# Patient Record
Sex: Female | Born: 1980 | Hispanic: Yes | Marital: Single | Smoking: Never smoker
Health system: Southern US, Community
[De-identification: ages and names within clinical notes are randomized; demographics above are authoritative.]

## PROBLEM LIST (undated history)

## (undated) DIAGNOSIS — Z803 Family history of malignant neoplasm of breast: Secondary | ICD-10-CM

## (undated) DIAGNOSIS — M179 Osteoarthritis of knee, unspecified: Secondary | ICD-10-CM

## (undated) DIAGNOSIS — E669 Obesity, unspecified: Secondary | ICD-10-CM

## (undated) DIAGNOSIS — R87629 Unspecified abnormal cytological findings in specimens from vagina: Secondary | ICD-10-CM

## (undated) DIAGNOSIS — Z8049 Family history of malignant neoplasm of other genital organs: Secondary | ICD-10-CM

## (undated) DIAGNOSIS — S83512A Sprain of anterior cruciate ligament of left knee, initial encounter: Secondary | ICD-10-CM

## (undated) DIAGNOSIS — D219 Benign neoplasm of connective and other soft tissue, unspecified: Secondary | ICD-10-CM

## (undated) DIAGNOSIS — Z973 Presence of spectacles and contact lenses: Secondary | ICD-10-CM

## (undated) DIAGNOSIS — I10 Essential (primary) hypertension: Secondary | ICD-10-CM

## (undated) DIAGNOSIS — G43909 Migraine, unspecified, not intractable, without status migrainosus: Secondary | ICD-10-CM

## (undated) DIAGNOSIS — M1710 Unilateral primary osteoarthritis, unspecified knee: Secondary | ICD-10-CM

## (undated) DIAGNOSIS — T7840XA Allergy, unspecified, initial encounter: Secondary | ICD-10-CM

## (undated) DIAGNOSIS — K589 Irritable bowel syndrome without diarrhea: Secondary | ICD-10-CM

## (undated) DIAGNOSIS — E282 Polycystic ovarian syndrome: Secondary | ICD-10-CM

## (undated) DIAGNOSIS — Z8481 Family history of carrier of genetic disease: Secondary | ICD-10-CM

## (undated) DIAGNOSIS — S83207A Unspecified tear of unspecified meniscus, current injury, left knee, initial encounter: Secondary | ICD-10-CM

## (undated) DIAGNOSIS — K219 Gastro-esophageal reflux disease without esophagitis: Secondary | ICD-10-CM

## (undated) DIAGNOSIS — M1990 Unspecified osteoarthritis, unspecified site: Secondary | ICD-10-CM

## (undated) DIAGNOSIS — E559 Vitamin D deficiency, unspecified: Secondary | ICD-10-CM

## (undated) DIAGNOSIS — Z8742 Personal history of other diseases of the female genital tract: Secondary | ICD-10-CM

## (undated) DIAGNOSIS — Z1371 Encounter for nonprocreative screening for genetic disease carrier status: Secondary | ICD-10-CM

## (undated) DIAGNOSIS — Z8619 Personal history of other infectious and parasitic diseases: Secondary | ICD-10-CM

## (undated) HISTORY — DX: Unspecified osteoarthritis, unspecified site: M19.90

## (undated) HISTORY — DX: Irritable bowel syndrome, unspecified: K58.9

## (undated) HISTORY — DX: Encounter for nonprocreative screening for genetic disease carrier status: Z13.71

## (undated) HISTORY — DX: Family history of malignant neoplasm of breast: Z80.3

## (undated) HISTORY — DX: Family history of carrier of genetic disease: Z84.81

## (undated) HISTORY — DX: Benign neoplasm of connective and other soft tissue, unspecified: D21.9

## (undated) HISTORY — DX: Allergy, unspecified, initial encounter: T78.40XA

## (undated) HISTORY — DX: Family history of malignant neoplasm of other genital organs: Z80.49

## (undated) HISTORY — PX: CERVICAL BIOPSY  W/ LOOP ELECTRODE EXCISION: SUR135

## (undated) HISTORY — DX: Vitamin D deficiency, unspecified: E55.9

## (undated) HISTORY — DX: Gastro-esophageal reflux disease without esophagitis: K21.9

## (undated) HISTORY — DX: Obesity, unspecified: E66.9

## (undated) HISTORY — DX: Unspecified abnormal cytological findings in specimens from vagina: R87.629

## (undated) HISTORY — DX: Essential (primary) hypertension: I10

## (undated) HISTORY — DX: Polycystic ovarian syndrome: E28.2

## (undated) HISTORY — PX: WISDOM TOOTH EXTRACTION: SHX21

---
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
## 2003-11-11 ENCOUNTER — Emergency Department (HOSPITAL_COMMUNITY): Admission: EM | Admit: 2003-11-11 | Discharge: 2003-11-11 | Payer: Self-pay | Admitting: *Deleted

## 2004-08-24 ENCOUNTER — Ambulatory Visit (HOSPITAL_COMMUNITY): Admission: RE | Admit: 2004-08-24 | Discharge: 2004-08-24 | Payer: Self-pay | Admitting: Obstetrics and Gynecology

## 2004-11-14 ENCOUNTER — Inpatient Hospital Stay (HOSPITAL_COMMUNITY): Admission: AD | Admit: 2004-11-14 | Discharge: 2004-11-18 | Payer: Self-pay | Admitting: Obstetrics and Gynecology

## 2004-11-24 ENCOUNTER — Emergency Department (HOSPITAL_COMMUNITY): Admission: EM | Admit: 2004-11-24 | Discharge: 2004-11-25 | Payer: Self-pay | Admitting: *Deleted

## 2004-12-20 ENCOUNTER — Ambulatory Visit (HOSPITAL_COMMUNITY): Admission: RE | Admit: 2004-12-20 | Discharge: 2004-12-20 | Payer: Self-pay | Admitting: General Surgery

## 2004-12-20 ENCOUNTER — Encounter (INDEPENDENT_AMBULATORY_CARE_PROVIDER_SITE_OTHER): Payer: Self-pay | Admitting: General Surgery

## 2004-12-20 HISTORY — PX: LAPAROSCOPIC CHOLECYSTECTOMY: SUR755

## 2004-12-28 ENCOUNTER — Emergency Department (HOSPITAL_COMMUNITY): Admission: EM | Admit: 2004-12-28 | Discharge: 2004-12-29 | Payer: Self-pay | Admitting: Emergency Medicine

## 2005-04-11 ENCOUNTER — Other Ambulatory Visit: Admission: RE | Admit: 2005-04-11 | Discharge: 2005-04-11 | Payer: Self-pay | Admitting: Obstetrics and Gynecology

## 2005-05-18 ENCOUNTER — Other Ambulatory Visit: Admission: RE | Admit: 2005-05-18 | Discharge: 2005-05-18 | Payer: Self-pay | Admitting: Obstetrics and Gynecology

## 2006-11-21 ENCOUNTER — Other Ambulatory Visit: Admission: RE | Admit: 2006-11-21 | Discharge: 2006-11-21 | Payer: Self-pay | Admitting: Obstetrics and Gynecology

## 2006-11-22 ENCOUNTER — Ambulatory Visit (HOSPITAL_COMMUNITY): Admission: RE | Admit: 2006-11-22 | Discharge: 2006-11-22 | Payer: Self-pay | Admitting: Obstetrics and Gynecology

## 2007-09-04 ENCOUNTER — Encounter: Admission: RE | Admit: 2007-09-04 | Discharge: 2007-09-04 | Payer: Self-pay | Admitting: Obstetrics and Gynecology

## 2008-01-04 ENCOUNTER — Emergency Department (HOSPITAL_COMMUNITY): Admission: EM | Admit: 2008-01-04 | Discharge: 2008-01-04 | Payer: Self-pay | Admitting: Pediatrics

## 2008-04-06 ENCOUNTER — Other Ambulatory Visit: Admission: RE | Admit: 2008-04-06 | Discharge: 2008-04-06 | Payer: Self-pay | Admitting: Obstetrics & Gynecology

## 2008-07-11 ENCOUNTER — Emergency Department (HOSPITAL_COMMUNITY): Admission: EM | Admit: 2008-07-11 | Discharge: 2008-07-11 | Payer: Self-pay | Admitting: Emergency Medicine

## 2008-10-08 ENCOUNTER — Emergency Department (HOSPITAL_COMMUNITY): Admission: EM | Admit: 2008-10-08 | Discharge: 2008-10-08 | Payer: Self-pay | Admitting: Emergency Medicine

## 2009-04-24 ENCOUNTER — Emergency Department (HOSPITAL_COMMUNITY): Admission: EM | Admit: 2009-04-24 | Discharge: 2009-04-24 | Payer: Self-pay | Admitting: Emergency Medicine

## 2009-12-28 ENCOUNTER — Other Ambulatory Visit: Admission: RE | Admit: 2009-12-28 | Discharge: 2009-12-28 | Payer: Self-pay | Admitting: Obstetrics and Gynecology

## 2010-05-31 LAB — URINALYSIS, ROUTINE W REFLEX MICROSCOPIC
Nitrite: NEGATIVE
Protein, ur: NEGATIVE mg/dL
Urobilinogen, UA: 0.2 mg/dL (ref 0.0–1.0)

## 2010-05-31 LAB — WET PREP, GENITAL: Yeast Wet Prep HPF POC: NONE SEEN

## 2010-05-31 LAB — URINE MICROSCOPIC-ADD ON

## 2010-05-31 LAB — GC/CHLAMYDIA PROBE AMP, GENITAL: Chlamydia, DNA Probe: NEGATIVE

## 2010-05-31 LAB — PREGNANCY, URINE: Preg Test, Ur: NEGATIVE

## 2010-06-18 LAB — DIFFERENTIAL
Basophils Relative: 0 % (ref 0–1)
Eosinophils Absolute: 0 10*3/uL (ref 0.0–0.7)
Eosinophils Relative: 0 % (ref 0–5)
Monocytes Absolute: 0.5 10*3/uL (ref 0.1–1.0)
Monocytes Relative: 5 % (ref 3–12)

## 2010-06-18 LAB — COMPREHENSIVE METABOLIC PANEL
ALT: 70 U/L — ABNORMAL HIGH (ref 0–35)
Albumin: 4.1 g/dL (ref 3.5–5.2)
Alkaline Phosphatase: 52 U/L (ref 39–117)
Potassium: 3.8 mEq/L (ref 3.5–5.1)
Sodium: 139 mEq/L (ref 135–145)
Total Protein: 7.5 g/dL (ref 6.0–8.3)

## 2010-06-18 LAB — CBC
Hemoglobin: 12.6 g/dL (ref 12.0–15.0)
Platelets: 258 10*3/uL (ref 150–400)
RDW: 14.2 % (ref 11.5–15.5)

## 2010-07-02 ENCOUNTER — Emergency Department (HOSPITAL_COMMUNITY)
Admission: EM | Admit: 2010-07-02 | Discharge: 2010-07-02 | Disposition: A | Discharge status: Home / Self Care | Payer: Medicaid Other | Attending: Emergency Medicine | Admitting: Emergency Medicine

## 2010-07-02 DIAGNOSIS — N390 Urinary tract infection, site not specified: Secondary | ICD-10-CM | POA: Insufficient documentation

## 2010-07-02 DIAGNOSIS — G43909 Migraine, unspecified, not intractable, without status migrainosus: Secondary | ICD-10-CM | POA: Insufficient documentation

## 2010-07-02 LAB — URINALYSIS, ROUTINE W REFLEX MICROSCOPIC
Glucose, UA: NEGATIVE mg/dL
pH: 6 (ref 5.0–8.0)

## 2010-07-02 LAB — URINE MICROSCOPIC-ADD ON

## 2010-07-28 NOTE — H&P (Signed)
NAMESOREN, PIGMAN NO.:  [id number]   MEDICAL RECORD NO.:  [id number]          PATIENT TYPE:  OIB   LOCATION:  LDR3                          FACILITY:  [facility]   PHYSICIAN:  Lazaro Arms, M.D.   DATE OF BIRTH:  [birth year]   DATE OF ADMISSION:  11/14/2004  DATE OF DISCHARGE:  LH                                HISTORY & PHYSICAL   Kathy White came in for her regularly scheduled prenatal visit today and was noted  to have elevated blood pressure and proteinuria. She has been normotensive  throughout the pregnancy and only had trace protein in her urine until  today.  Blood pressures have typically been 100s-130/70s-80s.  She had blood  pressure in the office of 142/100, blood pressures here at the hospital 170s  over anywhere from 105 to 122.  Urine dip in the office was 3+ protein.  She  has 2+ pitting edema with 2-3+ DTRs.  She denies any headache or visual  changes.   PAST MEDICAL HISTORY:  Positive for HPV.   PAST SURGICAL HISTORY:  Wisdom teeth removed.   No known drug allergies.   SOCIAL HISTORY:  Works third shift.  I am not sure where.   FAMILY HISTORY:  Breast cancer and diabetes.   PRENATAL LABS:  Blood type A positive, rubella immune, HB, SAG, HIV, RPR,  gonorrhea, chlamydia and group B strep are all negative.  One-hour GTT was  normal at 120.  She is negative for type I and II HSV.  Last week's  gonorrhea and chlamydia were also negative.   PHYSICAL EXAMINATION:  HEENT:  Within normal limits.  No visual changes or  headache specifically.  HEART:  Regular rate and rhythm.  LUNGS:  Clear.  ABDOMEN:  Soft and nontender with a 35-cm fundal height.  Fetal heart rate  is reactive without deceleration.  PELVIC:  Cervix is tight fingertip, very long, minus one station, vertex  presentation.  EXTREMITIES:  DTRs are 2-3+ with 2+ edema halfway to the knee.   IMPRESSION:  Intrauterine pregnancy at 36 weeks 1 day, probable  preeclampsia.   PLAN:  Dr. Despina Hidden  was consulted.  We are starting her on magnesium sulfate for  seizure prophylaxis.  We are going to try to get her blood pressure down  with IV labetalol.  A 24-hour urine was started at 10:30 in the morning, and  other labs are pending.      Jacklyn Shell, C.N.M.      Lazaro Arms, M.D.  Electronically Signed    FC/MEDQ  D:  11/14/2004  T:  11/14/2004  Job:  [id number]   cc:   [facility]

## 2010-07-28 NOTE — Op Note (Signed)
NAMEMARIANA, Kathy White               ACCOUNT NO.:  [id number]   MEDICAL RECORD NO.:  [id number]          PATIENT TYPE:  INP   LOCATION:  A412                          FACILITY:  [facility]   PHYSICIAN:  Lazaro Arms, M.D.   DATE OF BIRTH:  Nov 29, 1980   DATE OF PROCEDURE:  11/14/2004  DATE OF DISCHARGE:                                 OPERATIVE REPORT   PREOPERATIVE DIAGNOSES:  1.  Intrauterine pregnancy at 36-1/[redacted] weeks gestation.  2.  Severe preeclampsia.  3.  Unfavorable cervix, remote from delivery.   POSTOPERATIVE DIAGNOSES:  1.  Intrauterine pregnancy at 36-1/[redacted] weeks gestation.  2.  Severe preeclampsia.  3.  Unfavorable cervix, remote from delivery.   OPERATION/PROCEDURE:  Primary low transverse cesarean section.   SURGEON:  Lazaro Arms, M.D.   ANESTHESIA:  Spinal.   FINDINGS:  [name] is a 30 year old female, G1, P0, who was admitted earlier  today with elevated blood pressure and 3+ protein on dip urine.  She was  placed on magnesium sulfate seizure prophylaxis.  A 24-hour urine was begun  and labs were drawn.  Her blood pressure was elevated at 160/110 in the  office but here after a couple of doses of labetalol, it settled down pretty  well and was acceptable.  However, between about 1800 and 2100 this evening,  her blood pressures continued to be elevated and no longer responded to  labetalol.  As a result, with her cervix being woefully unfavorable, being  nulliparous, and remote from delivery, probably at least 12 to 16 hours from  delivery, I decided the best course of action was to go ahead and perform a  primary cesarean section.  I informed the patient of this and she concurred.  Over a low transverse hysterotomy incision was delivered a viable female at  2252 with Apgars of 9 and 9 with weight 4-1/2 pounds.  There was three-  vessel cord.  Cord blood and cod gas was sent.  There was adequate fluid but  certainly not anything excessive.  Her placenta was aged, grade  3.  The  uterus, tubes and ovaries were otherwise normal.  Dr. Milinda Cave was present  for routine neonatal resuscitation.   DESCRIPTION OF PROCEDURE:  The patient was taken to the operating room,  placed in the sitting position where he underwent a spinal anesthetic.  She  was then placed in the supine position with a roll under her right hip.  She  was shaved-prepped with an Land.  A Foley was placed.  She was  prepped and draped in the usual sterile fashion.   A Pfannenstiel skin incision was made and carried down sharply to the rectus  fascia which was scored in the midline and extended laterally.  The fascia  was taken off the muscles, superiorly and inferiorly, without difficulty.  The muscles were divided.  The peritoneal cavity was entered.  A bladder  blade was placed.  The vesicouterine serosa flap was created.  A low  transverse hysterotomy incision was made.  Over this incision was delivered  a viable female infant at  2352 with  Apgars of  9 and 9, weighing 4 pounds and  8 ounces. There was a three-vessel cord. Cord blood and cord gas were sent.  Placenta was sent to pathology for routine evaluation.  The uterus was  exteriorized, wiped clean with clean lap pad.  The uterus was closed in two  layers, first being a running interlocking layer, the second being an  imbricating layer.  There was good hemostasis.  The uterus was replaced in  the peritoneal cavity.  Both pericolic gutters were irrigated and the  uterine incision was found to be hemostatic.  Muscles were reapproximated  loosely.  The fascia was closed using 0 Vicryl running.  Subcutaneous tissue  was made hemostatic and irrigated.  Skin was closed using skin staples.  The  patient tolerated the procedure well.  She experienced about 500 mL of blood  loss and was taken to the recovery room in good stable condition.  All  counts were correct x3.      Lazaro Arms, M.D.  Electronically Signed      LHE/MEDQ  D:  11/14/2004  T:  11/15/2004  Job:  [id number]

## 2010-07-28 NOTE — H&P (Signed)
Kathy White, Kathy White               ACCOUNT NO.:  [id number]   MEDICAL RECORD NO.:  [id number]          PATIENT TYPE:  AMB   LOCATION:  DAY                           FACILITY:  [facility]   PHYSICIAN:  Dalia Heading, M.D.  DATE OF BIRTH:  [birth year]   DATE OF ADMISSION:  12/25/2004  DATE OF DISCHARGE:  LH                                HISTORY & PHYSICAL   CHIEF COMPLAINT:  Cholecystitis, cholelithiasis.   HISTORY OF PRESENT ILLNESS:  The patient is a 30 year old white female who  is referred for evaluation and treatment of biliary colic secondary to  cholelithiasis.  She was pregnant while she was having right upper quadrant  abdominal pain with radiation to the right flank, nausea, and bloating.  She  has since delivered her baby and would like to proceed with surgery.   PAST MEDICAL HISTORY:  Unremarkable.   PAST SURGICAL HISTORY:  Unremarkable.   CURRENT MEDICATIONS:  None.   ALLERGIES:  No known drug allergies.   REVIEW OF SYSTEMS:  Noncontributory.   PHYSICAL EXAMINATION:  GENERAL:  The patient is a well-developed, well-  nourished, white female in no acute distress.  HEENT:  No scleral icterus.  LUNGS:  Clear to auscultation with equal breath sounds bilaterally.  HEART:  Regular rate and rhythm without S3, S4, or murmurs.  ABDOMEN:  Soft, nontender, and nondistended.  No hepatosplenomegaly or  masses are noted.   Ultrasound of the gallbladder reveals cholelithiasis without evidence of  cholecystitis.   IMPRESSION:  Cholelithiasis.   PLAN:  The patient is scheduled for laparoscopic cholecystectomy on December 25, 2004.  The risks and benefits of the procedure including bleeding,  infection, hepatobiliary injury, and the possibility of an open procedure  were fully explained to the patient, who gave informed consent.      Dalia Heading, M.D.  Electronically Signed     MAJ/MEDQ  D:  12/19/2004  T:  12/19/2004  Job:  [id number]   cc:   Short Stay, [facility]   Tilda Burrow, M.D.  Fax: [phone]

## 2010-07-28 NOTE — Op Note (Signed)
NAME:  Kathy White, Kathy White NO.:  [id number]   MEDICAL RECORD NO.:  [id number]          PATIENT TYPE:  EMS   LOCATION:  ED                            FACILITY:  [facility]   PHYSICIAN:  Langley Gauss, MD     DATE OF BIRTH:  [birth year]   DATE OF PROCEDURE:  11/24/2004  DATE OF DISCHARGE:  11/25/2004                                 OPERATIVE REPORT   TELEPHONE CONVERSATION:  Date of contact was November 24, 2004. The patient  receives care from [facility], contacted by the nursing staff on the  fourth floor regarding Latresa Biller who was status post low transverse  cesarean section performed by Dr. Despina Hidden on November 20, 2004, presumably for  emergency. The patient states that she was seen in their office on November 24, 2004 at which time a hematoma was evacuated and packing was placed. The  patient did not give any history of what plans for follow-up were regarding  this. When I contacted promptly, she stated that she was noticing blood  oozing from beneath the dressing. I thought this was excessive. With the  concern for ongoing active bleeding, I advised the patient to present to  emergency room for evaluation. Subsequently did not have any contact from  the emergency room. Thus, I cannot make a judgment as to whether she  presented to the emergency room for evaluation, nor do I have any  information regarding ongoing plans for care.      Langley Gauss, MD  Electronically Signed     DC/MEDQ  D:  11/25/2004  T:  11/25/2004  Job:  [id number]

## 2010-07-28 NOTE — Op Note (Signed)
Kathy White, Kathy White               ACCOUNT NO.:  [id number]   MEDICAL RECORD NO.:  [id number]          PATIENT TYPE:  AMB   LOCATION:  DAY                           FACILITY:  [facility]   PHYSICIAN:  Dalia Heading, M.D.  DATE OF BIRTH:  1980/12/04   DATE OF PROCEDURE:  12/20/2004  DATE OF DISCHARGE:                                 OPERATIVE REPORT   PREOPERATIVE DIAGNOSIS:  Right cholecystitis, cholelithiasis.   POSTOPERATIVE DIAGNOSIS:  Right cholecystitis, cholelithiasis.   PROCEDURE:  Laparoscopic cholecystectomy.   SURGEON:  Dr. Franky Macho.   ASSISTANT:  Dr. Arna Snipe.   ANESTHESIA:  General endotracheal.   INDICATIONS:  The patient is a 30 year old white female who presents with  cholecystitis secondary cholelithiasis. Risks and benefits of the procedure  including bleeding, infection, hepatobiliary injury, and the possibility of  an open procedure were fully explained to the patient, who gave informed  consent.   PROCEDURE NOTE:  The patient was placed in the supine position. After  induction of general endotracheal anesthesia, the abdomen was prepped and  draped using the usual sterile technique with Betadine. Surgical site  confirmation was performed.   An infraumbilical incision was made down to the fascia. Veress needle was  introduced into the abdominal cavity, and confirmation of placement was done  using the saline drop test. The abdomen was then insufflated to 16 mmHg  pressure. An 11-mm trocar was introduced into the abdominal cavity under  direct visualization without difficulty. The patient was placed in reversed  Trendelenburg position. An additional 11-mm trocar was placed in epigastric  region, and 5-mm trocars were placed in the right upper quadrant and right  flank regions. Liver was inspected and noted to normal limits. The  gallbladder was retracted superiorly and laterally. Dissection was begun  around the infundibulum of the gallbladder. The  cystic duct was first  identified. Its juncture to the infundibulum fully identified. Endoclips  were placed proximally and distally on the cystic duct, and the cystic duct  was divided. This was likewise done to the cystic artery. The gallbladder  was then freed away from the gallbladder fossa using Bovie electrocautery.  The gallbladder was delivered through the epigastric trocar site using an  EndoCatch bag. The gallbladder fossa was inspected, and no abnormal bleeding  or bile leakage was noted. Surgicel was placed in the gallbladder fossa. All  fluid and air were then evacuated from the abdominal cavity prior to removal  of the trocars.   All wounds were irrigated with normal saline. All wounds were injected with  0.5% Sensorcaine. The infraumbilical fascia was reapproximated using a 0  Vicryl interrupted suture. All skin incisions were closed using staples.  Betadine ointment and dry sterile dressings were applied.   All tape and needle counts were correct at the end of the procedure. The  patient was extubated in the operating room and went back to recovery room  awake in stable condition.   COMPLICATIONS:  None.   SPECIMEN:  Gallbladder with stones.   BLOOD LOSS:  Minimal.      Dalia Heading,  M.D.  Electronically Signed     MAJ/MEDQ  D:  12/20/2004  T:  12/20/2004  Job:  [id number]   cc:   Tilda Burrow, M.D.  Fax: [phone]

## 2010-09-14 ENCOUNTER — Emergency Department (HOSPITAL_COMMUNITY)
Admission: EM | Admit: 2010-09-14 | Discharge: 2010-09-14 | Disposition: A | Discharge status: Home / Self Care | Payer: Medicaid Other | Attending: Emergency Medicine | Admitting: Emergency Medicine

## 2010-09-14 DIAGNOSIS — L089 Local infection of the skin and subcutaneous tissue, unspecified: Secondary | ICD-10-CM | POA: Insufficient documentation

## 2010-09-14 DIAGNOSIS — Y92009 Unspecified place in unspecified non-institutional (private) residence as the place of occurrence of the external cause: Secondary | ICD-10-CM | POA: Insufficient documentation

## 2010-11-27 ENCOUNTER — Encounter: Payer: Self-pay | Admitting: *Deleted

## 2010-11-27 ENCOUNTER — Emergency Department (HOSPITAL_COMMUNITY)
Admission: EM | Admit: 2010-11-27 | Discharge: 2010-11-27 | Disposition: A | Discharge status: Home / Self Care | Payer: Medicaid Other | Attending: Emergency Medicine | Admitting: Emergency Medicine

## 2010-11-27 ENCOUNTER — Emergency Department (HOSPITAL_COMMUNITY): Payer: Medicaid Other

## 2010-11-27 DIAGNOSIS — R51 Headache: Secondary | ICD-10-CM

## 2010-11-27 LAB — CBC
HCT: 39.1 % (ref 36.0–46.0)
RDW: 13.9 % (ref 11.5–15.5)
WBC: 9.5 10*3/uL (ref 4.0–10.5)

## 2010-11-27 LAB — BASIC METABOLIC PANEL
BUN: 7 mg/dL (ref 6–23)
Chloride: 103 mEq/L (ref 96–112)
GFR calc Af Amer: >60 mL/min (ref >60)
Potassium: 4.5 mEq/L (ref 3.5–5.1)

## 2010-11-27 MED ORDER — GADOBENATE DIMEGLUMINE 529 MG/ML IV SOLN
20.0000 mL | Freq: Once | INTRAVENOUS | Status: AC | PRN
  Administered 2010-11-27: 20 mL via INTRAVENOUS

## 2010-11-27 MED ORDER — MORPHINE SULFATE 4 MG/ML IJ SOLN
4.0000 mg | Freq: Once | INTRAMUSCULAR | Status: AC
  Administered 2010-11-27: 4 mg via INTRAVENOUS
  Filled 2010-11-27: qty 1

## 2010-11-27 MED ORDER — OXYCODONE-ACETAMINOPHEN 5-325 MG PO TABS: 2.0000 | ORAL_TABLET | ORAL | Status: AC | PRN

## 2010-11-27 MED ORDER — MORPHINE SULFATE 4 MG/ML IJ SOLN: 4.0000 mg | Freq: Once | INTRAMUSCULAR | Status: DC

## 2010-11-27 NOTE — ED Provider Notes (Signed)
Scribed for Kathy Stairs, MD, the patient was seen in room APA04/APA04 . This chart was scribed by Ellie Lunch. This patient's care was started at 1:03 PM.   CSN: [id number] Arrival date & time: 11/27/2010 11:10 AM   Chief Complaint  Patient presents with  . Headache     (Include location/radiation/quality/duration/timing/severity/associated sxs/prior treatment) HPI Kathy White is a 30 y.o. female who presents to the Emergency Department complaining of headache on left temporal region that radiated down the left side of her face starting this morning.  Pain is described as constant with a pressure quality. Pt reports associated dizziness described as a spinning sensation, n/vx1, and slight neck soreness. Denies fever, photophobia, rash. Treated HA with Ib profen with little improvement. Pt reports a hx of similar sx but has not sought out treatment before.    Past Medical History  Diagnosis Date  . Worsening headaches     Past Surgical History  Procedure Date  . Cholecystectomy   . Cesarean section     History reviewed. No pertinent family history.  History  Substance Use Topics  . Smoking status: Never Smoker   . Smokeless tobacco: Not on file  . Alcohol Use: No    Review of Systems 10 Systems reviewed and are negative for acute change except as noted in the HPI.  Allergies  Review of patient's allergies indicates no known allergies.  Home Medications   Current Outpatient Rx  Name Route Sig Dispense Refill  . IBUPROFEN 200 MG PO TABS Oral Take 600 mg by mouth every 6 (six) hours as needed. For headaches     . NORGESTIMATE-ETH ESTRADIOL 0.25-35 MG-MCG PO TABS Oral Take 1 tablet by mouth daily.        Physical Exam    BP 156/107  Pulse 83  Temp(Src) 98.8 F (37.1 C) (Oral)  Resp 20  Ht 5\' 2"  (1.575 m)  Wt 224 lb (101.606 kg)  BMI 40.97 kg/m2  SpO2 98%  LMP 10/27/2010  Physical Exam  Nursing note and vitals reviewed. Constitutional: She is  oriented to person, place, and time. She appears well-developed and well-nourished. No distress.  HENT:  Head: Normocephalic and atraumatic.  Eyes: EOM are normal.  Neck: Normal range of motion. Neck supple.  Cardiovascular: Normal rate, regular rhythm and normal heart sounds.   Pulmonary/Chest: Effort normal and breath sounds normal.       Lungs clear and equal bilaterally.   Abdominal: Soft. Bowel sounds are normal. She exhibits no distension. There is no tenderness.  Musculoskeletal: Normal range of motion.  Neurological: She is alert and oriented to person, place, and time. She has normal strength. No cranial nerve deficit. She displays a negative Romberg sign. Coordination normal.       Finger to nose normal  Skin: Skin is warm and dry.  Psychiatric: She has a normal mood and affect.   Procedures  OTHER DATA REVIEWED: Nursing notes, vital signs, and past medical records reviewed.  DIAGNOSTIC STUDIES: Oxygen Saturation is 98% on room air, normal by my interpretation.    LABS / RADIOLOGY:  Results for orders placed during the hospital encounter of 11/27/10  CBC      Component Value Range   WBC 9.5  4.0 - 10.5 (K/uL)   RBC 4.59  3.87 - 5.11 (MIL/uL)   Hemoglobin 12.7  12.0 - 15.0 (g/dL)   HCT 40.9  81.1 - 91.4 (%)   MCV 85.2  78.0 - 100.0 (fL)   MCH  27.7  26.0 - 34.0 (pg)   MCHC 32.5  30.0 - 36.0 (g/dL)   RDW 16.1  09.6 - 04.5 (%)   Platelets 319  150 - 400 (K/uL)  BASIC METABOLIC PANEL      Component Value Range   Sodium 140  135 - 145 (mEq/L)   Potassium 4.5  3.5 - 5.1 (mEq/L)   Chloride 103  96 - 112 (mEq/L)   CO2 26  19 - 32 (mEq/L)   Glucose, Bld 89  70 - 99 (mg/dL)   BUN 7  6 - 23 (mg/dL)   Creatinine, Ser 4.09  0.50 - 1.10 (mg/dL)   Calcium 9.4  8.4 - 81.1 (mg/dL)   GFR calc non Af Amer >60  >60 (mL/min)   GFR calc Af Amer >60  >60 (mL/min)    Mr Laqueta Jean Wo Contrast  11/27/2010  *RADIOLOGY REPORT*  Clinical Data: Headaches with dizziness.  Nausea and  vomiting. Oral contraceptive views.  Question cavernous sinus thrombosis. Left temporal headaches.  MRI HEAD WITHOUT AND WITH CONTRAST  Technique:  Multiplanar, multiecho pulse sequences of the brain and surrounding structures were obtained according to standard protocol without and with intravenous contrast  Contrast: 20mL MULTIHANCE GADOBENATE DIMEGLUMINE 529 MG/ML IV SOLN  Comparison: None.  Findings: No acute intracranial abnormalities are present. Specifically, there is no evidence for acute infarct, hemorrhage, mass, hydrocephalus, or significant extra-axial fluid collection. Flow is present in the major intracranial arteries.  The cavernous sinuses within normal limits.  There is normal enhancement of the dural sinuses.  There is no evidence for thrombosis. The postcontrast images demonstrate no areas of pathologic enhancement.  The globes and orbits are intact.  The paranasal sinuses and mastoid air cells are clear.  IMPRESSION: Negative MRI of the brain.  Original Report Authenticated By: Jamesetta Orleans. MATTERN, M.D.    ED COURSE / COORDINATION OF CARE: 13:03 EDP at PT bedside and ordered the following: Orders Placed This Encounter  Procedures  . MR Brain W Wo Contrast  . CBC  . Basic metabolic panel   9:14 PM HA resolved now.  No.  Neurological deficit.  Normal mental status.  No signs of CNS infection or systemic infection.   MDM: headache  SCRIBE ATTESTATION:I personally performed the services described in this documentation, which was scribed in my presence. The recorded information has been reviewed and considered. Kathy Stairs, MD        Kathy Stairs, MD 11/27/10 [phone]

## 2010-11-27 NOTE — ED Notes (Signed)
Headache onset this am. Pain on left temporal area, states she has a history of headaches

## 2010-11-27 NOTE — ED Notes (Signed)
Pt c/o headache since this am. Pt states she gets these headaches often and tried a cold cloth and ibuprofen with no relief. Pt states she is dizzy and vomited once this am.

## 2011-08-22 ENCOUNTER — Other Ambulatory Visit: Payer: Self-pay | Admitting: Obstetrics and Gynecology

## 2011-08-22 DIAGNOSIS — Z1231 Encounter for screening mammogram for malignant neoplasm of breast: Secondary | ICD-10-CM

## 2011-08-22 DIAGNOSIS — Z803 Family history of malignant neoplasm of breast: Secondary | ICD-10-CM

## 2011-08-24 ENCOUNTER — Ambulatory Visit
Admission: RE | Admit: 2011-08-24 | Discharge: 2011-08-24 | Disposition: A | Discharge status: Home / Self Care | Payer: Medicaid Other | Source: Ambulatory Visit | Attending: Obstetrics and Gynecology | Admitting: Obstetrics and Gynecology

## 2011-08-24 DIAGNOSIS — Z1231 Encounter for screening mammogram for malignant neoplasm of breast: Secondary | ICD-10-CM

## 2011-08-24 DIAGNOSIS — Z803 Family history of malignant neoplasm of breast: Secondary | ICD-10-CM

## 2012-01-09 ENCOUNTER — Other Ambulatory Visit: Payer: Self-pay | Admitting: Adult Health

## 2012-01-09 ENCOUNTER — Other Ambulatory Visit (HOSPITAL_COMMUNITY)
Admission: RE | Admit: 2012-01-09 | Discharge: 2012-01-09 | Disposition: A | Discharge status: Home / Self Care | Payer: Medicaid Other | Source: Ambulatory Visit | Attending: Obstetrics and Gynecology | Admitting: Obstetrics and Gynecology

## 2012-01-09 DIAGNOSIS — Z01419 Encounter for gynecological examination (general) (routine) without abnormal findings: Secondary | ICD-10-CM | POA: Insufficient documentation

## 2012-01-22 ENCOUNTER — Ambulatory Visit: Payer: Medicaid Other | Admitting: Urology

## 2012-07-09 ENCOUNTER — Telehealth: Payer: Self-pay | Admitting: Adult Health

## 2012-07-10 NOTE — Telephone Encounter (Signed)
Need refill on HCTZ make appt. To be seen first

## 2012-07-10 NOTE — Telephone Encounter (Signed)
Pt states Cyril Mourning, NP has prescribed "water pill", HCTZ 12.5 mg in the past. Requesting refill. Pharmacy has been updated in Epic.

## 2012-07-31 ENCOUNTER — Encounter: Payer: Self-pay | Admitting: *Deleted

## 2012-07-31 DIAGNOSIS — E669 Obesity, unspecified: Secondary | ICD-10-CM | POA: Insufficient documentation

## 2012-07-31 DIAGNOSIS — G43909 Migraine, unspecified, not intractable, without status migrainosus: Secondary | ICD-10-CM

## 2012-08-01 ENCOUNTER — Encounter: Payer: Self-pay | Admitting: Adult Health

## 2012-08-01 ENCOUNTER — Ambulatory Visit (INDEPENDENT_AMBULATORY_CARE_PROVIDER_SITE_OTHER): Payer: Medicaid Other | Admitting: Adult Health

## 2012-08-01 VITALS — BP 128/78 | Ht 62.0 in | Wt 243.5 lb

## 2012-08-01 DIAGNOSIS — M25472 Effusion, left ankle: Secondary | ICD-10-CM | POA: Insufficient documentation

## 2012-08-01 DIAGNOSIS — E669 Obesity, unspecified: Secondary | ICD-10-CM

## 2012-08-01 DIAGNOSIS — G43909 Migraine, unspecified, not intractable, without status migrainosus: Secondary | ICD-10-CM

## 2012-08-01 DIAGNOSIS — M7989 Other specified soft tissue disorders: Secondary | ICD-10-CM

## 2012-08-01 DIAGNOSIS — M25471 Effusion, right ankle: Secondary | ICD-10-CM | POA: Insufficient documentation

## 2012-08-01 MED ORDER — PROPRANOLOL HCL ER 120 MG PO CP24: 120.0000 mg | ORAL_CAPSULE | Freq: Every day | ORAL | Status: DC

## 2012-08-01 MED ORDER — HYDROCHLOROTHIAZIDE 12.5 MG PO CAPS: 12.5000 mg | ORAL_CAPSULE | Freq: Every day | ORAL | Status: DC

## 2012-08-01 NOTE — Progress Notes (Signed)
Subjective:     Patient ID: Kathy White, female   DOB: [birth year], 32 y.o.   MRN: [id number]  HPI [name] is in today complaining of having swelling in feet and ankles, and has used hydrchlorothiazide in past and it helped and she wants refills.Her PCP is moving and she needs refill on propranolol for her migraines.She has an IUD.  Review of Systems Positives as in HPI   Reviewed past medical,surgical, social and family history. Reviewed medications and allergies.  Objective:   Physical Exam Blood pressure 128/78, height 5\' 2"  (1.575 m), weight 243 lb 8 oz (110.451 kg). Mild swelling in ankles, skin warm and dry.    Assessment:      Swelling in feet and ankles Obesity Migraines    Plan:      RX Propranolol ER 120 mg 1 daily # 30 called to [facility], no refills    RX hydrochlorothiazide 12. 5 mg 1 daily #30 with 11 refills Work on weight Return in 6 months for physical Call prn problems

## 2012-08-01 NOTE — Patient Instructions (Addendum)
Return in 6 months for physical Work on weight loss Sign up for my chart

## 2012-11-21 DIAGNOSIS — R600 Localized edema: Secondary | ICD-10-CM | POA: Insufficient documentation

## 2013-02-17 ENCOUNTER — Other Ambulatory Visit: Payer: Medicaid Other | Admitting: Adult Health

## 2013-02-26 ENCOUNTER — Ambulatory Visit (INDEPENDENT_AMBULATORY_CARE_PROVIDER_SITE_OTHER): Payer: Medicaid Other | Admitting: Adult Health

## 2013-02-26 ENCOUNTER — Encounter: Payer: Self-pay | Admitting: Adult Health

## 2013-02-26 ENCOUNTER — Other Ambulatory Visit (HOSPITAL_COMMUNITY)
Admission: RE | Admit: 2013-02-26 | Discharge: 2013-02-26 | Disposition: A | Discharge status: Home / Self Care | Payer: Medicaid Other | Source: Ambulatory Visit | Attending: Adult Health | Admitting: Adult Health

## 2013-02-26 VITALS — BP 120/86 | HR 78 | Ht 62.5 in | Wt 249.0 lb

## 2013-02-26 DIAGNOSIS — Z1151 Encounter for screening for human papillomavirus (HPV): Secondary | ICD-10-CM | POA: Insufficient documentation

## 2013-02-26 DIAGNOSIS — I10 Essential (primary) hypertension: Secondary | ICD-10-CM

## 2013-02-26 DIAGNOSIS — Z01419 Encounter for gynecological examination (general) (routine) without abnormal findings: Secondary | ICD-10-CM

## 2013-02-26 DIAGNOSIS — Z0000 Encounter for general adult medical examination without abnormal findings: Secondary | ICD-10-CM

## 2013-02-26 DIAGNOSIS — E669 Obesity, unspecified: Secondary | ICD-10-CM

## 2013-02-26 DIAGNOSIS — Z803 Family history of malignant neoplasm of breast: Secondary | ICD-10-CM | POA: Insufficient documentation

## 2013-02-26 DIAGNOSIS — Z975 Presence of (intrauterine) contraceptive device: Secondary | ICD-10-CM | POA: Insufficient documentation

## 2013-02-26 HISTORY — DX: Family history of malignant neoplasm of breast: Z80.3

## 2013-02-26 NOTE — Patient Instructions (Signed)
Follow in 1 year for physical Get mammogram soon Labs with PCP

## 2013-02-26 NOTE — Progress Notes (Signed)
Patient ID: Kathy White, female   DOB: 06-02-1980, 32 y.o.   MRN: [id number] History of Present Illness: Kathy White is a 32 year old Hispanic female single in for pap and physical.Has IUD.   Current Medications, Allergies, Past Medical History, Past Surgical History, Family History and Social History were reviewed in [employer] record.   Past Medical History  Diagnosis Date  . Worsening headaches   . Cancer   . HPV (human papilloma virus) infection   . Obesity   . Abnormal Pap smear   . UTI (lower urinary tract infection)   . Swelling of both ankles 08/01/2012  . IUD (intrauterine device) in place 02/26/2013  . FH: breast cancer in first degree relative 02/26/2013  . Hypertension    Past Surgical History  Procedure Laterality Date  . Cholecystectomy    . Cesarean section    . Cervical biopsy  w/ loop electrode excision    Current outpatient prescriptions:CRANBERRY PO, Take 3,600 mg by mouth daily., Disp: , Rfl: ;  hydrochlorothiazide (MICROZIDE) 12.5 MG capsule, Take 1 capsule (12.5 mg total) by mouth daily., Disp: 30 capsule, Rfl: 11;  levonorgestrel (MIRENA) 20 MCG/24HR IUD, 1 each by Intrauterine route once., Disp: , Rfl: ;  propranolol ER (INDERAL LA) 120 MG 24 hr capsule, Take 1 capsule (120 mg total) by mouth daily., Disp: 30 capsule, Rfl: 0  Review of Systems: Patient denies any headaches, blurred vision, shortness of breath, chest pain, abdominal pain, problems with bowel movements, urination, or intercourse. No joint pain or mood swings, does complain of weight.    Physical Exam:BP 120/86  Pulse 78  Ht 5' 2.5" (1.588 m)  Wt 249 lb (112.946 kg)  BMI 44.79 kg/m2 General:  Well developed, well nourished, no acute distress Skin:  Warm and dry Neck:  Midline trachea, normal thyroid Lungs; Clear to auscultation bilaterally Breast:  No dominant palpable mass, retraction, or nipple discharge Cardiovascular: Regular rate and rhythm Abdomen:  Soft, non  tender, no hepatosplenomegaly,obese Pelvic:  External genitalia is normal in appearance.  The vagina is normal in appearance.  The cervix is bulbous.Pap with HPV performed no IUD strings seen but seen on [place].  Uterus is felt to be normal size, shape, and contour.  No adnexal masses or tenderness noted. Extremities:  No swelling or varicosities noted Psych:  No mood changes,alert and cooperative,seems happy   Impression: Yearly gyn exam  IUD in place History of hypertension Obesity Strong family history of breast cancer    Plan: Get mammogram  Physical in 1 year Labs with PCP

## 2013-03-31 ENCOUNTER — Encounter (INDEPENDENT_AMBULATORY_CARE_PROVIDER_SITE_OTHER): Payer: Self-pay

## 2013-03-31 ENCOUNTER — Encounter: Payer: Self-pay | Admitting: Women's Health

## 2013-03-31 ENCOUNTER — Ambulatory Visit (INDEPENDENT_AMBULATORY_CARE_PROVIDER_SITE_OTHER): Payer: Medicaid Other | Admitting: Women's Health

## 2013-03-31 VITALS — BP 120/90 | Ht 63.0 in | Wt 247.5 lb

## 2013-03-31 DIAGNOSIS — R300 Dysuria: Secondary | ICD-10-CM

## 2013-03-31 DIAGNOSIS — N390 Urinary tract infection, site not specified: Secondary | ICD-10-CM

## 2013-03-31 DIAGNOSIS — I10 Essential (primary) hypertension: Secondary | ICD-10-CM | POA: Insufficient documentation

## 2013-03-31 LAB — POCT URINALYSIS DIPSTICK
Glucose, UA: NEGATIVE
KETONES UA: NEGATIVE
LEUKOCYTES UA: NEGATIVE
Nitrite, UA: NEGATIVE
PROTEIN UA: NEGATIVE

## 2013-03-31 MED ORDER — CIPROFLOXACIN HCL 500 MG PO TABS: 500.0000 mg | ORAL_TABLET | Freq: Two times a day (BID) | ORAL | Status: DC

## 2013-03-31 NOTE — Progress Notes (Signed)
Patient ID: Kathy White, female   DOB: 1980-08-30, 33 y.o.   MRN: [id number]   [facility] Visit  Patient name: Kathy White MRN [id number]  Date of birth: 08-26-1980  CC & HPI:  Kathy White is a 33 y.o. Hispanic female presenting today for report of burning w/ urination and frequency x last few days. Denies urgency, hesitancy, hematuria, fever/chills, flank/back pain. H/O multiple UTIs, states this feels like another one.  Has been taking 3600mg  cranberry pills prophylactically, but recently the store she purchases them from only has 300mg  pills available, so she has decreased her dose.  PCP [facility] in [place]  Pertinent History Reviewed:  Medical & Surgical Hx:   Past Medical History  Diagnosis Date  . Worsening headaches   . Cancer   . HPV (human papilloma virus) infection   . Obesity   . Abnormal Pap smear   . UTI (lower urinary tract infection)   . Swelling of both ankles 08/01/2012  . IUD (intrauterine device) in place 02/26/2013  . FH: breast cancer in first degree relative 02/26/2013  . Hypertension    Past Surgical History  Procedure Laterality Date  . Cholecystectomy    . Cesarean section    . Cervical biopsy  w/ loop electrode excision     Medications: Reviewed & Updated - see associated section Social History: Reviewed -  reports that she has never smoked. She has never used smokeless tobacco.  Objective Findings:  Vitals: BP 120/90  Ht 5\' 3"  (1.6 m)  Wt 247 lb 8 oz (112.265 kg)  BMI 43.85 kg/m2  Physical Examination: General appearance - alert, well appearing, and in no distress  Results for orders placed in visit on 03/31/13 (from the past 24 hour(s))  POCT URINALYSIS DIPSTICK   Collection Time    03/31/13  8:47 AM      Result Value Range   Color, UA       Clarity, UA       Glucose, UA neg     Bilirubin, UA       Ketones, UA neg     Spec Grav, UA       Blood, UA 1+     pH, UA       Protein, UA neg     Urobilinogen, UA       Nitrite, UA neg     Leukocytes, UA Negative      Assessment & Plan:  A:   UTI vs. Bladder calculi P:  Send urine for cx  Will treat for UTI given hx, rx cipro 500mg  BID x 3d  Push po fluids, enough so urine is clear  F/U prn, notify us if sx no better after antbx or if develops new sx   Tawnya Crook CNM, [facility] 03/31/2013 9:11 AM

## 2013-03-31 NOTE — Patient Instructions (Signed)
Urinary Tract Infection Urinary tract infections (UTIs) can develop anywhere along your urinary tract. Your urinary tract is your body's drainage system for removing wastes and extra water. Your urinary tract includes two kidneys, two ureters, a bladder, and a urethra. Your kidneys are a pair of bean-shaped organs. Each kidney is about the size of your fist. They are located below your ribs, one on each side of your spine. CAUSES Infections are caused by microbes, which are microscopic organisms, including fungi, viruses, and bacteria. These organisms are so small that they can only be seen through a microscope. Bacteria are the microbes that most commonly cause UTIs. SYMPTOMS  Symptoms of UTIs may vary by age and gender of the patient and by the location of the infection. Symptoms in young women typically include a frequent and intense urge to urinate and a painful, burning feeling in the bladder or urethra during urination. Older women and men are more likely to be tired, shaky, and weak and have muscle aches and abdominal pain. A fever may mean the infection is in your kidneys. Other symptoms of a kidney infection include pain in your back or sides below the ribs, nausea, and vomiting. DIAGNOSIS To diagnose a UTI, your caregiver will ask you about your symptoms. Your caregiver also will ask to provide a urine sample. The urine sample will be tested for bacteria and white blood cells. White blood cells are made by your body to help fight infection. TREATMENT  Typically, UTIs can be treated with medication. Because most UTIs are caused by a bacterial infection, they usually can be treated with the use of antibiotics. The choice of antibiotic and length of treatment depend on your symptoms and the type of bacteria causing your infection. HOME CARE INSTRUCTIONS  If you were prescribed antibiotics, take them exactly as your caregiver instructs you. Finish the medication even if you feel better after you  have only taken some of the medication.  Drink enough water and fluids to keep your urine clear or pale yellow.  Avoid caffeine, tea, and carbonated beverages. They tend to irritate your bladder.  Empty your bladder often. Avoid holding urine for long periods of time.  Empty your bladder before and after sexual intercourse.  After a bowel movement, women should cleanse from front to back. Use each tissue only once. SEEK MEDICAL CARE IF:   You have back pain.  You develop a fever.  Your symptoms do not begin to resolve within 3 days. SEEK IMMEDIATE MEDICAL CARE IF:   You have severe back pain or lower abdominal pain.  You develop chills.  You have nausea or vomiting.  You have continued burning or discomfort with urination. MAKE SURE YOU:   Understand these instructions.  Will watch your condition.  Will get help right away if you are not doing well or get worse. Document Released: 12/06/2004 Document Revised: 08/28/2011 Document Reviewed: 04/06/2011 [facility] Patient Information 2014 [place].   Kidney Stones Kidney stones (urolithiasis) are deposits that form inside your kidneys. The intense pain is caused by the stone moving through the urinary tract. When the stone moves, the ureter goes into spasm around the stone. The stone is usually passed in the urine.  CAUSES   A disorder that makes certain neck glands produce too much parathyroid hormone (primary hyperparathyroidism).  A buildup of uric acid crystals, similar to gout in your joints.  Narrowing (stricture) of the ureter.  A kidney obstruction present at birth (congenital obstruction).  Previous surgery  on the kidney or ureters.  Numerous kidney infections. SYMPTOMS   Feeling sick to your stomach (nauseous).  Throwing up (vomiting).  Blood in the urine (hematuria).  Pain that usually spreads (radiates) to the groin.  Frequency or urgency of urination. DIAGNOSIS   Taking a history and  physical exam.  Blood or urine tests.  CT scan.  Occasionally, an examination of the inside of the urinary bladder (cystoscopy) is performed. TREATMENT   Observation.  Increasing your fluid intake.  Extracorporeal shock wave lithotripsy This is a noninvasive procedure that uses shock waves to break up kidney stones.  Surgery may be needed if you have severe pain or persistent obstruction. There are various surgical procedures. Most of the procedures are performed with the use of small instruments. Only small incisions are needed to accommodate these instruments, so recovery time is minimized. The size, location, and chemical composition are all important variables that will determine the proper choice of action for you. Talk to your health care provider to better understand your situation so that you will minimize the risk of injury to yourself and your kidney.  HOME CARE INSTRUCTIONS   Drink enough water and fluids to keep your urine clear or pale yellow. This will help you to pass the stone or stone fragments.  Strain all urine through the provided strainer. Keep all particulate matter and stones for your health care provider to see. The stone causing the pain may be as small as a grain of salt. It is very important to use the strainer each and every time you pass your urine. The collection of your stone will allow your health care provider to analyze it and verify that a stone has actually passed. The stone analysis will often identify what you can do to reduce the incidence of recurrences.  Only take over-the-counter or prescription medicines for pain, discomfort, or fever as directed by your health care provider.  Make a follow-up appointment with your health care provider as directed.  Get follow-up X-rays if required. The absence of pain does not always mean that the stone has passed. It may have only stopped moving. If the urine remains completely obstructed, it can cause loss of  kidney function or even complete destruction of the kidney. It is your responsibility to make sure X-rays and follow-ups are completed. Ultrasounds of the kidney can show blockages and the status of the kidney. Ultrasounds are not associated with any radiation and can be performed easily in a matter of minutes. SEEK MEDICAL CARE IF:  You experience pain that is progressive and unresponsive to any pain medicine you have been prescribed. SEEK IMMEDIATE MEDICAL CARE IF:   Pain cannot be controlled with the prescribed medicine.  You have a fever or shaking chills.  The severity or intensity of pain increases over 18 hours and is not relieved by pain medicine.  You develop a new onset of abdominal pain.  You feel faint or pass out.  You are unable to urinate. MAKE SURE YOU:   Understand these instructions.  Will watch your condition.  Will get help right away if you are not doing well or get worse. Document Released: 02/26/2005 Document Revised: 10/29/2012 Document Reviewed: 07/30/2012 [facility] Patient Information 2014 [place].

## 2013-04-02 LAB — URINE CULTURE
COLONY COUNT: NO GROWTH
Organism ID, Bacteria: NO GROWTH

## 2013-04-21 ENCOUNTER — Encounter (INDEPENDENT_AMBULATORY_CARE_PROVIDER_SITE_OTHER): Payer: Self-pay

## 2013-04-21 ENCOUNTER — Encounter: Payer: Self-pay | Admitting: Adult Health

## 2013-04-21 ENCOUNTER — Ambulatory Visit (INDEPENDENT_AMBULATORY_CARE_PROVIDER_SITE_OTHER): Payer: Medicaid Other | Admitting: Adult Health

## 2013-04-21 VITALS — BP 120/82 | Ht 62.0 in | Wt 247.0 lb

## 2013-04-21 DIAGNOSIS — R319 Hematuria, unspecified: Secondary | ICD-10-CM | POA: Insufficient documentation

## 2013-04-21 DIAGNOSIS — N390 Urinary tract infection, site not specified: Secondary | ICD-10-CM

## 2013-04-21 HISTORY — DX: Hematuria, unspecified: R31.9

## 2013-04-21 LAB — POCT URINALYSIS DIPSTICK
Blood, UA: 1
GLUCOSE UA: NEGATIVE
KETONES UA: NEGATIVE
LEUKOCYTES UA: NEGATIVE
Nitrite, UA: NEGATIVE
PROTEIN UA: 1

## 2013-04-21 MED ORDER — SULFAMETHOXAZOLE-TMP DS 800-160 MG PO TABS: 1.0000 | ORAL_TABLET | Freq: Two times a day (BID) | ORAL | Status: DC

## 2013-04-21 NOTE — Progress Notes (Signed)
Subjective:     Patient ID: Kathy White, female   DOB: 16-May-1980, 33 y.o.   MRN: [id number]  HPI Kathy White is a 33 year old female in complaining of frequency of urination then was peeing blood, started yesterday, no pain.Has been pushing fluids and it is better.No history of kidney stones.Had negative culture in January, but had 1+ blood on urine.  Review of Systems See HPI Reviewed past medical,surgical, social and family history. Reviewed medications and allergies.     Objective:   Physical Exam BP 120/82  Ht 5\' 2"  (1.575 m)  Wt 247 lb (112.038 kg)  BMI 45.17 kg/m2urine +blood and protein   Skin warm and dry.Pelvic: external genitalia is normal in appearance, vagina: no discharge, cervix:smooth and bulbous+IUD strings seen, uterus: normal size, shape and contour, non tender, no masses felt, adnexa: no masses or tenderness noted. No CVAT. Reviewed pap with pt that was done 02/26/13, negative HPV.  Assessment:     Hematuria    Plan:    UA C&S sent, if negative culture but blood on urine will refer to urologist to be evaluated. Rx septra ds 1 bid x 7 days Push fluids Review handout on hematuria

## 2013-04-21 NOTE — Patient Instructions (Signed)
Hematuria, Adult Hematuria is blood in your urine. It can be caused by a bladder infection, kidney infection, prostate infection, kidney stone, or cancer of your urinary tract. Infections can usually be treated with medicine, and a kidney stone usually will pass through your urine. If neither of these is the cause of your hematuria, further workup to find out the reason may be needed. It is very important that you tell your health care provider about any blood you see in your urine, even if the blood stops without treatment or happens without causing pain. Blood in your urine that happens and then stops and then happens again can be a symptom of a very serious condition. Also, pain is not a symptom in the initial stages of many urinary cancers. HOME CARE INSTRUCTIONS   Drink lots of fluid, 3 4 quarts a day. If you have been diagnosed with an infection, cranberry juice is especially recommended, in addition to large amounts of water.  Avoid caffeine, tea, and carbonated beverages, because they tend to irritate the bladder.  Avoid alcohol because it may irritate the prostate.  Only take over-the-counter or prescription medicines for pain, discomfort, or fever as directed by your health care provider.  If you have been diagnosed with a kidney stone, follow your health care provider's instructions regarding straining your urine to catch the stone.  Empty your bladder often. Avoid holding urine for long periods of time.  After a bowel movement, women should cleanse front to back. Use each tissue only once.  Empty your bladder before and after sexual intercourse if you are a female. SEEK MEDICAL CARE IF: You develop back pain, fever, a feeling of sickness in your stomach (nausea), or vomiting or if your symptoms are not better in 3 days. Return sooner if you are getting worse. SEEK IMMEDIATE MEDICAL CARE IF:   You have a persistent fever, with a temperature of 101.46F (38.8C) or greater.  You  develop severe vomiting and are unable to keep the medicine down.  You develop severe back or abdominal pain despite taking your medicines.  You begin passing a large amount of blood or clots in your urine.  You feel extremely weak or faint, or you pass out. MAKE SURE YOU:   Understand these instructions.  Will watch your condition.  Will get help right away if you are not doing well or get worse. Document Released: 02/26/2005 Document Revised: 12/17/2012 Document Reviewed: 10/27/2012 [facility] Patient Information 2014 [place]. Push fluids Take septra ds 1 bid x 7 days

## 2013-04-22 LAB — URINALYSIS
Glucose, UA: NEGATIVE mg/dL
NITRITE: NEGATIVE
PROTEIN: 30 mg/dL — AB
Specific Gravity, Urine: 1.03 (ref 1.005–1.030)
UROBILINOGEN UA: 1 mg/dL (ref 0.0–1.0)
pH: 5.5 (ref 5.0–8.0)

## 2013-04-22 LAB — URINE CULTURE
COLONY COUNT: NO GROWTH
ORGANISM ID, BACTERIA: NO GROWTH

## 2013-04-27 ENCOUNTER — Telehealth: Payer: Self-pay | Admitting: Adult Health

## 2013-04-27 NOTE — Telephone Encounter (Signed)
Pt much better, wants to hold off on urology referral but if happens again will refer

## 2014-01-09 ENCOUNTER — Encounter (HOSPITAL_COMMUNITY): Payer: Self-pay | Admitting: Emergency Medicine

## 2014-01-09 ENCOUNTER — Emergency Department (HOSPITAL_COMMUNITY)
Admission: EM | Admit: 2014-01-09 | Discharge: 2014-01-09 | Disposition: A | Discharge status: Home / Self Care | Payer: Medicaid Other | Source: Home / Self Care | Attending: Family Medicine | Admitting: Family Medicine

## 2014-01-09 ENCOUNTER — Emergency Department (INDEPENDENT_AMBULATORY_CARE_PROVIDER_SITE_OTHER): Payer: Medicaid Other

## 2014-01-09 DIAGNOSIS — W19XXXA Unspecified fall, initial encounter: Secondary | ICD-10-CM

## 2014-01-09 DIAGNOSIS — S93402A Sprain of unspecified ligament of left ankle, initial encounter: Secondary | ICD-10-CM

## 2014-01-09 DIAGNOSIS — S93401A Sprain of unspecified ligament of right ankle, initial encounter: Secondary | ICD-10-CM

## 2014-01-09 MED ORDER — BACITRACIN 500 UNIT/GM EX OINT
1.0000 "application " | TOPICAL_OINTMENT | Freq: Once | CUTANEOUS | Status: AC
  Administered 2014-01-09: 1 via TOPICAL

## 2014-01-09 MED ORDER — BACITRACIN ZINC 500 UNIT/GM EX OINT
TOPICAL_OINTMENT | CUTANEOUS | Status: AC
  Filled 2014-01-09: qty 0.9

## 2014-01-09 NOTE — ED Provider Notes (Signed)
CSN: [id number]     Arrival date & time 01/09/14  1004 History   First MD Initiated Contact with Patient 01/09/14 1045     Chief Complaint  Patient presents with  . Fall  . Ankle Pain   (Consider location/radiation/quality/duration/timing/severity/associated sxs/prior Treatment) Patient is a 33 y.o. female presenting with fall and ankle pain. The history is provided by the patient.  Fall This is a new problem. The current episode started 3 to 5 hours ago (step broke taking dog out this am and fell with bilat ankle inury). The problem has not changed since onset.Pertinent negatives include no chest pain and no abdominal pain.  Ankle Pain Location:  Ankle Time since incident:  3 hours Injury: yes   Mechanism of injury: fall   Fall:    Fall occurred:  Down stairs   Impact surface:  Concrete   Entrapped after fall: no   Ankle location:  R ankle and L ankle Pain details:    Quality:  Shooting   Radiates to:  Does not radiate Chronicity:  New Dislocation: no   Prior injury to area:  No Associated symptoms: decreased ROM and stiffness   Associated symptoms: no numbness   Risk factors: no concern for non-accidental trauma     Past Medical History  Diagnosis Date  . Worsening headaches   . Cancer   . HPV (human papilloma virus) infection   . Obesity   . Abnormal Pap smear   . UTI (lower urinary tract infection)   . Swelling of both ankles 08/01/2012  . IUD (intrauterine device) in place 02/26/2013  . FH: breast cancer in first degree relative 02/26/2013  . Hypertension   . Hematuria 04/21/2013   Past Surgical History  Procedure Laterality Date  . Cholecystectomy    . Cesarean section    . Cervical biopsy  w/ loop electrode excision     Family History  Problem Relation Age of Onset  . Cancer Mother 3    breast  . Hypertension Mother   . Cancer Sister 76    breast  . Hypertension Sister   . Obesity Sister   . Cancer Cousin 29    breast  . Diabetes Other   .  Thyroid disease Brother   . Diabetes Sister   . Hypertension Sister   . Neuropathy Sister   . Obesity Sister   . Hypertension Sister   . Thyroid disease Sister    History  Substance Use Topics  . Smoking status: Never Smoker   . Smokeless tobacco: Never Used  . Alcohol Use: No   OB History   Grav Para Term Preterm Abortions TAB SAB Ect Mult Living   1 1        1      Review of Systems  Constitutional: Negative.   Cardiovascular: Negative for chest pain.  Gastrointestinal: Negative for abdominal pain.  Musculoskeletal: Positive for gait problem, joint swelling and stiffness.  Skin: Positive for wound.    Allergies  Review of patient's allergies indicates no known allergies.  Home Medications   Prior to Admission medications   Medication Sig Start Date End Date Taking? Authorizing Provider  hydrochlorothiazide (MICROZIDE) 12.5 MG capsule Take 1 capsule (12.5 mg total) by mouth daily. 08/01/12  Yes Estill Dooms, NP  levonorgestrel (MIRENA) 20 MCG/24HR IUD 1 each by Intrauterine route once.   Yes Historical Provider, MD  propranolol ER (INDERAL LA) 120 MG 24 hr capsule Take 1 capsule (120 mg total) by mouth  daily. 08/01/12  Yes Estill Dooms, NP  CRANBERRY PO Take 300 mg by mouth daily.    Historical Provider, MD  sulfamethoxazole-trimethoprim (BACTRIM DS) 800-160 MG per tablet Take 1 tablet by mouth 2 (two) times daily. 04/21/13   Estill Dooms, NP   BP 128/86  Pulse 66  Temp(Src) 98.4 F (36.9 C) (Oral)  Resp 18  SpO2 100% Physical Exam  Nursing note and vitals reviewed. Constitutional: She is oriented to person, place, and time. She appears well-developed and well-nourished.  Musculoskeletal: She exhibits tenderness.       Right ankle: She exhibits decreased range of motion and swelling. She exhibits no deformity and normal pulse. Tenderness. Lateral malleolus tenderness found. No medial malleolus, no head of 5th metatarsal and no proximal fibula  tenderness found. Achilles tendon normal.       Left ankle: She exhibits decreased range of motion and swelling. She exhibits normal pulse. Tenderness. Lateral malleolus tenderness found. No medial malleolus, no head of 5th metatarsal and no proximal fibula tenderness found. Achilles tendon normal.  Neurological: She is alert and oriented to person, place, and time.  Skin: Skin is warm and dry.    ED Course  Procedures (including critical care time) Labs Review Labs Reviewed - No data to display  Imaging Review Dg Ankle Complete Left  01/09/2014   CLINICAL DATA:  Fall  EXAM: LEFT ANKLE COMPLETE - 3+ VIEW  COMPARISON:  None.  FINDINGS: There is no evidence of fracture, dislocation, or joint effusion. There is no evidence of arthropathy or other focal bone abnormality. Soft tissues are unremarkable.  IMPRESSION: Negative.   Electronically Signed   By: Maryclare Bean M.D.   On: 01/09/2014 11:49   Dg Ankle Complete Right  01/09/2014   CLINICAL DATA:  Fall  EXAM: RIGHT ANKLE - COMPLETE 3+ VIEW  COMPARISON:  None.  FINDINGS: No fracture. No dislocation. Minimal spurring at the inferior calcaneus.  IMPRESSION: No acute bony pathology.   Electronically Signed   By: Maryclare Bean M.D.   On: 01/09/2014 11:49   X-rays reviewed and report per radiologist.   MDM   1. Fall   2. Ankle sprain, left, initial encounter   3. Ankle sprain, right, initial encounter        Billy Fischer, MD 01/09/14 1206

## 2014-01-09 NOTE — ED Notes (Signed)
Reports falling down outdoor steps this morning.  C/O bilat ankle tenderness & swelling - R>L.  CMS intact.  Large abrasion to left anterior lateral ankle; small abrasion to left knee.  States only pain is in ankles.  Last tetanus shot 2 months ago.

## 2014-01-09 NOTE — Discharge Instructions (Signed)
Wear ankle support as needed for comfort, activity as tolerated.  Ice and advil as needed, return or see orthopedist if further problems.

## 2014-01-11 ENCOUNTER — Encounter (HOSPITAL_COMMUNITY): Payer: Self-pay | Admitting: Emergency Medicine

## 2014-01-13 DIAGNOSIS — I10 Essential (primary) hypertension: Secondary | ICD-10-CM | POA: Insufficient documentation

## 2014-01-13 HISTORY — DX: Essential (primary) hypertension: I10

## 2014-03-11 ENCOUNTER — Other Ambulatory Visit: Payer: Medicaid Other | Admitting: Adult Health

## 2014-03-18 ENCOUNTER — Ambulatory Visit (INDEPENDENT_AMBULATORY_CARE_PROVIDER_SITE_OTHER): Payer: Medicaid Other | Admitting: Adult Health

## 2014-03-18 ENCOUNTER — Encounter: Payer: Self-pay | Admitting: Adult Health

## 2014-03-18 ENCOUNTER — Other Ambulatory Visit: Payer: Self-pay | Admitting: Adult Health

## 2014-03-18 VITALS — BP 122/80 | HR 78 | Ht 63.0 in | Wt 242.0 lb

## 2014-03-18 DIAGNOSIS — Z1231 Encounter for screening mammogram for malignant neoplasm of breast: Secondary | ICD-10-CM

## 2014-03-18 DIAGNOSIS — Z803 Family history of malignant neoplasm of breast: Secondary | ICD-10-CM

## 2014-03-18 DIAGNOSIS — Z975 Presence of (intrauterine) contraceptive device: Secondary | ICD-10-CM

## 2014-03-18 DIAGNOSIS — Z0000 Encounter for general adult medical examination without abnormal findings: Secondary | ICD-10-CM

## 2014-03-18 DIAGNOSIS — Z01419 Encounter for gynecological examination (general) (routine) without abnormal findings: Secondary | ICD-10-CM

## 2014-03-18 NOTE — Patient Instructions (Signed)
Physical in 1 year Mammogram 1/11 at 12:10 pm and yearly

## 2014-03-18 NOTE — Progress Notes (Signed)
Patient ID: Kathy White, female   DOB: 1980/03/28, 34 y.o.   MRN: [id number] History of Present Illness: Kathy White is a  34 year old Hispanic female, single in for gyn exam.She had a normal pap with negative HPV 02/26/13.Her last mammogram was in 2013.Her partner has been deported but is back.   Current Medications, Allergies, Past Medical History, Past Surgical History, Family History and Social History were reviewed in [employer] record.     Review of Systems: Patient denies any headaches, blurred vision, shortness of breath, chest pain, abdominal pain, problems with bowel movements, urination, or intercourse. No joint pain or mood swings.Happy with IUD.    Physical Exam:BP 122/80 mmHg  Pulse 78  Ht 5' 3"  (1.6 m)  Wt 242 lb (109.77 kg)  BMI 42.88 kg/m2 General:  Well developed, well nourished, no acute distress Skin:  Warm and dry Neck:  Midline trachea, normal thyroid Lungs; Clear to auscultation bilaterally Breast:  No dominant palpable mass, retraction, or nipple discharge, has bilateral regular iregularities Cardiovascular: Regular rate and rhythm Abdomen:  Soft, non tender, no hepatosplenomegaly Pelvic:  External genitalia is normal in appearance, no lesions.  The vagina is normal in appearance.       The cervix is bulbous, +IUD strings at os..  Uterus is felt to be normal size, shape, and contour.  No       adnexal masses or tenderness noted. Extremities:  No swelling or varicosities noted Psych:  No mood changes,alert and cooperative,seems happy Discussed importance of getting yearly mammograms even with negative BRCA testing.  Impression: Well woman gyn exam no pap IUD in place Family history of breast cancer in first degree relative    Plan: Mammogram 1/11 at 12:10 pm at [facility] Physical in 1 year Mammogram yearly Labs with PCP Encouraged to get flu shot

## 2014-03-22 ENCOUNTER — Ambulatory Visit (HOSPITAL_COMMUNITY)
Admission: RE | Admit: 2014-03-22 | Discharge: 2014-03-22 | Disposition: A | Discharge status: Home / Self Care | Payer: Medicaid Other | Source: Ambulatory Visit | Attending: Adult Health | Admitting: Adult Health

## 2014-03-22 DIAGNOSIS — Z1231 Encounter for screening mammogram for malignant neoplasm of breast: Secondary | ICD-10-CM | POA: Diagnosis present

## 2014-05-14 ENCOUNTER — Emergency Department (HOSPITAL_COMMUNITY)
Admission: EM | Admit: 2014-05-14 | Discharge: 2014-05-14 | Disposition: A | Discharge status: Home / Self Care | Payer: Medicaid Other | Attending: Emergency Medicine | Admitting: Emergency Medicine

## 2014-05-14 ENCOUNTER — Encounter (HOSPITAL_COMMUNITY): Payer: Self-pay | Admitting: Emergency Medicine

## 2014-05-14 ENCOUNTER — Emergency Department (HOSPITAL_COMMUNITY): Payer: Medicaid Other

## 2014-05-14 DIAGNOSIS — I10 Essential (primary) hypertension: Secondary | ICD-10-CM | POA: Diagnosis not present

## 2014-05-14 DIAGNOSIS — Z859 Personal history of malignant neoplasm, unspecified: Secondary | ICD-10-CM | POA: Insufficient documentation

## 2014-05-14 DIAGNOSIS — Z79899 Other long term (current) drug therapy: Secondary | ICD-10-CM | POA: Insufficient documentation

## 2014-05-14 DIAGNOSIS — E669 Obesity, unspecified: Secondary | ICD-10-CM | POA: Diagnosis not present

## 2014-05-14 DIAGNOSIS — M62830 Muscle spasm of back: Secondary | ICD-10-CM | POA: Insufficient documentation

## 2014-05-14 DIAGNOSIS — Z8619 Personal history of other infectious and parasitic diseases: Secondary | ICD-10-CM | POA: Diagnosis not present

## 2014-05-14 DIAGNOSIS — Z7951 Long term (current) use of inhaled steroids: Secondary | ICD-10-CM | POA: Insufficient documentation

## 2014-05-14 DIAGNOSIS — Z3202 Encounter for pregnancy test, result negative: Secondary | ICD-10-CM | POA: Insufficient documentation

## 2014-05-14 DIAGNOSIS — M545 Low back pain: Secondary | ICD-10-CM | POA: Diagnosis present

## 2014-05-14 DIAGNOSIS — Z87440 Personal history of urinary (tract) infections: Secondary | ICD-10-CM | POA: Insufficient documentation

## 2014-05-14 LAB — POC URINE PREG, ED: Preg Test, Ur: NEGATIVE

## 2014-05-14 MED ORDER — METAXALONE 800 MG PO TABS: 800.0000 mg | ORAL_TABLET | Freq: Three times a day (TID) | ORAL | Status: DC

## 2014-05-14 NOTE — ED Provider Notes (Signed)
CSN: [id number]     Arrival date & time 05/14/14  2355 History   First MD Initiated Contact with Patient 05/14/14 [phone]     Chief Complaint  Patient presents with  . Back Pain     (Consider location/radiation/quality/duration/timing/severity/associated sxs/prior Treatment) The history is provided by the patient.   Kathy White is a 34 y.o. female presenting with a one week history of right mid to lower back pain associated with numbness radiating from the site to her right hip.  She developed a tender knot on her back today.  She denies injury or falls.  She does have occasional mild low back but typically without radiation or long lasting pain.  She denies dysuria, hematuria, fever or chills, denies abdominal pain.  She denies IVDU, denies h/o.  Despite medical history reported in chart, states strong family hx of breast cancer, she does not have this history.    Past Medical History  Diagnosis Date  . Worsening headaches   . Cancer   . HPV (human papilloma virus) infection   . Obesity   . Abnormal Pap smear   . UTI (lower urinary tract infection)   . Swelling of both ankles 08/01/2012  . IUD (intrauterine device) in place 02/26/2013  . FH: breast cancer in first degree relative 02/26/2013  . Hypertension   . Hematuria 04/21/2013  . Vaginal Pap smear, abnormal    Past Surgical History  Procedure Laterality Date  . Cholecystectomy    . Cesarean section    . Cervical biopsy  w/ loop electrode excision     Family History  Problem Relation Age of Onset  . Cancer Mother 1    breast  . Hypertension Mother   . Cancer Sister 59    breast  . Hypertension Sister   . Diabetes Sister   . Cancer Cousin 29    breast  . Thyroid disease Brother   . Diabetes Sister   . Hypertension Sister   . Neuropathy Sister   . Obesity Sister   . Hypertension Sister   . Thyroid disease Sister   . Cancer Sister     endometrial  . Diabetes Paternal Grandfather   . Cancer Maternal  Grandmother   . Cancer Maternal Grandfather   . Diabetes Father   . Hypertension Father   . Other Father     open heart surgery   History  Substance Use Topics  . Smoking status: Never Smoker   . Smokeless tobacco: Never Used  . Alcohol Use: Yes     Comment: once in a blue moon.   OB History    Gravida Para Term Preterm AB TAB SAB Ectopic Multiple Living   1 1        1      Review of Systems  Constitutional: Negative for fever.  Respiratory: Negative for shortness of breath.   Cardiovascular: Negative for chest pain and leg swelling.  Gastrointestinal: Negative for abdominal pain, constipation and abdominal distention.  Genitourinary: Negative for dysuria, urgency, frequency, flank pain and difficulty urinating.  Musculoskeletal: Positive for back pain. Negative for joint swelling and gait problem.  Skin: Negative for rash.  Neurological: Negative for weakness and numbness.      Allergies  Review of patient's allergies indicates no known allergies.  Home Medications   Prior to Admission medications   Medication Sig Start Date End Date Taking? Authorizing Provider  CRANBERRY PO Take 300 mg by mouth daily.   Yes Historical Provider, MD  fluticasone (FLONASE) 50 MCG/ACT nasal spray Place 1 spray into the nose daily as needed for allergies.  07/20/13  Yes Historical Provider, MD  hydrochlorothiazide (MICROZIDE) 12.5 MG capsule Take 1 capsule (12.5 mg total) by mouth daily. 08/01/12  Yes Estill Dooms, NP  ibuprofen (ADVIL,MOTRIN) 200 MG tablet Take 600 mg by mouth daily as needed for moderate pain.   Yes Historical Provider, MD  levonorgestrel (MIRENA) 20 MCG/24HR IUD 1 each by Intrauterine route once.   Yes Historical Provider, MD  propranolol ER (INDERAL LA) 120 MG 24 hr capsule Take 1 capsule (120 mg total) by mouth daily. 08/01/12  Yes Estill Dooms, NP  metaxalone (SKELAXIN) 800 MG tablet Take 1 tablet (800 mg total) by mouth 3 (three) times daily. 05/14/14   Evalee Jefferson, PA-C   BP 144/92 mmHg  Pulse 69  Temp(Src) 98.1 F (36.7 C) (Oral)  Resp 20  Ht 5\' 3"  (1.6 m)  Wt 244 lb (110.678 kg)  BMI 43.23 kg/m2  SpO2 100% Physical Exam  Constitutional: She appears well-developed and well-nourished.  HENT:  Head: Normocephalic.  Eyes: Conjunctivae are normal.  Neck: Normal range of motion. Neck supple.  Cardiovascular: Normal rate and intact distal pulses.   Pedal pulses normal.  Pulmonary/Chest: Effort normal.  Abdominal: Soft. Bowel sounds are normal. She exhibits no distension and no mass. There is no guarding.  Musculoskeletal: Normal range of motion. She exhibits tenderness. She exhibits no edema.       Lumbar back: She exhibits tenderness. She exhibits no swelling, no edema and no spasm.       Back:  Right parlumbar ttp.  Muscle spasm right paralumbar.  Midline with lesser tenderness. No rash, no erythema.  Neurological: She is alert. She has normal strength. She displays no atrophy and no tremor. No sensory deficit. Gait normal.  Reflex Scores:      Patellar reflexes are 2+ on the right side and 2+ on the left side.      Achilles reflexes are 2+ on the right side and 2+ on the left side. No strength deficit noted in hip and knee flexor and extensor muscle groups.  Ankle flexion and extension intact.  Skin: Skin is warm and dry.  Psychiatric: She has a normal mood and affect.  Nursing note and vitals reviewed.   ED Course  Procedures (including critical care time) Labs Review Labs Reviewed  POC URINE PREG, ED    Imaging Review Dg Lumbar Spine Complete  05/14/2014   CLINICAL DATA:  Acute right-sided lower back pain with radiation into right hip without known injury. Initial encounter.  EXAM: LUMBAR SPINE - COMPLETE 4+ VIEW  COMPARISON:  None.  FINDINGS: There is no evidence of lumbar spine fracture. Alignment is normal. Intervertebral disc spaces are maintained. Posterior facet joints appear normal.  IMPRESSION: Normal lumbar spine.    Electronically Signed   By: Marijo Conception, M.D.   On: 05/14/2014 10:45     EKG Interpretation None      MDM   Final diagnoses:  Muscle spasm of back    Patients labs and/or radiological studies were reviewed and considered during the medical decision making and disposition process.  Results were also discussed with patient. Pt prescribed skelaxin.  Advised ibuprofen 600 mg q 8 hours (pt has).  Heat tx.    Call from pharmacy after pt left dept.  Her insurance does not cover skelaxin. Switched to robaxin 500mg  qid #20  Plan f/u with pcp for  a recheck in 1 week if sx persist.  No neuro deficit on exam or by history to suggest emergent or surgical presentation.  Also discussed worsened sx that should prompt immediate re-evaluation including distal weakness, bowel/bladder retention/incontinence.          Evalee Jefferson, PA-C 05/14/14 2115  Nat Christen, MD 05/22/14 [phone]

## 2014-05-14 NOTE — ED Notes (Signed)
Patient complaining of back pain x 1 week. Denies injury. States "I had a knot pop up on my back today." Patient has swelling noted to right middle back. Patient also complaining of numbness in right hip and back.

## 2014-05-14 NOTE — Discharge Instructions (Signed)
Muscle Cramps and Spasms Muscle cramps and spasms occur when a muscle or muscles tighten and you have no control over this tightening (involuntary muscle contraction). They are a common problem and can develop in any muscle. The most common place is in the calf muscles of the leg. Both muscle cramps and muscle spasms are involuntary muscle contractions, but they also have differences:   Muscle cramps are sporadic and painful. They may last a few seconds to a quarter of an hour. Muscle cramps are often more forceful and last longer than muscle spasms.  Muscle spasms may or may not be painful. They may also last just a few seconds or much longer. CAUSES  It is uncommon for cramps or spasms to be due to a serious underlying problem. In many cases, the cause of cramps or spasms is unknown. Some common causes are:   Overexertion.   Overuse from repetitive motions (doing the same thing over and over).   Remaining in a certain position for a long period of time.   Improper preparation, form, or technique while performing a sport or activity.   Dehydration.   Injury.   Side effects of some medicines.   Abnormally low levels of the salts and ions in your blood (electrolytes), especially potassium and calcium. This could happen if you are taking water pills (diuretics) or you are pregnant.  Some underlying medical problems can make it more likely to develop cramps or spasms. These include, but are not limited to:   Diabetes.   Parkinson disease.   Hormone disorders, such as thyroid problems.   Alcohol abuse.   Diseases specific to muscles, joints, and bones.   Blood vessel disease where not enough blood is getting to the muscles.  HOME CARE INSTRUCTIONS   Stay well hydrated. Drink enough water and fluids to keep your urine clear or pale yellow.  It may be helpful to massage, stretch, and relax the affected muscle.  For tight or tense muscles, use a warm towel, heating  pad, or hot shower water directed to the affected area.  If you are sore or have pain after a cramp or spasm, applying ice to the affected area may relieve discomfort.  Put ice in a plastic bag.  Place a towel between your skin and the bag.  Leave the ice on for 15-20 minutes, 03-04 times a day.  Medicines used to treat a known cause of cramps or spasms may help reduce their frequency or severity. Only take over-the-counter or prescription medicines as directed by your caregiver. SEEK MEDICAL CARE IF:  Your cramps or spasms get more severe, more frequent, or do not improve over time.  MAKE SURE YOU:   Understand these instructions.  Will watch your condition.  Will get help right away if you are not doing well or get worse. Document Released: 08/18/2001 Document Revised: 06/23/2012 Document Reviewed: 02/13/2012 [facility] Patient Information 2015 [place], [place]. This information is not intended to replace advice given to you by your health care provider. Make sure you discuss any questions you have with your health care provider.   As discussed he may benefit from heat therapy, use a heating pad or standing in hot shower for 15 minutes 3 times daily.  I recommend taking your ibuprofen 3 tablets (advil or motrin) which equals 600 mg 3 times daily along with the muscle relaxer prescribed.  Follow-up with your doctor for recheck a few symptoms are not improving over the next week.

## 2014-06-29 ENCOUNTER — Telehealth: Payer: Self-pay | Admitting: Adult Health

## 2014-06-29 NOTE — Telephone Encounter (Signed)
Had longer bleeding this month, has IUD, can make appt if continues

## 2014-07-12 ENCOUNTER — Encounter (HOSPITAL_COMMUNITY): Payer: Self-pay | Admitting: Emergency Medicine

## 2014-07-12 ENCOUNTER — Emergency Department (HOSPITAL_COMMUNITY)
Admission: EM | Admit: 2014-07-12 | Discharge: 2014-07-12 | Disposition: A | Discharge status: Home / Self Care | Payer: Medicaid Other | Attending: Emergency Medicine | Admitting: Emergency Medicine

## 2014-07-12 ENCOUNTER — Emergency Department (HOSPITAL_COMMUNITY): Payer: Medicaid Other

## 2014-07-12 DIAGNOSIS — Z8619 Personal history of other infectious and parasitic diseases: Secondary | ICD-10-CM | POA: Diagnosis not present

## 2014-07-12 DIAGNOSIS — M25562 Pain in left knee: Secondary | ICD-10-CM

## 2014-07-12 DIAGNOSIS — Y9289 Other specified places as the place of occurrence of the external cause: Secondary | ICD-10-CM | POA: Diagnosis not present

## 2014-07-12 DIAGNOSIS — Y9389 Activity, other specified: Secondary | ICD-10-CM | POA: Insufficient documentation

## 2014-07-12 DIAGNOSIS — E669 Obesity, unspecified: Secondary | ICD-10-CM | POA: Diagnosis not present

## 2014-07-12 DIAGNOSIS — Z7951 Long term (current) use of inhaled steroids: Secondary | ICD-10-CM | POA: Insufficient documentation

## 2014-07-12 DIAGNOSIS — Y998 Other external cause status: Secondary | ICD-10-CM | POA: Insufficient documentation

## 2014-07-12 DIAGNOSIS — X58XXXA Exposure to other specified factors, initial encounter: Secondary | ICD-10-CM | POA: Insufficient documentation

## 2014-07-12 DIAGNOSIS — Z8739 Personal history of other diseases of the musculoskeletal system and connective tissue: Secondary | ICD-10-CM | POA: Diagnosis not present

## 2014-07-12 DIAGNOSIS — Z791 Long term (current) use of non-steroidal anti-inflammatories (NSAID): Secondary | ICD-10-CM | POA: Insufficient documentation

## 2014-07-12 DIAGNOSIS — S8992XA Unspecified injury of left lower leg, initial encounter: Secondary | ICD-10-CM | POA: Diagnosis present

## 2014-07-12 DIAGNOSIS — I10 Essential (primary) hypertension: Secondary | ICD-10-CM | POA: Diagnosis not present

## 2014-07-12 DIAGNOSIS — Z87440 Personal history of urinary (tract) infections: Secondary | ICD-10-CM | POA: Diagnosis not present

## 2014-07-12 DIAGNOSIS — Z79899 Other long term (current) drug therapy: Secondary | ICD-10-CM | POA: Diagnosis not present

## 2014-07-12 MED ORDER — NAPROXEN 500 MG PO TABS: 500.0000 mg | ORAL_TABLET | Freq: Two times a day (BID) | ORAL | Status: DC

## 2014-07-12 NOTE — ED Notes (Signed)
Pt reports twisted left knee this am. Pt reports pain ever since.pt reports increased pain with ambulation.
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
## 2014-07-12 NOTE — Discharge Instructions (Signed)
X-ray shows no fracture. Ice, elevate, knee mobilizer, pain medication. Follow-up with orthopedic doctor if no improvement in one week. Phone number given.

## 2014-10-11 ENCOUNTER — Encounter (HOSPITAL_COMMUNITY): Payer: Self-pay

## 2014-10-11 ENCOUNTER — Emergency Department (HOSPITAL_COMMUNITY)
Admission: EM | Admit: 2014-10-11 | Discharge: 2014-10-11 | Disposition: A | Discharge status: Home / Self Care | Payer: Medicaid Other | Attending: Emergency Medicine | Admitting: Emergency Medicine

## 2014-10-11 DIAGNOSIS — W260XXA Contact with knife, initial encounter: Secondary | ICD-10-CM | POA: Diagnosis not present

## 2014-10-11 DIAGNOSIS — I10 Essential (primary) hypertension: Secondary | ICD-10-CM | POA: Diagnosis not present

## 2014-10-11 DIAGNOSIS — Z87440 Personal history of urinary (tract) infections: Secondary | ICD-10-CM | POA: Insufficient documentation

## 2014-10-11 DIAGNOSIS — Y9389 Activity, other specified: Secondary | ICD-10-CM | POA: Diagnosis not present

## 2014-10-11 DIAGNOSIS — S56129A Laceration of flexor muscle, fascia and tendon of unspecified finger at forearm level, initial encounter: Secondary | ICD-10-CM

## 2014-10-11 DIAGNOSIS — Z79899 Other long term (current) drug therapy: Secondary | ICD-10-CM | POA: Insufficient documentation

## 2014-10-11 DIAGNOSIS — Z8619 Personal history of other infectious and parasitic diseases: Secondary | ICD-10-CM | POA: Insufficient documentation

## 2014-10-11 DIAGNOSIS — Y9289 Other specified places as the place of occurrence of the external cause: Secondary | ICD-10-CM | POA: Insufficient documentation

## 2014-10-11 DIAGNOSIS — E669 Obesity, unspecified: Secondary | ICD-10-CM | POA: Insufficient documentation

## 2014-10-11 DIAGNOSIS — S61214A Laceration without foreign body of right ring finger without damage to nail, initial encounter: Secondary | ICD-10-CM | POA: Insufficient documentation

## 2014-10-11 DIAGNOSIS — Y998 Other external cause status: Secondary | ICD-10-CM | POA: Diagnosis not present

## 2014-10-11 DIAGNOSIS — S61216A Laceration without foreign body of right little finger without damage to nail, initial encounter: Secondary | ICD-10-CM | POA: Diagnosis not present

## 2014-10-11 DIAGNOSIS — S61209A Unspecified open wound of unspecified finger without damage to nail, initial encounter: Secondary | ICD-10-CM

## 2014-10-11 DIAGNOSIS — S61411A Laceration without foreign body of right hand, initial encounter: Secondary | ICD-10-CM | POA: Diagnosis present

## 2014-10-11 MED ORDER — CEPHALEXIN 500 MG PO CAPS
500.0000 mg | ORAL_CAPSULE | Freq: Once | ORAL | Status: AC
  Administered 2014-10-11: 500 mg via ORAL
  Filled 2014-10-11: qty 1

## 2014-10-11 MED ORDER — HYDROGEN PEROXIDE 3 % EX SOLN
CUTANEOUS | Status: AC
  Filled 2014-10-11: qty 473

## 2014-10-11 MED ORDER — CEPHALEXIN 500 MG PO CAPS: 500.0000 mg | ORAL_CAPSULE | Freq: Three times a day (TID) | ORAL | Status: DC

## 2014-10-11 MED ORDER — LIDOCAINE HCL (PF) 1 % IJ SOLN
INTRAMUSCULAR | Status: AC
  Filled 2014-10-11: qty 5

## 2014-10-11 MED ORDER — LIDOCAINE HCL (PF) 1 % IJ SOLN
5.0000 mL | Freq: Once | INTRAMUSCULAR | Status: AC
  Administered 2014-10-11: 5 mL
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
## 2014-10-11 NOTE — Discharge Instructions (Signed)
Call Dr. Lequita Asal office tomorrow to schedule a follow up appointment.

## 2014-10-11 NOTE — ED Notes (Signed)
Discharge instructions given - Instructed on use of OTC pain medication and importance of contacting hand surgeon asap - Pt verbalized understanding .
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
## 2014-10-11 NOTE — ED Notes (Signed)
EDP at the bedside, placed sterile saline gauze over hand per MD

## 2014-10-11 NOTE — ED Notes (Signed)
NP Neese in room with pt at this time suturing .

## 2014-10-11 NOTE — ED Notes (Signed)
Kathy White at bedside. 

## 2014-10-12 ENCOUNTER — Ambulatory Visit (HOSPITAL_COMMUNITY)
Admission: RE | Admit: 2014-10-12 | Discharge: 2014-10-12 | Disposition: A | Discharge status: Home / Self Care | Payer: Medicaid Other | Source: Ambulatory Visit | Attending: Orthopedic Surgery | Admitting: Orthopedic Surgery

## 2014-10-12 ENCOUNTER — Ambulatory Visit (HOSPITAL_COMMUNITY): Payer: Medicaid Other | Admitting: Anesthesiology

## 2014-10-12 ENCOUNTER — Encounter (HOSPITAL_COMMUNITY)
Admission: RE | Disposition: A | Discharge status: Home / Self Care | Payer: Self-pay | Source: Ambulatory Visit | Attending: Orthopedic Surgery

## 2014-10-12 ENCOUNTER — Encounter (HOSPITAL_COMMUNITY): Payer: Self-pay | Admitting: *Deleted

## 2014-10-12 DIAGNOSIS — Y92099 Unspecified place in other non-institutional residence as the place of occurrence of the external cause: Secondary | ICD-10-CM | POA: Insufficient documentation

## 2014-10-12 DIAGNOSIS — S64494A Injury of digital nerve of right ring finger, initial encounter: Secondary | ICD-10-CM | POA: Insufficient documentation

## 2014-10-12 DIAGNOSIS — S61214A Laceration without foreign body of right ring finger without damage to nail, initial encounter: Secondary | ICD-10-CM | POA: Insufficient documentation

## 2014-10-12 DIAGNOSIS — S66126A Laceration of flexor muscle, fascia and tendon of right little finger at wrist and hand level, initial encounter: Secondary | ICD-10-CM | POA: Diagnosis not present

## 2014-10-12 DIAGNOSIS — S66124A Laceration of flexor muscle, fascia and tendon of right ring finger at wrist and hand level, initial encounter: Secondary | ICD-10-CM | POA: Diagnosis present

## 2014-10-12 DIAGNOSIS — W260XXA Contact with knife, initial encounter: Secondary | ICD-10-CM | POA: Insufficient documentation

## 2014-10-12 DIAGNOSIS — I10 Essential (primary) hypertension: Secondary | ICD-10-CM | POA: Insufficient documentation

## 2014-10-12 HISTORY — PX: OTHER SURGICAL HISTORY: SHX169

## 2014-10-12 HISTORY — PX: NERVE, TENDON AND ARTERY REPAIR: SHX5695

## 2014-10-12 LAB — CBC
HCT: 39.1 % (ref 36.0–46.0)
HEMOGLOBIN: 12.7 g/dL (ref 12.0–15.0)
MCH: 27.9 pg (ref 26.0–34.0)
MCHC: 32.5 g/dL (ref 30.0–36.0)
MCV: 85.9 fL (ref 78.0–100.0)
PLATELETS: 334 10*3/uL (ref 150–400)
RBC: 4.55 MIL/uL (ref 3.87–5.11)
RDW: 13.7 % (ref 11.5–15.5)
WBC: 11.5 10*3/uL — ABNORMAL HIGH (ref 4.0–10.5)

## 2014-10-12 LAB — HCG, SERUM, QUALITATIVE: Preg, Serum: NEGATIVE

## 2014-10-12 SURGERY — NERVE, TENDON AND ARTERY REPAIR
Anesthesia: General | Site: Hand | Laterality: Right

## 2014-10-12 MED ORDER — BUPIVACAINE HCL (PF) 0.25 % IJ SOLN
INTRAMUSCULAR | Status: AC
  Filled 2014-10-12: qty 20

## 2014-10-12 MED ORDER — HYDROMORPHONE HCL 1 MG/ML IJ SOLN
INTRAMUSCULAR | Status: DC
Start: 2014-10-12 — End: 2014-10-12
  Filled 2014-10-12: qty 1

## 2014-10-12 MED ORDER — KETOROLAC TROMETHAMINE 30 MG/ML IJ SOLN
INTRAMUSCULAR | Status: AC
  Filled 2014-10-12: qty 1

## 2014-10-12 MED ORDER — PROPOFOL 10 MG/ML IV BOLUS
INTRAVENOUS | Status: DC | PRN
  Administered 2014-10-12: 200 mg via INTRAVENOUS

## 2014-10-12 MED ORDER — OXYCODONE-ACETAMINOPHEN 5-325 MG PO TABS: 1.0000 | ORAL_TABLET | ORAL | Status: DC | PRN

## 2014-10-12 MED ORDER — FENTANYL CITRATE (PF) 100 MCG/2ML IJ SOLN
INTRAMUSCULAR | Status: DC | PRN
  Administered 2014-10-12: 50 ug via INTRAVENOUS
  Administered 2014-10-12: 100 ug via INTRAVENOUS
  Administered 2014-10-12: 50 ug via INTRAVENOUS

## 2014-10-12 MED ORDER — EPHEDRINE SULFATE 50 MG/ML IJ SOLN
INTRAMUSCULAR | Status: AC
  Filled 2014-10-12: qty 1

## 2014-10-12 MED ORDER — LIDOCAINE HCL (CARDIAC) 20 MG/ML IV SOLN
INTRAVENOUS | Status: DC | PRN
  Administered 2014-10-12: 100 mg via INTRAVENOUS

## 2014-10-12 MED ORDER — OXYCODONE HCL 5 MG PO TABS: 5.0000 mg | ORAL_TABLET | Freq: Once | ORAL | Status: DC | PRN

## 2014-10-12 MED ORDER — ARTIFICIAL TEARS OP OINT
TOPICAL_OINTMENT | OPHTHALMIC | Status: AC
  Filled 2014-10-12: qty 3.5

## 2014-10-12 MED ORDER — ONDANSETRON HCL 4 MG/2ML IJ SOLN
INTRAMUSCULAR | Status: AC
  Filled 2014-10-12: qty 2

## 2014-10-12 MED ORDER — MIDAZOLAM HCL 2 MG/2ML IJ SOLN
INTRAMUSCULAR | Status: AC
  Filled 2014-10-12: qty 4

## 2014-10-12 MED ORDER — ONDANSETRON HCL 4 MG/2ML IJ SOLN
INTRAMUSCULAR | Status: DC | PRN
  Administered 2014-10-12: 4 mg via INTRAVENOUS

## 2014-10-12 MED ORDER — BUPIVACAINE HCL (PF) 0.25 % IJ SOLN
INTRAMUSCULAR | Status: DC | PRN
  Administered 2014-10-12: 8 mL

## 2014-10-12 MED ORDER — LACTATED RINGERS IV SOLN
INTRAVENOUS | Status: DC
  Administered 2014-10-12 (×2): via INTRAVENOUS

## 2014-10-12 MED ORDER — SODIUM CHLORIDE 0.9 % IJ SOLN
INTRAMUSCULAR | Status: AC
  Filled 2014-10-12: qty 10

## 2014-10-12 MED ORDER — KETOROLAC TROMETHAMINE 30 MG/ML IJ SOLN
30.0000 mg | Freq: Once | INTRAMUSCULAR | Status: AC | PRN
  Administered 2014-10-12: 30 mg via INTRAVENOUS

## 2014-10-12 MED ORDER — CHLORHEXIDINE GLUCONATE 4 % EX LIQD: 60.0000 mL | Freq: Once | CUTANEOUS | Status: DC

## 2014-10-12 MED ORDER — 0.9 % SODIUM CHLORIDE (POUR BTL) OPTIME
TOPICAL | Status: DC | PRN
  Administered 2014-10-12: 1000 mL

## 2014-10-12 MED ORDER — HYDROMORPHONE HCL 1 MG/ML IJ SOLN
0.2500 mg | INTRAMUSCULAR | Status: DC | PRN
  Administered 2014-10-12: 0.5 mg via INTRAVENOUS

## 2014-10-12 MED ORDER — CEFAZOLIN SODIUM-DEXTROSE 2-3 GM-% IV SOLR
INTRAVENOUS | Status: AC
  Filled 2014-10-12: qty 50

## 2014-10-12 MED ORDER — LIDOCAINE HCL (CARDIAC) 20 MG/ML IV SOLN
INTRAVENOUS | Status: AC
  Filled 2014-10-12: qty 5

## 2014-10-12 MED ORDER — SUCCINYLCHOLINE CHLORIDE 20 MG/ML IJ SOLN
INTRAMUSCULAR | Status: AC
  Filled 2014-10-12: qty 1

## 2014-10-12 MED ORDER — OXYCODONE HCL 5 MG/5ML PO SOLN: 5.0000 mg | Freq: Once | ORAL | Status: DC | PRN

## 2014-10-12 MED ORDER — FENTANYL CITRATE (PF) 250 MCG/5ML IJ SOLN
INTRAMUSCULAR | Status: AC
  Filled 2014-10-12: qty 5

## 2014-10-12 MED ORDER — DOCUSATE SODIUM 100 MG PO CAPS: 100.0000 mg | ORAL_CAPSULE | Freq: Two times a day (BID) | ORAL | Status: DC

## 2014-10-12 MED ORDER — MIDAZOLAM HCL 5 MG/5ML IJ SOLN
INTRAMUSCULAR | Status: DC | PRN
  Administered 2014-10-12: 2 mg via INTRAVENOUS

## 2014-10-12 MED ORDER — PROMETHAZINE HCL 25 MG/ML IJ SOLN
6.2500 mg | INTRAMUSCULAR | Status: DC | PRN
Start: 2014-10-12 — End: 2014-10-12

## 2014-10-12 MED ORDER — CEFAZOLIN SODIUM-DEXTROSE 2-3 GM-% IV SOLR
2.0000 g | INTRAVENOUS | Status: AC
  Administered 2014-10-12: 2 g via INTRAVENOUS

## 2014-10-12 MED ORDER — PROPOFOL 10 MG/ML IV BOLUS
INTRAVENOUS | Status: AC
  Filled 2014-10-12: qty 20

## 2014-10-12 MED ORDER — PROMETHAZINE HCL 25 MG/ML IJ SOLN
INTRAMUSCULAR | Status: AC
  Filled 2014-10-12: qty 1

## 2014-10-12 MED ORDER — GLYCOPYRROLATE 0.2 MG/ML IJ SOLN
INTRAMUSCULAR | Status: DC | PRN
  Administered 2014-10-12: 0.2 mg via INTRAVENOUS

## 2014-10-12 SURGICAL SUPPLY — 63 items
ALLEVYN WOUND CARE (OTIC EAR SUPPLIES) ×4 IMPLANT
APPLIER CLIP 9.375 SM OPEN (CLIP)
BAG DECANTER FOR FLEXI CONT (MISCELLANEOUS) ×2 IMPLANT
BANDAGE ELASTIC 3 VELCRO ST LF (GAUZE/BANDAGES/DRESSINGS) ×2 IMPLANT
BANDAGE ELASTIC 4 VELCRO ST LF (GAUZE/BANDAGES/DRESSINGS) IMPLANT
BNDG ELASTIC 2 VLCR STRL LF (GAUZE/BANDAGES/DRESSINGS) ×2 IMPLANT
BNDG ESMARK 4X9 LF (GAUZE/BANDAGES/DRESSINGS) ×2 IMPLANT
BNDG GAUZE ELAST 4 BULKY (GAUZE/BANDAGES/DRESSINGS) ×2 IMPLANT
CLIP APPLIE 9.375 SM OPEN (CLIP) IMPLANT
CORDS BIPOLAR (ELECTRODE) ×2 IMPLANT
COVER SURGICAL LIGHT HANDLE (MISCELLANEOUS) ×2 IMPLANT
CUFF TOURNIQUET SINGLE 18IN (TOURNIQUET CUFF) ×2 IMPLANT
CUFF TOURNIQUET SINGLE 24IN (TOURNIQUET CUFF) IMPLANT
DRAPE OEC MINIVIEW 54X84 (DRAPES) IMPLANT
DRAPE SURG 17X23 STRL (DRAPES) ×2 IMPLANT
DRSG ADAPTIC 3X8 NADH LF (GAUZE/BANDAGES/DRESSINGS) IMPLANT
DRSG EMULSION OIL 3X3 NADH (GAUZE/BANDAGES/DRESSINGS) ×2 IMPLANT
GAUZE SPONGE 4X4 12PLY STRL (GAUZE/BANDAGES/DRESSINGS) IMPLANT
GAUZE XEROFORM 1X8 LF (GAUZE/BANDAGES/DRESSINGS) ×2 IMPLANT
GAUZE XEROFORM 5X9 LF (GAUZE/BANDAGES/DRESSINGS) ×2 IMPLANT
GEL ULTRASOUND 20GR AQUASONIC (MISCELLANEOUS) IMPLANT
GLOVE BIOGEL PI IND STRL 7.5 (GLOVE) ×2 IMPLANT
GLOVE BIOGEL PI IND STRL 8.5 (GLOVE) ×1 IMPLANT
GLOVE BIOGEL PI INDICATOR 7.5 (GLOVE) ×2
GLOVE BIOGEL PI INDICATOR 8.5 (GLOVE) ×1
GLOVE SURG ORTHO 8.0 STRL STRW (GLOVE) ×2 IMPLANT
GOWN STRL REUS W/ TWL LRG LVL3 (GOWN DISPOSABLE) ×2 IMPLANT
GOWN STRL REUS W/ TWL XL LVL3 (GOWN DISPOSABLE) ×1 IMPLANT
GOWN STRL REUS W/TWL LRG LVL3 (GOWN DISPOSABLE) ×2
GOWN STRL REUS W/TWL XL LVL3 (GOWN DISPOSABLE) ×1
KIT BASIN OR (CUSTOM PROCEDURE TRAY) ×2 IMPLANT
KIT ROOM TURNOVER OR (KITS) ×2 IMPLANT
LOOP VESSEL MAXI BLUE (MISCELLANEOUS) IMPLANT
MANIFOLD NEPTUNE II (INSTRUMENTS) ×2 IMPLANT
NEEDLE 18GX1X1/2 (RX/OR ONLY) (NEEDLE) ×2 IMPLANT
NEEDLE HYPO 25GX1X1/2 BEV (NEEDLE) IMPLANT
NS IRRIG 1000ML POUR BTL (IV SOLUTION) ×2 IMPLANT
PACK ORTHO EXTREMITY (CUSTOM PROCEDURE TRAY) ×2 IMPLANT
PAD ARMBOARD 7.5X6 YLW CONV (MISCELLANEOUS) ×4 IMPLANT
PAD CAST 3X4 CTTN HI CHSV (CAST SUPPLIES) ×1 IMPLANT
PAD CAST 4YDX4 CTTN HI CHSV (CAST SUPPLIES) IMPLANT
PADDING CAST COTTON 3X4 STRL (CAST SUPPLIES) ×1
PADDING CAST COTTON 4X4 STRL (CAST SUPPLIES)
PADDING CAST SYNTHETIC 2 (CAST SUPPLIES) ×1
PADDING CAST SYNTHETIC 2X4 NS (CAST SUPPLIES) ×1 IMPLANT
SOAP 2 % CHG 4 OZ (WOUND CARE) ×2 IMPLANT
SPEAR EYE SURG WECK-CEL (MISCELLANEOUS) IMPLANT
SPLINT FIBERGLASS 3X12 (CAST SUPPLIES) ×2 IMPLANT
SPONGE GAUZE 4X4 12PLY STER LF (GAUZE/BANDAGES/DRESSINGS) ×2 IMPLANT
SUT FIBERWIRE 4-0 18 DIAM BLUE (SUTURE)
SUT FIBERWIRE 4-0 18 TAPR NDL (SUTURE) ×6
SUT MERSILENE 4 0 P 3 (SUTURE) IMPLANT
SUT PROLENE 4 0 PS 2 18 (SUTURE) IMPLANT
SUT PROLENE 6 0 BV (SUTURE) ×2 IMPLANT
SUT VICRYL RAPIDE 4/0 PS 2 (SUTURE) ×8 IMPLANT
SUTURE FIBERWR 4-0 18 DIA BLUE (SUTURE) IMPLANT
SUTURE FIBERWR 4-0 18 TAPR NDL (SUTURE) ×3 IMPLANT
SYR CONTROL 10ML LL (SYRINGE) IMPLANT
TOWEL OR 17X24 6PK STRL BLUE (TOWEL DISPOSABLE) ×2 IMPLANT
TOWEL OR 17X26 10 PK STRL BLUE (TOWEL DISPOSABLE) ×2 IMPLANT
TUBE CONNECTING 12X1/4 (SUCTIONS) IMPLANT
UNDERPAD 30X30 INCONTINENT (UNDERPADS AND DIAPERS) ×2 IMPLANT
WATER STERILE IRR 1000ML POUR (IV SOLUTION) ×2 IMPLANT

## 2014-10-12 NOTE — Transfer of Care (Addendum)
Immediate Anesthesia Transfer of Care Note  Patient: Kathy White  Procedure(s) Performed: Procedure(s): RIGHT RING FINGER AND RIGHT SMALL FINGER WOUND EXPLORATION WITH TENDON REPAIRS (Right)  Patient Location: PACU  Anesthesia Type:General  Level of Consciousness: awake, alert , oriented and patient cooperative  Airway & Oxygen Therapy: Patient Spontanous Breathing and Patient connected to nasal cannula oxygen  Post-op Assessment: Report given to RN  Post vital signs: Reviewed and stable  Last Vitals:  Filed Vitals:   10/12/14 1454  BP: 146/74  Pulse: 70  Temp: 36.8 C  Resp: 18    Complications: No apparent anesthesia complications

## 2014-10-12 NOTE — Brief Op Note (Signed)
10/12/2014  4:30 PM  PATIENT:  Kathy White  34 y.o. female  PRE-OPERATIVE DIAGNOSIS:  right hand injury  POST-OPERATIVE DIAGNOSIS:  * No post-op diagnosis entered *  PROCEDURE:  Procedure(s): RIGHT RING FINGER AND RIGHT SMALL FINGER WOUND EXPLORATION REPAIR AS INDICATED (Right)  SURGEON:  Surgeon(s) and Role:    * Iran Planas, MD - Primary  PHYSICIAN ASSISTANT:   ASSISTANTS: none   ANESTHESIA:   general  EBL:     BLOOD ADMINISTERED:none  DRAINS: none   LOCAL MEDICATIONS USED:  MARCAINE     SPECIMEN:  No Specimen  DISPOSITION OF SPECIMEN:  N/A  COUNTS:  YES  TOURNIQUET:    DICTATION: .Other Dictation: Dictation Number [id number]  PLAN OF CARE: Discharge to home after PACU  PATIENT DISPOSITION:  PACU - hemodynamically stable.   Delay start of Pharmacological VTE agent (>24hrs) due to surgical blood loss or risk of bleeding: not applicable

## 2014-10-12 NOTE — Anesthesia Procedure Notes (Signed)
Procedure Name: LMA Insertion Date/Time: 10/12/2014 5:01 PM Performed by: Sampson Si E Pre-anesthesia Checklist: Patient identified, Emergency Drugs available, Suction available, Patient being monitored and Timeout performed Patient Re-evaluated:Patient Re-evaluated prior to inductionOxygen Delivery Method: Circle system utilized Preoxygenation: Pre-oxygenation with 100% oxygen Intubation Type: IV induction Ventilation: Mask ventilation without difficulty LMA: LMA inserted LMA Size: 4.0 Number of attempts: 1 Placement Confirmation: positive ETCO2 and breath sounds checked- equal and bilateral Tube secured with: Tape Dental Injury: Teeth and Oropharynx as per pre-operative assessment

## 2014-10-12 NOTE — Discharge Instructions (Signed)
KEEP [facility]ANDAGE [facility] AND DRY CALL OFFICE FOR F/U APPT [id number] IN 11 DAYS KEEP HAND ELEVATED ABOVE HEART OK TO APPLY ICE TO OPERATIVE AREA CONTACT OFFICE IF ANY WORSENING PAIN OR CONCERNS.
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
## 2014-10-12 NOTE — Anesthesia Preprocedure Evaluation (Addendum)
Anesthesia Evaluation  Patient identified by MRN, date of birth, ID band Patient awake    Reviewed: Allergy & Precautions, NPO status , Patient's Chart, lab work & pertinent test results, reviewed documented beta blocker date and time   Airway Mallampati: II  TM Distance: >3 FB Neck ROM: Full    Dental  (+) Teeth Intact, Dental Advisory Given   Pulmonary neg pulmonary ROS,  breath sounds clear to auscultation  Pulmonary exam normal       Cardiovascular hypertension, Pt. on medications and Pt. on home beta blockers Normal cardiovascular examRhythm:Regular     Neuro/Psych  Headaches,    GI/Hepatic negative GI ROS, Neg liver ROS,   Endo/Other  Morbid obesity  Renal/GU negative Renal ROS     Musculoskeletal   Abdominal   Peds  Hematology negative hematology ROS (+)   Anesthesia Other Findings   Reproductive/Obstetrics                           Anesthesia Physical Anesthesia Plan  ASA: II  Anesthesia Plan: General   Post-op Pain Management:    Induction: Intravenous  Airway Management Planned: LMA  Additional Equipment:   Intra-op Plan:   Post-operative Plan: Extubation in OR  Informed Consent: I have reviewed the patients History and Physical, chart, labs and discussed the procedure including the risks, benefits and alternatives for the proposed anesthesia with the patient or authorized representative who has indicated his/her understanding and acceptance.     Plan Discussed with: CRNA and Surgeon  Anesthesia Plan Comments:        Anesthesia Quick Evaluation

## 2014-10-12 NOTE — Anesthesia Postprocedure Evaluation (Signed)
Anesthesia Post Note  Patient: Kathy White  Procedure(s) Performed: Procedure(s) (LRB): RIGHT RING FINGER AND RIGHT SMALL FINGER WOUND EXPLORATION WITH TENDON REPAIRS (Right)  Anesthesia type: General  Patient location: PACU  Post pain: Pain level controlled  Post assessment: Post-op Vital signs reviewed  Last Vitals: BP 142/93 mmHg  Pulse 52  Temp(Src) 36.7 C (Oral)  Resp 19  Ht 5\' 2"  (1.575 m)  Wt 238 lb 8 oz (108.183 kg)  BMI 43.61 kg/m2  SpO2 99%  Post vital signs: Reviewed  Level of consciousness: sedated  Complications: No apparent anesthesia complications

## 2014-10-13 ENCOUNTER — Encounter (HOSPITAL_COMMUNITY): Payer: Self-pay | Admitting: Orthopedic Surgery

## 2014-10-13 NOTE — Op Note (Signed)
NAMEPRICILA, BRIDGE NO.:  [id number]  MEDICAL RECORD NO.:  [id number]  LOCATION:  [facility]                         FACILITY:  [facility]  PHYSICIAN:  Linna Hoff IV, M.D.DATE OF BIRTH:  1981-02-12  DATE OF PROCEDURE:  10/12/2014 DATE OF DISCHARGE:  10/12/2014                              OPERATIVE REPORT   PREOPERATIVE DIAGNOSIS: 1. Right ring finger laceration with tendon involvement. 2. Right small finger laceration with tendon involvement.  POSTOPERATIVE DIAGNOSIS: 1. Right ring finger laceration with tendon involvement. 2. Right small finger laceration with tendon involvement.  ATTENDING PHYSICIAN:  Linna Hoff, M.D., who was scrubbed and present for the entire procedure.  ASSISTANT SURGEON:  None.  ANESTHESIA:  General via LMA.  PROCEDURE: 1. Right ring finger repair flexor digitorum profundus in zone II. 2. Right ring finger neuroplasty digital nerve, radial and ulnar     digital nerves. 3. Right small finger repair of flexor digitorum superficialis zone     II. 4. Repair of right small finger flexor digitorum profundus zone 2. 5. Right small finger and digital neuroplasty, ulnar and radial     digital nerves.  SURGICAL INDICATIONS:  Ms. Silveri is a right-hand dominant female who sustained a sharp laceration to her right ring and small fingers.  The patient was seen and evaluated in the office today and recommended to undergo the above procedure.  Risks, benefits, and alternatives were discussed in detail with the patient.  Signed informed consent was obtained.  Risks include, but not limited to bleeding, infection, damage to nearby nerves, arteries, or tendons, loss of motion of wrist and digits, incomplete relief of symptoms, tendon rupture, tendon adhesions, loss of motion of the fingers, and need for further surgical intervention.  DESCRIPTION OF PROCEDURE:  The patient was properly identified in the preoperative holding area and marked  with a permanent marker made on the right ring and small fingers to indicate correct operative site.  The patient was brought back to the operating room, placed supine on anesthesia room table.  General anesthesia was administered.  The patient tolerated this well.  A well-padded tourniquet was placed on the right brachium, sealed with 1000 drape.  The right upper extremity was then prepped and draped in normal sterile fashion.  Time-out was called. The correct site was identified, and procedure was then begun. Attention was then turned to the right ring finger.  Bruner incision was made and extended both proximally and distally.  Dissection was then carried down through the skin and subcutaneous tissues.  Digital neuroplasty was then carried out, and the radial and ulnar digital nerves were noted to be in continuity.  Once this was carried out, dissection was then carried out onto the flexor sheath where the FDS was in continuity.  The FDP had complete transection.  Tendon was then retrieved proximally and distally.  The A3 pulley was then incised exposing the flexor sheath.  The leading edge of the A4 pulley was opened exposing the distal insertion.  Following this, 3 modified Kessler 4-0 FiberWire sutures were then placed, a total 6 strands across the repair nicely for the FDP, this was supplemented by 6-0 epitendinous Prolene suture.  Once this was carried out, the wounds were then irrigated.  The skin was then closed using 4-0 Vicryl Rapide suture. Attention was then turned to the small finger where a Bruner incision was made proximally and distally exposing the flexor sheath.  The patient had complete rupture of the FDS and FDP.  Digital neuroplasty was then carried out of the radial and ulnar digital nerves, these nerves were both in continuity.  Deep dissection carried down to expose the FDS insertion, radial and ulnar slips were then repaired with 4-0 FiberWire horizontal  mattress core sutures.  Once this was carried out, the FDP was then repaired with 2 modified Kessler sutures, 4 strand repair with the 6-0 epitendinous suture.  The wound was irrigated.  Skin was then closed using 4-0 Vicryl deep sutures.  Adaptic dressing and sterile compressive bandages were applied.  The patient tolerated the procedure well.  Placed in a well-padded dorsal blocking splint, extubated, and taken to the recovery room in good condition.  POSTPROCEDURAL PLAN:  The patient will be discharged to home, seen back in the office in approximately 11 days for wound check, suture check, and then begin a postoperative zone to flexor tendon repair protocol, outpatient [facility].  We will keep the splint until the first visit.  Should go back in the splint until we get the therapy setup.     Melrose Nakayama, M.D.     FWO/MEDQ  D:  10/12/2014  T:  10/13/2014  Job:  [id number]

## 2014-11-02 ENCOUNTER — Encounter: Payer: Self-pay | Admitting: Occupational Therapy

## 2014-11-02 ENCOUNTER — Ambulatory Visit: Payer: Medicaid Other | Attending: Orthopedic Surgery | Admitting: Occupational Therapy

## 2014-11-02 DIAGNOSIS — M79641 Pain in right hand: Secondary | ICD-10-CM

## 2014-11-02 DIAGNOSIS — R29898 Other symptoms and signs involving the musculoskeletal system: Secondary | ICD-10-CM

## 2014-11-02 DIAGNOSIS — M25641 Stiffness of right hand, not elsewhere classified: Secondary | ICD-10-CM | POA: Diagnosis present

## 2014-11-02 DIAGNOSIS — M6289 Other specified disorders of muscle: Secondary | ICD-10-CM | POA: Insufficient documentation

## 2014-11-02 NOTE — Patient Instructions (Signed)
SPLINT WEAR AND CARE     WEARING SCHEDULE:  Wear splint at ALL times except for hygiene care (May remove finger strap to perform exercises instructed by therapist - see below)   PURPOSE:  To prevent movement and for protection until injury can heal   CARE OF SPLINT:  Keep splint away from heat sources including: stove, radiator or furnace, or a car in sunlight. The splint can melt and will no longer fit you properly  Keep away from pets and children  Clean the splint with rubbing alcohol 1-2 times per day.  * During this time, make sure you also clean your hand/arm as instructed by your therapist and/or perform dressing changes as needed. Then dry hand/arm completely before replacing splint. (When cleaning hand/arm, keep it immobilized in same position until splint is replaced)  PRECAUTIONS/POTENTIAL PROBLEMS: *If you notice or experience increased pain, swelling, numbness, or a lingering reddened area from the splint: Contact your therapist immediately by calling [phone]. You must wear the splint for protection, but we will get you scheduled for adjustments as quickly as possible.  (If only straps or hooks need to be replaced and NO adjustments to the splint need to be made, just call the office ahead and let them know you are coming in)  If you have any medical concerns or signs of infection, please call your doctor immediately   EXERCISES:   Remove finger strap only. Use Lt hand to bend involved fingers at tip joints, middle joints, and both joints together 25 repetitions, 6-8 times per day. Make sure you are not sliding out of splint and make sure you come all the way back to the top of the splint.  On Satutday, August 27th (and NOT before), in addition to above exercises, remove top strap only and try to make a gentle fist and straight back up within the splint without Lt hand, 25 reps, 6-8 times per day.

## 2014-11-02 NOTE — Therapy (Signed)
[facility] [address] [place], [place], [address] Phone: [phone]   Fax:  [phone]  Occupational Therapy Evaluation  Patient Details  Name: Kathy White MRN: [id number] Date of Birth: 12-09-1980 Referring Provider:  Iran Planas, MD  Encounter Date: 11/02/2014      OT End of Session - 11/02/14 1415    Visit Number 1   Number of Visits 4   Authorization Type MCD - awaiting authorization   OT Start Time 1105   OT Stop Time 1230   OT Time Calculation (min) 85 min   Equipment Utilized During Treatment splint   Activity Tolerance Patient tolerated treatment well      Past Medical History  Diagnosis Date  . Worsening headaches   . HPV (human papilloma virus) infection   . Obesity   . Abnormal Pap smear   . UTI (lower urinary tract infection)   . Swelling of both ankles 08/01/2012  . IUD (intrauterine device) in place 02/26/2013  . FH: breast cancer in first degree relative 02/26/2013  . Hypertension   . Hematuria 04/21/2013  . Vaginal Pap smear, abnormal     Past Surgical History  Procedure Laterality Date  . Cholecystectomy    . Cesarean section    . Cervical biopsy  w/ loop electrode excision    . Nerve, tendon and artery repair Right 10/12/2014    Procedure: RIGHT RING FINGER AND RIGHT SMALL FINGER WOUND EXPLORATION WITH TENDON REPAIRS;  Surgeon: Iran Planas, MD;  Location: [facility];  Service: Orthopedics;  Laterality: Right;    There were no vitals filed for this visit.  Visit Diagnosis:  Stiffness of joint, hand, right  Weakness of right hand  Pain in joint, hand, right      Subjective Assessment - 11/02/14 1117    Patient is accompained by: Family member  sister   Pertinent History s/p flexor tendon repair zone II Rt small and ring fingers on 10/12/14   Currently in Pain? Yes   Pain Score 1    Pain Location Finger (Comment which one)  small and ring fingers   Pain Orientation Right    Pain Descriptors / Indicators Tingling;Throbbing   Pain Type Surgical pain   Pain Onset 1 to 4 weeks ago   Pain Frequency Intermittent   Aggravating Factors  pushing on the fingers at stitch sites   Pain Relieving Factors OTC meds           [facility] OT Assessment - 11/02/14 0001    Assessment   Diagnosis s/p flexor tendon repair zone II Rt ring and small fingers   Onset Date 10/12/14  surgery date   Assessment Pt arrived fully wrapped and protected and is 3 weeks post-op as of today. Pt with index finger free and ok to leave index finger free per MD orders   Prior Therapy none   Precautions   Precautions Other (comment)   Precaution Comments per protocol s/p flexor tendon repair (zone II), index finger ok free of splint per MD orders   Required Braces or Orthoses Other Brace/Splint   Other Brace/Splint dorsal block splint per protocol   Home  Environment   Lives With Family  (sister, 36 y.o. son, and 75 y.o. 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 Pt instructed to begin 3 1/2 week protocol on Saturday (11/06/14) including P/ROM and A/ROM within restraints of DBS. Therapist demo but instructed pt not to start until Saturday.    Splinting   Splinting Fabricated and  fitted dorsal block splint (DBS) per protocol with wrist in 20* flexion, MP's in 70* flexion, and IP's straight (index finger ok to be free per MD orders, and was free upon arrival). Issued splint               OT Education - 11/02/14 1228    Education provided Yes   Education Details splint wear and care, precautions, and initial 3 1/2 week post-op protocol (Modified Duran)   Person(s) Educated Patient  and family   Methods Explanation;Demonstration;Handout   Comprehension Verbalized understanding;Returned demonstration          OT Short Term Goals - 11/02/14 1423    OT SHORT TERM GOAL #1   Title Independent w/ splint wear and care    Baseline issued, may need adjustments   Time 4   Period Weeks   Status On-going   OT SHORT TERM GOAL #2   Title Independent with initial HEP    Baseline issued, will need updated per protocol   Time 4   Period Weeks   Status On-going           OT Long Term Goals - 11/02/14 1424    OT LONG TERM GOAL #1   Title Independent with strengthening HEP    Baseline DEPENDENT d/t current precuations   Time 8   Period Weeks   Status New   OT LONG TERM GOAL #2   Title Pt to return to using Rt hand as dominant hand for school related tasks and ADLS    Baseline Dependent, using Lt hand   Time 8   Period Weeks   Status New   OT LONG TERM GOAL #3   Title Pt to have 90% gross finger flexion and extension Rt hand for functional grasping/releasing   Baseline dependent d/t current precautions   Time 8   Period Weeks   Status New   OT LONG TERM GOAL #4   Title Pain less than or equal to 2/10 with all functional tasks   Baseline 1-2 at rest   Time 8   Period Weeks   Status New               Plan - 11/02/14 1417    Clinical Impression Statement Pt is a 34 y.o. female who presents to outpatient rehab fully wrapped and protected (thumb and index finger free) s/p flexor tendon repair, zone II Rt dominant ring and small fingers (10272)  with surgery on 10/12/14. Pt issued dorsal block splint and shown initial HEP today   Pt will benefit from skilled therapeutic intervention  in order to improve on the following deficits (Retired) Decreased range of motion;Impaired flexibility;Increased edema;Impaired sensation;Decreased knowledge of precautions;Decreased skin integrity;Decreased scar mobility;Impaired UE functional use;Pain;Decreased strength   Rehab Potential Fair   Clinical Impairments Affecting Rehab Potential MCD visit limit   OT Frequency --  will request 3 visits over 8 weeks from [facility], however recommending more   OT Treatment/Interventions Self-care/ADL training;Therapeutic exercise;Patient/family education;Manual Therapy;Splinting;Therapeutic exercises;DME and/or AE instruction;Parrafin;Therapeutic activities;Electrical Stimulation;Fluidtherapy;Scar mobilization;Moist Heat;Passive range of motion   Plan splint adjustments prn, review initial HEP, begin A/ROM outside splint on 11/12/14 or after per protocol   OT Home Exercise Plan 11/02/14: splint wear and care, precautions, and initial HEP per protocol   Consulted and Agree with Plan of Care Patient        Problem List Patient Active Problem List   Diagnosis Date Noted  . Hematuria 04/21/2013  . Hypertension 03/31/2013  . IUD (intrauterine device) in place 02/26/2013  . FH: breast cancer in first degree relative 02/26/2013  . Swelling of both ankles 08/01/2012  . Obesity 07/31/2012  . Migraine 07/31/2012    Carey Bullocks, OTR/L 11/02/2014, 2:27 PM  [facility] [address] [place], [place], [address] Phone: [phone]   Fax:  [phone]

## 2014-11-16 ENCOUNTER — Ambulatory Visit: Payer: Medicaid Other | Attending: Orthopedic Surgery | Admitting: Occupational Therapy

## 2014-11-16 ENCOUNTER — Encounter: Payer: Self-pay | Admitting: Occupational Therapy

## 2014-11-16 DIAGNOSIS — M25641 Stiffness of right hand, not elsewhere classified: Secondary | ICD-10-CM | POA: Insufficient documentation

## 2014-11-16 DIAGNOSIS — M6289 Other specified disorders of muscle: Secondary | ICD-10-CM | POA: Diagnosis present

## 2014-11-16 NOTE — Patient Instructions (Signed)
  EXERCISES:   *Take off splint to perform below exercises every hour and half (approx. 6-8 times/day), 20 reps each  1. Bend wrist and fingers down, followed by bringing wrist up while opening fingers.  2. Make fist, then straighten big knuckles, then straighten fingers 3. Make fist and keep fist while bending wrist up and down (Continue bending fingers down, then straightening as much as possible with other hand IN splint)   SCAR MASSAGE:   Use cocoa butter or vitamin E cream  Deep circular massage along incision sites for 5 minutes, 2x/day

## 2014-11-16 NOTE — Therapy (Signed)
[facility] [address] [place], [place], [address] Phone: [phone]   Fax:  [phone]  Occupational Therapy Treatment  Patient Details  Name: Kathy White MRN: [id number] Date of Birth: 12-30-1980 Referring Provider:  Leamon Arnt, MD  Encounter Date: 11/16/2014      OT End of Session - 11/16/14 1059    Visit Number 2   Authorization Type MCD - awaiting authorization   OT Start Time 9242   OT Stop Time 1055   OT Time Calculation (min) 40 min   Activity Tolerance Patient tolerated treatment well      Past Medical History  Diagnosis Date  . Worsening headaches   . HPV (human papilloma virus) infection   . Obesity   . Abnormal Pap smear   . UTI (lower urinary tract infection)   . Swelling of both ankles 08/01/2012  . IUD (intrauterine device) in place 02/26/2013  . FH: breast cancer in first degree relative 02/26/2013  . Hypertension   . Hematuria 04/21/2013  . Vaginal Pap smear, abnormal     Past Surgical History  Procedure Laterality Date  . Cholecystectomy    . Cesarean section    . Cervical biopsy  w/ loop electrode excision    . Nerve, tendon and artery repair Right 10/12/2014    Procedure: RIGHT RING FINGER AND RIGHT SMALL FINGER WOUND EXPLORATION WITH TENDON REPAIRS;  Surgeon: Iran Planas, MD;  Location: [facility];  Service: Orthopedics;  Laterality: Right;    There were no vitals filed for this visit.  Visit Diagnosis:  Stiffness of joint, hand, right      Subjective Assessment - 11/16/14 1024    Subjective  I did do my exercises that you told me in the splint   Patient is accompained by: Family member  sister   Pertinent History s/p flexor tendon repair zone II Rt small and ring fingers on 10/12/14   Currently in Pain? No/denies                      OT Treatments/Exercises (OP) - 11/16/14 0001    ADLs   ADL Comments Pt shown scar massage and instructed how to perform at  home. (5 minutes, 2x/day along incision)   Hand Exercises   Other Hand Exercises Pt shown initial A/ROM exercises outside splint per 4 1/2 week post-op protocol. Pt demo each x 10 reps. (see pt instructions for details)   Splinting   Splinting Applied new hook and finger strap to DBS.                 OT Education - 11/16/14 1053    Education provided Yes   Education Details A/ROM outside splint HEP (per 4 1/2 week post-op protocol), scar massage   Person(s) Educated Patient   Methods Explanation;Demonstration;Handout   Comprehension Verbalized understanding;Returned demonstration          OT Short Term Goals - 11/16/14 1103    OT SHORT TERM GOAL #1   Title Independent w/ splint wear and care    Baseline issued, may need adjustments   Time 4   Period Weeks   Status Achieved   OT SHORT TERM GOAL #2   Title Independent with initial HEP    Baseline issued, will need updated per protocol   Time 4   Period Weeks   Status Achieved           OT Long Term Goals - 11/02/14  [place] #1   Title Independent with strengthening HEP    Baseline DEPENDENT d/t current precuations   Time 8   Period Weeks   Status New   OT LONG TERM GOAL #2   Title Pt to return to using Rt hand as dominant hand for school related tasks and ADLS    Baseline Dependent, using Lt hand   Time 8   Period Weeks   Status New   OT LONG TERM GOAL #3   Title Pt to have 90% gross finger flexion and extension Rt hand for functional grasping/releasing   Baseline dependent d/t current precautions   Time 8   Period Weeks   Status New   OT LONG TERM GOAL #4   Title Pain less than or equal to 2/10 with all functional tasks   Baseline 1-2 at rest   Time 8   Period Weeks   Status New               Plan - 11/16/14 1059    Clinical Impression Statement Pt progressing per protocol and doing well with no pain. Pt met all STG's   Plan next session on 11/25/14: d/c splint per  protocol, show passive extension and blocking exercises (except no blocking small finger DIP joint) per protocol, pm extension splint for PIP extension, ultrasound. **Check MCD authorization   OT Home Exercise Plan 11/02/14: splint wear and care, precautions, and initial HEP per protocol. Progressed HEP 11/16/14 per 4 1/2 week protocol.    Consulted and Agree with Plan of Care Patient        Problem List Patient Active Problem List   Diagnosis Date Noted  . Hematuria 04/21/2013  . Hypertension 03/31/2013  . IUD (intrauterine device) in place 02/26/2013  . FH: breast cancer in first degree relative 02/26/2013  . Swelling of both ankles 08/01/2012  . Obesity 07/31/2012  . Migraine 07/31/2012    Carey Bullocks, OTR/L 11/16/2014, 11:04 AM  [facility] [address] [place], [place], [address] Phone: [phone]   Fax:  [phone]

## 2014-11-25 ENCOUNTER — Encounter: Payer: Self-pay | Admitting: Occupational Therapy

## 2014-11-25 ENCOUNTER — Ambulatory Visit: Payer: Medicaid Other | Admitting: Occupational Therapy

## 2014-11-25 DIAGNOSIS — M25641 Stiffness of right hand, not elsewhere classified: Secondary | ICD-10-CM

## 2014-11-25 NOTE — Therapy (Signed)
[facility] [address] [place], [place], [address] Phone: [phone]   Fax:  [phone]  Occupational Therapy Treatment  Patient Details  Name: Kathy White MRN: [id number] Date of Birth: Dec 10, 1980 Referring Provider:  Leamon Arnt, MD  Encounter Date: 11/25/2014      OT End of Session - 11/25/14 0923    Visit Number 3   Number of Visits 5   Authorization Type MCD   Authorization Time Period Approved 11/22/14 - 02/20/15   Authorization - Visit Number 1   Authorization - Number of Visits 3   OT Start Time 0800   OT Stop Time 0930   OT Time Calculation (min) 90 min   Activity Tolerance Patient tolerated treatment well      Past Medical History  Diagnosis Date  . Worsening headaches   . HPV (human papilloma virus) infection   . Obesity   . Abnormal Pap smear   . UTI (lower urinary tract infection)   . Swelling of both ankles 08/01/2012  . IUD (intrauterine device) in place 02/26/2013  . FH: breast cancer in first degree relative 02/26/2013  . Hypertension   . Hematuria 04/21/2013  . Vaginal Pap smear, abnormal     Past Surgical History  Procedure Laterality Date  . Cholecystectomy    . Cesarean section    . Cervical biopsy  w/ loop electrode excision    . Nerve, tendon and artery repair Right 10/12/2014    Procedure: RIGHT RING FINGER AND RIGHT SMALL FINGER WOUND EXPLORATION WITH TENDON REPAIRS;  Surgeon: Iran Planas, MD;  Location: [facility];  Service: Orthopedics;  Laterality: Right;    There were no vitals filed for this visit.  Visit Diagnosis:  Stiffness of joint, hand, right      Subjective Assessment - 11/25/14 0805    Subjective  I've been doing my exercises outside the splint   Pertinent History s/p flexor tendon repair zone II Rt small and ring fingers on 10/12/14   Currently in Pain? No/denies                      OT Treatments/Exercises (OP) - 11/25/14 0001    Hand  Exercises   Other Hand Exercises Pt shown finger exercises including passive PIP extension and blocking exercises per 6 week post-op protocol (see pt instructions for details). Pt instructed not to perform blocking to DIP joint small finger secondary to risk of rupture. Pt agreed. Pt return demo of all exercises   Modalities   Modalities Fluidotherapy   RUE Fluidotherapy   Number Minutes Fluidotherapy 10 Minutes   RUE Fluidotherapy Location Hand;Wrist   Comments near end of session   Splinting   Splinting Fabricated and fitted hand based pm splint for ring and small finger PIP extension (for prolonged stretch). Issued splint and reviewed wear and care   Manual Therapy   Manual therapy comments scar extractor applied to manage scar tissue. Pt demo mobility at scar/skin and reports she has been doing scar massage. Looks mobile with limited scar tissue.                 OT Education - 11/25/14 0910    Education provided Yes   Education Details updated HEP (per 6 week post-op protocol), pm splint wear and care   Person(s) Educated Patient   Methods Explanation;Demonstration;Handout   Comprehension Verbalized understanding;Returned demonstration          OT Short Term  Goals - 11/16/14 1103    OT SHORT TERM GOAL #1   Title Independent w/ splint wear and care    Baseline issued, may need adjustments   Time 4   Period Weeks   Status Achieved   OT SHORT TERM GOAL #2   Title Independent with initial HEP    Baseline issued, will need updated per protocol   Time 4   Period Weeks   Status Achieved           OT Long Term Goals - 11/02/14 1424    OT LONG TERM GOAL #1   Title Independent with strengthening HEP    Baseline DEPENDENT d/t current precuations   Time 8   Period Weeks   Status New   OT LONG TERM GOAL #2   Title Pt to return to using Rt hand as dominant hand for school related tasks and ADLS    Baseline Dependent, using Lt hand   Time 8   Period Weeks    Status New   OT LONG TERM GOAL #3   Title Pt to have 90% gross finger flexion and extension Rt hand for functional grasping/releasing   Baseline dependent d/t current precautions   Time 8   Period Weeks   Status New   OT LONG TERM GOAL #4   Title Pain less than or equal to 2/10 with all functional tasks   Baseline 1-2 at rest   Time [place] - 11/25/14 6010    Clinical Impression Statement Pt progressing per protocol with no concerns. Pt with minimal PIP extension lag Rt ring and small finger, which pm extension splint will assist with. Pt does not appear to have any DIP flexion small finger, and minimal DIP flexion ring fnger.    Plan Pt to return 12/09/14 to progress to light strengthening with putty per 8 week protocol, assess ROM and grip strength   OT Home Exercise Plan 11/02/14: splint wear and care, precautions, and initial HEP per protocol. Progressed HEP 11/16/14 per 4 1/2 week protocol. Progressed HEP 11/25/14 per 6 week post-op protocol    Consulted and Agree with Plan of Care Patient        Problem List Patient Active Problem List   Diagnosis Date Noted  . Hematuria 04/21/2013  . Hypertension 03/31/2013  . IUD (intrauterine device) in place 02/26/2013  . FH: breast cancer in first degree relative 02/26/2013  . Swelling of both ankles 08/01/2012  . Obesity 07/31/2012  . Migraine 07/31/2012    Carey Bullocks, OTR/L 11/25/2014, 11:52 AM  [facility] [address] [place], [place], [address] Phone: [phone]   Fax:  [phone]

## 2014-11-25 NOTE — Patient Instructions (Signed)
         FINGER EXERCISES:   1. Passively make tight fist using other hand to assist, then remove other hand and hold in position 5 sec. Repeat 10 times, 6 sessions/day 2. Block big knuckle in straight position of ring finger, bend and straighten middle joint with focus on bending. Repeat with small finger x 10 reps, 6 sessions/day 3. Block big knuckle in bent position of ring finger, bend and straighten middle joint with focus on straightening. Repeat with small finger, x 10 reps, 6 sessions/day 4. Block middle knuckle in straight position of ring finger ONLY, bend and straighten tip joint (do NOT do with small finger), repeat 10 times, 6 sessions/day  5. PIP Extension (Passive   Use thumb of other hand on top of joint and two fingers under- neath on either side to straighten middle joint of ___ring, then small___ finger. Hold __10-20__ seconds. Repeat _5___ times. Do _6___ sessions per day.  Copyright  VHI. All rights reserved.

## 2014-12-09 ENCOUNTER — Encounter: Payer: Self-pay | Admitting: Occupational Therapy

## 2014-12-09 ENCOUNTER — Ambulatory Visit: Payer: Medicaid Other | Admitting: Occupational Therapy

## 2014-12-09 DIAGNOSIS — M25641 Stiffness of right hand, not elsewhere classified: Secondary | ICD-10-CM | POA: Diagnosis not present

## 2014-12-09 DIAGNOSIS — R29898 Other symptoms and signs involving the musculoskeletal system: Secondary | ICD-10-CM

## 2014-12-09 NOTE — Patient Instructions (Signed)
1. Grip Strengthening (Resistive Putty)   Squeeze putty using thumb and all fingers. Repeat _15___ times. Do __2__ sessions per day.   2. IP Fisting (Resistive Putty)   Keeping knuckles straight, bend fingertips to squeeze putty. Repeat _15___ times. Do __2__ sessions per day.    3. Extension (Resistive Putty)   Place putty loop around fingers. Stretch loop by opening hand at large knuckles only. Keep thumb still and finger- tips straight. Repeat _15___ times. Do __2__ sessions per day. **Can do with rubberband    4. Roll putty into tube on table and pinch between each finger and thumb x 10 reps each, 2 sessions per day. (DO ring and small finger together)

## 2014-12-09 NOTE — Therapy (Signed)
[facility] [address] [place], [place], [address] Phone: [phone]   Fax:  [phone]  Occupational Therapy Treatment  Patient Details  Name: Kathy White MRN: [id number] Date of Birth: [birth year] Referring Provider:  Leamon Arnt, MD  Encounter Date: 12/09/2014      OT End of Session - 12/09/14 0928    Visit Number 4   Number of Visits 5   Authorization Type MCD   Authorization Time Period Approved 11/22/14 - 02/20/15   Authorization - Visit Number 2   Authorization - Number of Visits 3   OT Start Time 0850   OT Stop Time 0930   OT Time Calculation (min) 40 min   Activity Tolerance Patient tolerated treatment well      Past Medical History  Diagnosis Date  . Worsening headaches   . HPV (human papilloma virus) infection   . Obesity   . Abnormal Pap smear   . UTI (lower urinary tract infection)   . Swelling of both ankles 08/01/2012  . IUD (intrauterine device) in place 02/26/2013  . FH: breast cancer in first degree relative 02/26/2013  . Hypertension   . Hematuria 04/21/2013  . Vaginal Pap smear, abnormal     Past Surgical History  Procedure Laterality Date  . Cholecystectomy    . Cesarean section    . Cervical biopsy  w/ loop electrode excision    . Nerve, tendon and artery repair Right 10/12/2014    Procedure: RIGHT RING FINGER AND RIGHT SMALL FINGER WOUND EXPLORATION WITH TENDON REPAIRS;  Surgeon: Iran Planas, MD;  Location: [facility];  Service: Orthopedics;  Laterality: Right;    There were no vitals filed for this visit.  Visit Diagnosis:  Stiffness of joint, hand, right  Weakness of right hand      Subjective Assessment - 12/09/14 0857    Subjective  I've been doing all my ex's and wearing my splint every night   Pertinent History s/p flexor tendon repair zone II Rt small and ring fingers on 10/12/14   Patient Stated Goals Get my hand back to normal   Currently in Pain? No/denies             [facility] OT Assessment - 12/09/14 0001    Right Hand AROM   R Ring  MCP 0-90 --  WNL's   R Ring PIP 0-100 --  flex = 85*, ext = 0   R Ring DIP 0-70 --  DIP flex = 5*, ext = 0   R Little  MCP 0-90 --  WNL's   R Little PIP 0-100 --  PIP flex = 70*, ext = -5*   Hand Function   Right Hand Grip (lbs) 28 lbs   Left Hand Grip (lbs) 52 lbs   Comment Gross finger flexion approx. 90%, MP ROM WNL's                  OT Treatments/Exercises (OP) - 12/09/14 0001    ADLs   ADL Comments Assessed ROM and grip strength (see assessment). Reviewed previous HEP's and what to continue. Reviewed progress to date and progress towards LTG's.    Hand Exercises   Other Hand Exercises Pt shown putty exercises in mass composite finger flexion x 15 reps, intrinsic (-) x 15 reps, finger extension x 15 reps, and tip pinch x 10 reps each (see pt instructions)  OT Education - 12/09/14 [phone]    Education provided Yes   Education Details Strengthening HEP (red putty)   Person(s) Educated Patient   Methods Explanation;Demonstration;Handout   Comprehension Verbalized understanding;Returned demonstration          OT Short Term Goals - 11/16/14 1103    OT SHORT TERM GOAL #1   Title Independent w/ splint wear and care    Baseline issued, may need adjustments   Time 4   Period Weeks   Status Achieved   OT SHORT TERM GOAL #2   Title Independent with initial HEP    Baseline issued, will need updated per protocol   Time 4   Period Weeks   Status Achieved           OT Long Term Goals - 12/09/14 0905    OT LONG TERM GOAL #1   Title Independent with strengthening HEP    Baseline DEPENDENT d/t current precuations   Time 8   Period Weeks   Status Achieved   OT LONG TERM GOAL #2   Title Pt to return to using Rt hand as dominant hand for school related tasks and ADLS    Baseline Dependent, using Lt hand   Time 8   Period Weeks   Status New   OT LONG TERM  GOAL #3   Title Pt to have 90% gross finger flexion and extension Rt hand for functional grasping/releasing   Baseline dependent d/t current precautions   Time 8   Period Weeks   Status Achieved   OT LONG TERM GOAL #4   Title Pain less than or equal to 2/10 with all functional tasks   Baseline 1-2 at rest   Time 8   Period Weeks   Status New               Plan - 12/09/14 0256    Clinical Impression Statement Pt met LTG's #1 and #3 today. Pt progressing per protocol   Plan Re-assess grip strength, ROM, assess remaining goals and d/c    OT Home Exercise Plan 11/02/14: splint wear and care, precautions, and initial HEP per protocol. Progressed HEP 11/16/14 per 4 1/2 week protocol. Progressed HEP 11/25/14 per 6 week post-op protocol. 12/09/14: strengthening HEP   Consulted and Agree with Plan of Care Patient        Problem List Patient Active Problem List   Diagnosis Date Noted  . Hematuria 04/21/2013  . Hypertension 03/31/2013  . IUD (intrauterine device) in place 02/26/2013  . FH: breast cancer in first degree relative 02/26/2013  . Swelling of both ankles 08/01/2012  . Obesity 07/31/2012  . Migraine 07/31/2012    Carey Bullocks, OTR/L  12/09/2014, 9:35 AM  [facility] [address] [place], [place], [address] Phone: [phone]   Fax:  [phone]

## 2014-12-23 ENCOUNTER — Ambulatory Visit: Payer: Medicaid Other | Attending: Orthopedic Surgery | Admitting: Occupational Therapy

## 2014-12-23 ENCOUNTER — Encounter: Payer: Self-pay | Admitting: Occupational Therapy

## 2014-12-23 DIAGNOSIS — M25641 Stiffness of right hand, not elsewhere classified: Secondary | ICD-10-CM | POA: Diagnosis present

## 2014-12-23 DIAGNOSIS — M6289 Other specified disorders of muscle: Secondary | ICD-10-CM | POA: Insufficient documentation

## 2014-12-23 DIAGNOSIS — R29898 Other symptoms and signs involving the musculoskeletal system: Secondary | ICD-10-CM

## 2014-12-23 NOTE — Patient Instructions (Signed)
  Coordination Activities  Perform the following activities for 5-10 minutes 1 times per day with right hand(s).   Pick up coins one at a time until you get 5-10 in your hand, then move coins from palm to fingertips to stack one at a time.   Highlighter rolling exercise for fingers (start at fingertips then roll down to bottom of palm and slowly back up, keeping ring and small finger on highlighter). When easy, go to using Sharpie  Upgrade to green putty for squeezing in 2-3 weeks or when easy with red putty

## 2014-12-23 NOTE — Therapy (Signed)
[facility] [address] [place], [place], [address] Phone: [phone]   Fax:  [phone]  Occupational Therapy Treatment  Patient Details  Name: Kathy White MRN: [id number] Date of Birth: 09-26-1980 Referring Provider:  Leamon Arnt, MD  Encounter Date: 12/23/2014      OT End of Session - 12/23/14 1616    Visit Number 5   Number of Visits 5   Authorization Type MCD   Authorization Time Period Approved 11/22/14 - 02/20/15   Authorization - Visit Number 3   Authorization - Number of Visits 3   OT Start Time 6503   OT Stop Time 5465   OT Time Calculation (min) 30 min   Activity Tolerance Patient tolerated treatment well      Past Medical History  Diagnosis Date  . Worsening headaches   . HPV (human papilloma virus) infection   . Obesity   . Abnormal Pap smear   . UTI (lower urinary tract infection)   . Swelling of both ankles 08/01/2012  . IUD (intrauterine device) in place 02/26/2013  . FH: breast cancer in first degree relative 02/26/2013  . Hypertension   . Hematuria 04/21/2013  . Vaginal Pap smear, abnormal     Past Surgical History  Procedure Laterality Date  . Cholecystectomy    . Cesarean section    . Cervical biopsy  w/ loop electrode excision    . Nerve, tendon and artery repair Right 10/12/2014    Procedure: RIGHT RING FINGER AND RIGHT SMALL FINGER WOUND EXPLORATION WITH TENDON REPAIRS;  Surgeon: Iran Planas, MD;  Location: [facility];  Service: Orthopedics;  Laterality: Right;    There were no vitals filed for this visit.  Visit Diagnosis:  Stiffness of joint, hand, right  Weakness of right hand      Subjective Assessment - 12/23/14 1540    Subjective  I've been doing all my ex's and wearing my splint every night   Pertinent History s/p flexor tendon repair zone II Rt small and ring fingers on 10/12/14   Patient Stated Goals Get my hand back to normal   Currently in Pain? No/denies            [facility] OT Assessment - 12/23/14 0001    Right Hand AROM   R Ring PIP 0-100 --  FLEX = 90*, EXT = 0   R Ring DIP 0-70 --  DIP flex = 5*, ext = 0   R Little PIP 0-100 --  PIP flex = 70*, ext = -5   Hand Function   Right Hand Grip (lbs) 30 lbs                  OT Treatments/Exercises (OP) - 12/23/14 0001    ADLs   ADL Comments Reviewed/assessed LTG's and progress to date and discussed with patient. (see assessment and LTG's)   Hand Exercises   Other Hand Exercises Pt issued green resistance putty to upgrade to in 2-3 weeks. Pt also shown 2 additional ex's for ROM and functional use/coordination. (see pt instructions)                 OT Education - 12/23/14 1607    Education provided Yes   Education Details Additional HEP   Person(s) Educated Patient   Methods Explanation;Demonstration;Handout   Comprehension Verbalized understanding;Returned demonstration          OT Short Term Goals - 11/16/14 1103    OT SHORT TERM GOAL #  1   Title Independent w/ splint wear and care    Baseline issued, may need adjustments   Time 4   Period Weeks   Status Achieved   OT SHORT TERM GOAL #2   Title Independent with initial HEP    Baseline issued, will need updated per protocol   Time 4   Period Weeks   Status Achieved           OT Long Term Goals - 12/23/14 1616    OT LONG TERM GOAL #1   Title Independent with strengthening HEP    Baseline DEPENDENT d/t current precuations   Time 8   Period Weeks   Status Achieved   OT LONG TERM GOAL #2   Title Pt to return to using Rt hand as dominant hand for school related tasks and ADLS    Baseline Dependent, using Lt hand   Time 8   Period Weeks   Status Achieved   OT LONG TERM GOAL #3   Title Pt to have 90% gross finger flexion and extension Rt hand for functional grasping/releasing   Baseline dependent d/t current precautions   Time 8   Period Weeks   Status Achieved   OT LONG TERM GOAL #4    Title Pain less than or equal to 2/10 with all functional tasks   Baseline 1-2 at rest   Time 8   Period Weeks   Status Achieved               Plan - 12/23/14 1617    Clinical Impression Statement Pt met all STG's and LTG's. Pt has returned to using Rt hand functionally for all ADLS including writing. Pt still lacks for PIP/DIP motion in 4th and 5th digits Rt hand, but not impeding functional tasks at this time.    Plan D/C O.T.    OT Home Exercise Plan 11/02/14: splint wear and care, precautions, and initial HEP per protocol. Progressed HEP 11/16/14 per 4 1/2 week protocol. Progressed HEP 11/25/14 per 6 week post-op protocol. 12/09/14: strengthening HEP   Consulted and Agree with Plan of Care Patient        Problem List Patient Active Problem List   Diagnosis Date Noted  . Hematuria 04/21/2013  . Hypertension 03/31/2013  . IUD (intrauterine device) in place 02/26/2013  . FH: breast cancer in first degree relative 02/26/2013  . Swelling of both ankles 08/01/2012  . Obesity 07/31/2012  . Migraine 07/31/2012      OCCUPATIONAL THERAPY DISCHARGE SUMMARY  Visits from Start of Care: 5  Current functional level related to goals / functional outcomes: SEE ABOVE    Remaining deficits: ROM 4TH AND 5TH DIGITS STRENGTH RT HAND   Education / Equipment: Splint wear and care, HEP's  Plan: Patient agrees to discharge.  Patient goals were met. Patient is being discharged due to meeting the stated rehab goals.  ?????       Carey Bullocks, OTR/L 12/23/2014, 4:19 PM  [facility] [address] [place], [place], [address] Phone: [phone]   Fax:  [phone]

## 2015-01-28 ENCOUNTER — Other Ambulatory Visit: Payer: Self-pay | Admitting: Orthopedic Surgery

## 2015-03-16 ENCOUNTER — Encounter (HOSPITAL_BASED_OUTPATIENT_CLINIC_OR_DEPARTMENT_OTHER): Payer: Self-pay | Admitting: *Deleted

## 2015-03-16 NOTE — Progress Notes (Addendum)
NPO AFTER MN WITH EXCEPTION CLEAR LIQUIDS UNTIL 0800 (NO CREAM/ MILK PRODUCTS).  ARRIVE AT 1215.  NEEDS ISTAT ,  EKG, AND  URINE PREG.  WILL TAKE PROPANOLOL AM DOS W/ SIPS OF WATER.

## 2015-03-22 ENCOUNTER — Other Ambulatory Visit: Payer: Self-pay

## 2015-03-22 ENCOUNTER — Encounter (HOSPITAL_BASED_OUTPATIENT_CLINIC_OR_DEPARTMENT_OTHER): Payer: Self-pay | Admitting: *Deleted

## 2015-03-22 ENCOUNTER — Ambulatory Visit (HOSPITAL_BASED_OUTPATIENT_CLINIC_OR_DEPARTMENT_OTHER): Payer: Medicaid Other | Admitting: Anesthesiology

## 2015-03-22 ENCOUNTER — Encounter (HOSPITAL_BASED_OUTPATIENT_CLINIC_OR_DEPARTMENT_OTHER)
Admission: RE | Disposition: A | Discharge status: Home / Self Care | Payer: Self-pay | Source: Ambulatory Visit | Attending: Specialist

## 2015-03-22 ENCOUNTER — Ambulatory Visit (HOSPITAL_BASED_OUTPATIENT_CLINIC_OR_DEPARTMENT_OTHER)
Admission: RE | Admit: 2015-03-22 | Discharge: 2015-03-22 | Disposition: A | Discharge status: Home / Self Care | Payer: Medicaid Other | Source: Ambulatory Visit | Attending: Specialist | Admitting: Specialist

## 2015-03-22 DIAGNOSIS — I10 Essential (primary) hypertension: Secondary | ICD-10-CM | POA: Diagnosis not present

## 2015-03-22 DIAGNOSIS — S83282A Other tear of lateral meniscus, current injury, left knee, initial encounter: Secondary | ICD-10-CM | POA: Diagnosis not present

## 2015-03-22 DIAGNOSIS — M2242 Chondromalacia patellae, left knee: Secondary | ICD-10-CM | POA: Insufficient documentation

## 2015-03-22 DIAGNOSIS — X501XXA Overexertion from prolonged static or awkward postures, initial encounter: Secondary | ICD-10-CM | POA: Insufficient documentation

## 2015-03-22 DIAGNOSIS — Z9049 Acquired absence of other specified parts of digestive tract: Secondary | ICD-10-CM | POA: Diagnosis not present

## 2015-03-22 DIAGNOSIS — M1712 Unilateral primary osteoarthritis, left knee: Secondary | ICD-10-CM | POA: Insufficient documentation

## 2015-03-22 DIAGNOSIS — S83512A Sprain of anterior cruciate ligament of left knee, initial encounter: Secondary | ICD-10-CM | POA: Diagnosis present

## 2015-03-22 DIAGNOSIS — Z9889 Other specified postprocedural states: Secondary | ICD-10-CM

## 2015-03-22 DIAGNOSIS — Z79899 Other long term (current) drug therapy: Secondary | ICD-10-CM | POA: Diagnosis not present

## 2015-03-22 HISTORY — DX: Personal history of other diseases of the female genital tract: Z87.42

## 2015-03-22 HISTORY — DX: Presence of spectacles and contact lenses: Z97.3

## 2015-03-22 HISTORY — DX: Osteoarthritis of knee, unspecified: M17.9

## 2015-03-22 HISTORY — DX: Other specified postprocedural states: Z98.890

## 2015-03-22 HISTORY — PX: EXAM UNDER ANESTHESIA WITH MANIPULATION OF KNEE: SHX5816

## 2015-03-22 HISTORY — DX: Sprain of anterior cruciate ligament of left knee, initial encounter: S83.512A

## 2015-03-22 HISTORY — DX: Migraine, unspecified, not intractable, without status migrainosus: G43.909

## 2015-03-22 HISTORY — PX: MENISECTOMY: SHX5181

## 2015-03-22 HISTORY — DX: Unspecified tear of unspecified meniscus, current injury, left knee, initial encounter: S83.207A

## 2015-03-22 HISTORY — PX: KNEE ARTHROSCOPY WITH ANTERIOR CRUCIATE LIGAMENT (ACL) REPAIR WITH HAMSTRING GRAFT: SHX5645

## 2015-03-22 HISTORY — DX: Personal history of other infectious and parasitic diseases: Z86.19

## 2015-03-22 HISTORY — DX: Unilateral primary osteoarthritis, unspecified knee: M17.10

## 2015-03-22 LAB — POCT PREGNANCY, URINE: PREG TEST UR: NEGATIVE

## 2015-03-22 SURGERY — KNEE ARTHROSCOPY WITH ANTERIOR CRUCIATE LIGAMENT (ACL) REPAIR WITH HAMSTRING GRAFT
Anesthesia: General | Site: Knee | Laterality: Left

## 2015-03-22 MED ORDER — PROMETHAZINE HCL 25 MG/ML IJ SOLN
INTRAMUSCULAR | Status: AC
  Filled 2015-03-22: qty 1

## 2015-03-22 MED ORDER — CEPHALEXIN 500 MG PO CAPS: 500.0000 mg | ORAL_CAPSULE | Freq: Three times a day (TID) | ORAL | Status: DC

## 2015-03-22 MED ORDER — SODIUM CHLORIDE 0.9 % IR SOLN
Status: DC | PRN
  Administered 2015-03-22: 24000 mL

## 2015-03-22 MED ORDER — FENTANYL CITRATE (PF) 100 MCG/2ML IJ SOLN
INTRAMUSCULAR | Status: DC | PRN
  Administered 2015-03-22 (×2): 50 ug via INTRAVENOUS

## 2015-03-22 MED ORDER — ONDANSETRON HCL 4 MG PO TABS: 4.0000 mg | ORAL_TABLET | Freq: Three times a day (TID) | ORAL | Status: DC | PRN

## 2015-03-22 MED ORDER — METHOCARBAMOL 500 MG PO TABS: 500.0000 mg | ORAL_TABLET | Freq: Four times a day (QID) | ORAL | Status: DC | PRN

## 2015-03-22 MED ORDER — ONDANSETRON HCL 4 MG/2ML IJ SOLN
INTRAMUSCULAR | Status: DC | PRN
  Administered 2015-03-22: 4 mg via INTRAVENOUS

## 2015-03-22 MED ORDER — ONDANSETRON HCL 4 MG/2ML IJ SOLN
INTRAMUSCULAR | Status: AC
  Filled 2015-03-22: qty 2

## 2015-03-22 MED ORDER — FENTANYL CITRATE (PF) 100 MCG/2ML IJ SOLN
100.0000 ug | Freq: Once | INTRAMUSCULAR | Status: AC
  Administered 2015-03-22: 100 ug via INTRAVENOUS
  Filled 2015-03-22: qty 2

## 2015-03-22 MED ORDER — HYDROMORPHONE HCL 2 MG PO TABS: 2.0000 mg | ORAL_TABLET | ORAL | Status: DC | PRN

## 2015-03-22 MED ORDER — POVIDONE-IODINE 7.5 % EX SOLN
Freq: Once | CUTANEOUS | Status: DC
  Filled 2015-03-22: qty 118

## 2015-03-22 MED ORDER — LACTATED RINGERS IV SOLN
INTRAVENOUS | Status: DC
  Filled 2015-03-22: qty 1000

## 2015-03-22 MED ORDER — PROPOFOL 10 MG/ML IV BOLUS
INTRAVENOUS | Status: AC
  Filled 2015-03-22: qty 20

## 2015-03-22 MED ORDER — FENTANYL CITRATE (PF) 100 MCG/2ML IJ SOLN
INTRAMUSCULAR | Status: AC
  Filled 2015-03-22: qty 2

## 2015-03-22 MED ORDER — MORPHINE SULFATE (PF) 4 MG/ML IV SOLN
INTRAVENOUS | Status: AC
  Filled 2015-03-22: qty 1

## 2015-03-22 MED ORDER — FENTANYL CITRATE (PF) 100 MCG/2ML IJ SOLN
25.0000 ug | INTRAMUSCULAR | Status: DC | PRN
  Administered 2015-03-22: 25 ug via INTRAVENOUS
  Filled 2015-03-22: qty 1

## 2015-03-22 MED ORDER — METHOCARBAMOL 500 MG PO TABS
ORAL_TABLET | ORAL | Status: AC
  Filled 2015-03-22: qty 1

## 2015-03-22 MED ORDER — MIDAZOLAM HCL 2 MG/2ML IJ SOLN
INTRAMUSCULAR | Status: AC
  Filled 2015-03-22: qty 2

## 2015-03-22 MED ORDER — CEFAZOLIN SODIUM-DEXTROSE 2-3 GM-% IV SOLR
INTRAVENOUS | Status: AC
Start: 2015-03-22 — End: 2015-03-22
  Filled 2015-03-22: qty 50

## 2015-03-22 MED ORDER — CEFAZOLIN SODIUM-DEXTROSE 2-3 GM-% IV SOLR
2.0000 g | INTRAVENOUS | Status: AC
  Administered 2015-03-22: 1 g via INTRAVENOUS
  Administered 2015-03-22: 2 g via INTRAVENOUS
  Filled 2015-03-22: qty 50

## 2015-03-22 MED ORDER — DEXAMETHASONE SODIUM PHOSPHATE 4 MG/ML IJ SOLN
INTRAMUSCULAR | Status: DC | PRN
  Administered 2015-03-22: 10 mg via INTRAVENOUS

## 2015-03-22 MED ORDER — BUPIVACAINE-EPINEPHRINE (PF) 0.5% -1:200000 IJ SOLN
INTRAMUSCULAR | Status: DC | PRN
  Administered 2015-03-22: 25 mL via PERINEURAL

## 2015-03-22 MED ORDER — BUPIVACAINE HCL (PF) 0.25 % IJ SOLN
INTRAMUSCULAR | Status: DC | PRN
  Administered 2015-03-22: 30 mL

## 2015-03-22 MED ORDER — LIDOCAINE HCL (CARDIAC) 20 MG/ML IV SOLN
INTRAVENOUS | Status: DC | PRN
  Administered 2015-03-22: 100 mg via INTRAVENOUS

## 2015-03-22 MED ORDER — MEPERIDINE HCL 25 MG/ML IJ SOLN
6.2500 mg | INTRAMUSCULAR | Status: DC | PRN
  Filled 2015-03-22: qty 1

## 2015-03-22 MED ORDER — LIDOCAINE HCL (CARDIAC) 20 MG/ML IV SOLN
INTRAVENOUS | Status: AC
  Filled 2015-03-22: qty 5

## 2015-03-22 MED ORDER — DEXAMETHASONE SODIUM PHOSPHATE 10 MG/ML IJ SOLN
INTRAMUSCULAR | Status: AC
  Filled 2015-03-22: qty 1

## 2015-03-22 MED ORDER — MORPHINE SULFATE (PF) 4 MG/ML IV SOLN
INTRAVENOUS | Status: DC | PRN
  Administered 2015-03-22: 4 mg via INTRAVENOUS

## 2015-03-22 MED ORDER — PROMETHAZINE HCL 25 MG/ML IJ SOLN
6.2500 mg | INTRAMUSCULAR | Status: DC | PRN
  Administered 2015-03-22: 6.25 mg via INTRAVENOUS
  Filled 2015-03-22: qty 1

## 2015-03-22 MED ORDER — PROPOFOL 10 MG/ML IV BOLUS
INTRAVENOUS | Status: DC | PRN
  Administered 2015-03-22: 300 mg via INTRAVENOUS

## 2015-03-22 MED ORDER — MIDAZOLAM HCL 2 MG/2ML IJ SOLN
2.0000 mg | Freq: Once | INTRAMUSCULAR | Status: AC
  Administered 2015-03-22: 2 mg via INTRAVENOUS
  Filled 2015-03-22: qty 2

## 2015-03-22 MED ORDER — LACTATED RINGERS IV SOLN
INTRAVENOUS | Status: DC
  Administered 2015-03-22 (×2): via INTRAVENOUS
  Filled 2015-03-22: qty 1000

## 2015-03-22 MED FILL — CEPHALEXIN 500 MG CAPSULE: 500 | 4 days supply | Qty: 12 | Fill #0

## 2015-03-22 MED FILL — METHOCARBAMOL 500 MG TABLET: 500 | 12 days supply | Qty: 50 | Fill #0

## 2015-03-22 MED FILL — HYDROmorphone HCL 2 MG TABS: 2 | 10 days supply | Qty: 60 | Fill #0

## 2015-03-22 MED FILL — ONDANSETRON HCL 4 MG TABLET: 4 | 6 days supply | Qty: 20 | Fill #0

## 2015-03-22 SURGICAL SUPPLY — 99 items
ADAPTER IRRIG TUBE 2 SPIKE SOL (ADAPTER) ×4 IMPLANT
ANCHOR BUTTON TIGHTROPE ACL RT (Orthopedic Implant) ×2 IMPLANT
ANCHOR PUSHLOCK PEEK 3.5X19.5 (Anchor) ×2 IMPLANT
ARTHREX FLIP CUTTER ×2 IMPLANT
ARTHREX PASSPORT BUTTON CANNULA ×2 IMPLANT
BANDAGE ESMARK 6X9 LF (GAUZE/BANDAGES/DRESSINGS) ×1 IMPLANT
BLADE 4.2CUDA (BLADE) IMPLANT
BLADE CUDA 5.5 (BLADE) ×2 IMPLANT
BLADE CUDA GRT WHITE 3.5 (BLADE) ×2 IMPLANT
BLADE GREAT WHITE 4.2 (BLADE) ×2 IMPLANT
BLADE SURG 10 STRL SS (BLADE) ×2 IMPLANT
BLADE SURG 15 STRL LF DISP TIS (BLADE) ×1 IMPLANT
BLADE SURG 15 STRL SS (BLADE) ×1
BNDG ESMARK 6X9 LF (GAUZE/BANDAGES/DRESSINGS) ×2
BNDG GAUZE ELAST 4 BULKY (GAUZE/BANDAGES/DRESSINGS) ×2 IMPLANT
BUR OVAL 6.0 (BURR) IMPLANT
BUR VERTEX HOODED 4.5 (BURR) ×2 IMPLANT
CANISTER SUCT LVC 12 LTR MEDI- (MISCELLANEOUS) ×10 IMPLANT
CANISTER SUCTION 1200CC (MISCELLANEOUS) ×2 IMPLANT
CANISTER SUCTION 2500CC (MISCELLANEOUS) IMPLANT
CANNULA PASSPORT BUTTON 8X4 (CANNULA) ×2 IMPLANT
COVER BACK TABLE 60X90IN (DRAPES) ×2 IMPLANT
CUTTER KNOT PUSHER 2-0 FIBERWI (INSTRUMENTS) ×2 IMPLANT
DEVICE SPEEDCINCH CVD NDL (Anchor) ×2 IMPLANT
DRAPE ARTHROSCOPY W/POUCH 114 (DRAPES) ×2 IMPLANT
DRAPE INCISE IOBAN 66X45 STRL (DRAPES) ×2 IMPLANT
DRAPE LG THREE QUARTER DISP (DRAPES) ×4 IMPLANT
DRAPE OEC MINIVIEW 54X84 (DRAPES) ×2 IMPLANT
DRAPE U-SHAPE 47X51 STRL (DRAPES) ×2 IMPLANT
DRILL FLIPCUTTER II 8.5MM (INSTRUMENTS) ×1 IMPLANT
DRSG EMULSION OIL 3X3 NADH (GAUZE/BANDAGES/DRESSINGS) ×2 IMPLANT
DRSG PAD ABDOMINAL 8X10 ST (GAUZE/BANDAGES/DRESSINGS) ×2 IMPLANT
DURAPREP 26ML APPLICATOR (WOUND CARE) ×2 IMPLANT
ELECT REM PT RETURN 9FT ADLT (ELECTROSURGICAL) ×2
ELECTRODE REM PT RTRN 9FT ADLT (ELECTROSURGICAL) ×1 IMPLANT
FIBERSTICK 2 (SUTURE) ×2 IMPLANT
FLIPCUTTER II 8.5MM (INSTRUMENTS) ×2
GAUZE XEROFORM 1X8 LF (GAUZE/BANDAGES/DRESSINGS) ×2 IMPLANT
GLOVE BIO SURGEON STRL SZ7.5 (GLOVE) ×2 IMPLANT
GLOVE BIO SURGEON STRL SZ8 (GLOVE) ×4 IMPLANT
GLOVE BIOGEL PI IND STRL 8 (GLOVE) ×2 IMPLANT
GLOVE BIOGEL PI INDICATOR 8 (GLOVE) ×2
GLOVE INDICATOR 8.0 STRL GRN (GLOVE) ×2 IMPLANT
GOWN STRL REUS W/ TWL LRG LVL3 (GOWN DISPOSABLE) ×1 IMPLANT
GOWN STRL REUS W/ TWL XL LVL3 (GOWN DISPOSABLE) ×2 IMPLANT
GOWN STRL REUS W/TWL LRG LVL3 (GOWN DISPOSABLE) ×1
GOWN STRL REUS W/TWL XL LVL3 (GOWN DISPOSABLE) ×2
IMMOBILIZER KNEE 22 UNIV (SOFTGOODS) IMPLANT
IV NS 1000ML (IV SOLUTION) ×12
IV NS 1000ML BAXH (IV SOLUTION) ×12 IMPLANT
IV NS IRRIG 3000ML ARTHROMATIC (IV SOLUTION) ×8 IMPLANT
KIT BUTTON TIGHTROPE ABS 8X12 (Anchor) ×2 IMPLANT
KIT RETRO BUTTON TIGHTROPE ABS (Anchor) ×2 IMPLANT
KIT ROOM TURNOVER WOR (KITS) ×2 IMPLANT
KIT TRANSTIBIAL (DISPOSABLE) IMPLANT
KNEE WRAP E Z 3 GEL PACK (MISCELLANEOUS) ×2 IMPLANT
LOOP 2 FIBERLINK CLOSED (SUTURE) ×2 IMPLANT
MANIFOLD NEPTUNE II (INSTRUMENTS) ×2 IMPLANT
MINI VAC (SURGICAL WAND) IMPLANT
NEEDLE HYPO 22GX1.5 SAFETY (NEEDLE) IMPLANT
NS IRRIG 500ML POUR BTL (IV SOLUTION) ×2 IMPLANT
PACK ARTHROSCOPY DSU (CUSTOM PROCEDURE TRAY) ×2 IMPLANT
PACK BASIN DAY SURGERY FS (CUSTOM PROCEDURE TRAY) ×2 IMPLANT
PAD ABD 8X10 STRL (GAUZE/BANDAGES/DRESSINGS) ×4 IMPLANT
PAD ARMBOARD 7.5X6 YLW CONV (MISCELLANEOUS) IMPLANT
PADDING CAST ABS 4INX4YD NS (CAST SUPPLIES) ×1
PADDING CAST ABS 6INX4YD NS (CAST SUPPLIES) ×1
PADDING CAST ABS COTTON 4X4 ST (CAST SUPPLIES) ×1 IMPLANT
PADDING CAST ABS COTTON 6X4 NS (CAST SUPPLIES) ×1 IMPLANT
PENCIL BUTTON HOLSTER BLD 10FT (ELECTRODE) ×2 IMPLANT
SET ARTHROSCOPY TUBING (MISCELLANEOUS) ×1
SET ARTHROSCOPY TUBING LN (MISCELLANEOUS) ×1 IMPLANT
SET PAD KNEE POSITIONER (MISCELLANEOUS) ×2 IMPLANT
SPONGE GAUZE 4X4 12PLY (GAUZE/BANDAGES/DRESSINGS) ×4 IMPLANT
SPONGE GAUZE 4X4 12PLY STER LF (GAUZE/BANDAGES/DRESSINGS) ×2 IMPLANT
SPONGE LAP 4X18 X RAY DECT (DISPOSABLE) ×2 IMPLANT
STRIP CLOSURE SKIN 1/2X4 (GAUZE/BANDAGES/DRESSINGS) ×2 IMPLANT
SUCTION FRAZIER HANDLE 10FR (MISCELLANEOUS) ×1
SUCTION TUBE FRAZIER 10FR DISP (MISCELLANEOUS) ×1 IMPLANT
SUT 2 FIBERLOOP 20 STRT BLUE (SUTURE) ×4
SUT ETHILON 4 0 PS 2 18 (SUTURE) ×4 IMPLANT
SUT FIBERWIRE #2 38 T-5 BLUE (SUTURE) ×2
SUT FIBERWIRE 2-0 18 17.9 3/8 (SUTURE) ×2
SUT MNCRL AB 3-0 PS2 18 (SUTURE) IMPLANT
SUT VIC AB 0 CT1 36 (SUTURE) IMPLANT
SUT VIC AB 0 CT2 27 (SUTURE) ×8 IMPLANT
SUT VIC AB 2-0 CT1 27 (SUTURE) ×1
SUT VIC AB 2-0 CT1 TAPERPNT 27 (SUTURE) ×1 IMPLANT
SUTURE 2 FIBERLOOP 20 STRT BLU (SUTURE) ×2 IMPLANT
SUTURE FIBERWR #2 38 T-5 BLUE (SUTURE) ×1 IMPLANT
SUTURE FIBERWR 2-0 18 17.9 3/8 (SUTURE) ×1 IMPLANT
SUTURE TIGERSTICK 2 TIGERWIR 2 (MISCELLANEOUS) ×1 IMPLANT
SYR CONTROL 10ML LL (SYRINGE) IMPLANT
TIGERSTICK 2 TIGERWIRE 2 (MISCELLANEOUS) ×2
TOWEL OR 17X24 6PK STRL BLUE (TOWEL DISPOSABLE) ×2 IMPLANT
TUBE CONNECTING 12X1/4 (SUCTIONS) ×6 IMPLANT
WAND 30 DEG SABER W/CORD (SURGICAL WAND) IMPLANT
WAND 90 DEG TURBOVAC W/CORD (SURGICAL WAND) IMPLANT
WATER STERILE IRR 500ML POUR (IV SOLUTION) ×2 IMPLANT

## 2015-03-22 NOTE — Anesthesia Preprocedure Evaluation (Signed)
Anesthesia Evaluation  Patient identified by MRN, date of birth, ID band Patient awake    Reviewed: Allergy & Precautions, NPO status , Patient's Chart, lab work & pertinent test results, reviewed documented beta blocker date and time   Airway Mallampati: I  TM Distance: >3 FB Neck ROM: Full    Dental  (+) Teeth Intact   Pulmonary neg pulmonary ROS,    breath sounds clear to auscultation       Cardiovascular hypertension, Pt. on medications and Pt. on home beta blockers  Rhythm:Regular Rate:Normal     Neuro/Psych  Headaches, negative psych ROS   GI/Hepatic negative GI ROS, Neg liver ROS,   Endo/Other  negative endocrine ROS  Renal/GU negative Renal ROS  negative genitourinary   Musculoskeletal  (+) Arthritis ,   Abdominal   Peds negative pediatric ROS (+)  Hematology negative hematology ROS (+)   Anesthesia Other Findings   Reproductive/Obstetrics negative OB ROS                             Lab Results  Component Value Date   WBC 11.5* 10/12/2014   HGB 12.7 10/12/2014   HCT 39.1 10/12/2014   MCV 85.9 10/12/2014   PLT 334 10/12/2014   Lab Results  Component Value Date   CREATININE 0.51 11/27/2010   BUN 7 11/27/2010   NA 140 11/27/2010   K 4.5 11/27/2010   CL 103 11/27/2010   CO2 26 11/27/2010   No results found for: INR, PROTIME  03/2015: EKG: normal sinus rhythm.   Anesthesia Physical Anesthesia Plan  ASA: III  Anesthesia Plan: General   Post-op Pain Management:    Induction: Intravenous  Airway Management Planned: LMA  Additional Equipment:   Intra-op Plan:   Post-operative Plan: Extubation in OR  Informed Consent: I have reviewed the patients History and Physical, chart, labs and discussed the procedure including the risks, benefits and alternatives for the proposed anesthesia with the patient or authorized representative who has indicated his/her  understanding and acceptance.   Dental advisory given  Plan Discussed with: CRNA  Anesthesia Plan Comments:         Anesthesia Quick Evaluation

## 2015-03-22 NOTE — H&P (Signed)
Kathy White is an 35 y.o. female.   Chief Complaint: Left knee pain and instability  HPI: Patient presents with joint discomfort that had been persistent for several weeks now. Related to a twisting injury in August 2016. Despite conservative treatments, her discomfort has not improved. Imaging was obtained. Other conservative and surgical treatments were discussed in detail. Patient wishes to proceed with surgery as consented. Denies SOB, CP, or calf pain. No Fever, chills, or nausea/ vomiting.   Past Medical History  Diagnosis Date  . FH: breast cancer in first degree relative 02/26/2013  . Hypertension   . Left ACL tear   . Acute meniscal tear of left knee   . OA (osteoarthritis) of knee   . History of HPV infection   . History of abnormal cervical Pap smear   . Wears glasses   . Migraine     Past Surgical History  Procedure Laterality Date  . Cesarean section  11-14-2004  . Cervical biopsy  w/ loop electrode excision    . Nerve, tendon and artery repair Right 10/12/2014    Procedure: RIGHT RING FINGER AND RIGHT SMALL FINGER WOUND EXPLORATION WITH TENDON REPAIRS;  Surgeon: Iran Planas, MD;  Location: [facility];  Service: Orthopedics;  Laterality: Right;  . Laparoscopic cholecystectomy  12-20-2004  . Hand wound exploration and tendon repair's Right 10-12-2014    right ring and index fingers    Family History  Problem Relation Age of Onset  . Cancer Mother 53    breast  . Hypertension Mother   . Cancer Sister 90    breast  . Hypertension Sister   . Diabetes Sister   . Cancer Cousin 29    breast  . Thyroid disease Brother   . Diabetes Sister   . Hypertension Sister   . Neuropathy Sister   . Obesity Sister   . Hypertension Sister   . Thyroid disease Sister   . Cancer Sister     endometrial  . Diabetes Paternal Grandfather   . Cancer Maternal Grandmother   . Cancer Maternal Grandfather   . Diabetes Father   . Hypertension Father   . Other Father     open heart  surgery   Social History:  reports that she has never smoked. She has never used smokeless tobacco. She reports that she drinks alcohol. She reports that she does not use illicit drugs.  Allergies:  Allergies  Allergen Reactions  . Percocet [Oxycodone-Acetaminophen] Nausea And Vomiting    Medications Prior to Admission  Medication Sig Dispense Refill  . CRANBERRY PO Take 1,400 mg by mouth daily.    . hydrochlorothiazide (MICROZIDE) 12.5 MG capsule Take 1 capsule (12.5 mg total) by mouth daily. (Patient taking differently: Take 12.5 mg by mouth every morning. ) 30 capsule 11  . ibuprofen (ADVIL,MOTRIN) 200 MG tablet Take 600 mg by mouth daily as needed for moderate pain.    Marland Kitchen propranolol ER (INDERAL LA) 120 MG 24 hr capsule Take 1 capsule (120 mg total) by mouth daily. (Patient taking differently: Take 120 mg by mouth every morning. ) 30 capsule 0    No results found for this or any previous visit (from the past 48 hour(s)). No results found.  Review of Systems  Constitutional: Negative.   HENT: Negative.   Eyes: Negative.   Respiratory: Negative.   Cardiovascular: Negative.   Gastrointestinal: Negative.   Genitourinary: Negative.   Musculoskeletal: Positive for joint pain.       Left knee instability.  Skin: Negative.   Neurological: Negative.   Endo/Heme/Allergies: Negative.   Psychiatric/Behavioral: Negative.     Blood pressure 144/91, pulse 60, temperature 98.2 F (36.8 C), temperature source Oral, resp. rate 16, height 5' 2.5" (1.588 m), weight 112.492 kg (248 lb), SpO2 99 %. Physical Exam  Constitutional: She is oriented to person, place, and time. She appears well-developed.  HENT:  Head: Normocephalic.  Eyes: EOM are normal.  Neck: Normal range of motion.  Cardiovascular: Normal rate and intact distal pulses.   Respiratory: Effort normal.  GI: Soft.  Genitourinary:  Deferred  Musculoskeletal:  Left knee instability. LLE Grossly N/V intact.   Neurological:  She is alert and oriented to person, place, and time.  Skin: Skin is warm and dry.  Psychiatric: Her behavior is normal.     Assessment/Plan Left knee ACL Tear, MMT, OA Left knee scope and ACL autograft reconstruction D/c home today Follow up in the office Follow instructions  Olivette Beckmann L 03/22/2015, 12:54 PM

## 2015-03-22 NOTE — Transfer of Care (Signed)
Immediate Anesthesia Transfer of Care Note  Patient: Kathy White  Procedure(s) Performed: Procedure(s) with comments: LEFT KNEE ARTHROSCOPY WITH ANTERIOR CRUCIATE LIGAMENT (ACL) AUTOGRAFT RECONSTRUCTION (Left) - ANESTHESIA: GENERAL/ABDUCTOR CANAL BLOCK LEFT KNEE EXAM UNDER ANESTHESIA   (Left) PARTIAL LEFT KNEE  LATERAL MENISECTOMY, CHONDROPLASTY (Left)  Patient Location: PACU  Anesthesia Type:General  Level of Consciousness: oriented, sedated and responds to stimulation  Airway & Oxygen Therapy: Patient Spontanous Breathing and Patient connected to face mask oxygen  Post-op Assessment: Report given to RN  Post vital signs: Reviewed and stable  Last Vitals:  Filed Vitals:   03/22/15 1250 03/22/15 1300  BP: 134/102 125/70  Pulse: 67 62  Temp:    Resp: 15 17    Complications: No apparent anesthesia complications

## 2015-03-22 NOTE — Anesthesia Procedure Notes (Addendum)
Anesthesia Regional Block:  Femoral nerve block  Pre-Anesthetic Checklist: ,, timeout performed, Correct Patient, Correct Site, Correct Laterality, Correct Procedure, Correct Position, site marked, Risks and benefits discussed,  Surgical consent,  Pre-op evaluation,  At surgeon's request and post-op pain management  Laterality: Left  Prep: chloraprep       Needles:  Injection technique: Single-shot  Needle Type: Echogenic Needle     Needle Length: 9cm 9 cm Needle Gauge: 21 and 21 G    Additional Needles:  Procedures: ultrasound guided (picture in chart) Femoral nerve block Narrative:  Start time: 03/22/2015 1:00 PM End time: 03/22/2015 1:03 PM Injection made incrementally with aspirations every 5 mL.  Performed by: Personally  Anesthesiologist: Suella Broad D  Additional Notes: No immediate complications noted.    Procedure Name: LMA Insertion Date/Time: 03/22/2015 1:38 PM Performed by: Bethena Roys T Pre-anesthesia Checklist: Patient identified, Emergency Drugs available, Suction available and Patient being monitored Patient Re-evaluated:Patient Re-evaluated prior to inductionOxygen Delivery Method: Circle System Utilized Preoxygenation: Pre-oxygenation with 100% oxygen Intubation Type: IV induction Ventilation: Mask ventilation without difficulty LMA: LMA with gastric port inserted LMA Size: 4.0 Number of attempts: 1 Placement Confirmation: positive ETCO2 Tube secured with: Tape Dental Injury: Teeth and Oropharynx as per pre-operative assessment

## 2015-03-22 NOTE — Interval H&P Note (Signed)
History and Physical Interval Note:  03/22/2015 1:26 PM  Kathy White  has presented today for surgery, with the diagnosis of LEFT KNEE ACL TEAR, MEDIAL MENISCUS TEAR, OA  The various methods of treatment have been discussed with the patient and family. After consideration of risks, benefits and other options for treatment, the patient has consented to  Procedure(s) with comments: LEFT KNEE ARTHROSCOPY WITH ANTERIOR CRUCIATE LIGAMENT (ACL) AUTOGRAFT RECONSTRUCTION (Left) - ANESTHESIA: GENERAL/ABDUCTOR CANAL BLOCK LEFT KNEE EXAM UNDER ANESTHESIA   (Left) PARTIAL LEFT KNEE MENISECTOMY, CHONDROPLASTY (Left) as a surgical intervention .  The patient's history has been reviewed, patient examined, no change in status, stable for surgery.  I have reviewed the patient's chart and labs.  Questions were answered to the patient's satisfaction.     [name] ANDREW

## 2015-03-22 NOTE — Discharge Instructions (Signed)
°  Post Anesthesia Home Care Instructions ° °Activity: °Get plenty of rest for the remainder of the day. A responsible adult should stay with you for 24 hours following the procedure.  °For the next 24 hours, DO NOT: °-Drive a car °-Operate machinery °-Drink alcoholic beverages °-Take any medication unless instructed by your physician °-Make any legal decisions or sign important papers. ° °Meals: °Start with liquid foods such as gelatin or soup. Progress to regular foods as tolerated. Avoid greasy, spicy, heavy foods. If nausea and/or vomiting occur, drink only clear liquids until the nausea and/or vomiting subsides. Call your physician if vomiting continues. ° °Special Instructions/Symptoms: °Your throat may feel dry or sore from the anesthesia or the breathing tube placed in your throat during surgery. If this causes discomfort, gargle with warm salt water. The discomfort should disappear within 24 hours. ° °If you had a scopolamine patch placed behind your ear for the management of post- operative nausea and/or vomiting: ° °1. The medication in the patch is effective for 72 hours, after which it should be removed.  Wrap patch in a tissue and discard in the trash. Wash hands thoroughly with soap and water. °2. You may remove the patch earlier than 72 hours if you experience unpleasant side effects which may include dry mouth, dizziness or visual disturbances. °3. Avoid touching the patch. Wash your hands with soap and water after contact with the patch. °  °Regional Anesthesia Blocks ° °1. Numbness or the inability to move the "blocked" extremity may last from 3-48 hours after placement. The length of time depends on the medication injected and your individual response to the medication. If the numbness is not going away after 48 hours, call your surgeon. ° °2. The extremity that is blocked will need to be protected until the numbness is gone and the  Strength has returned. Because you cannot feel it, you will need  to take extra care to avoid injury. Because it may be weak, you may have difficulty moving it or using it. You may not know what position it is in without looking at it while the block is in effect. ° °3. For blocks in the legs and feet, returning to weight bearing and walking needs to be done carefully. You will need to wait until the numbness is entirely gone and the strength has returned. You should be able to move your leg and foot normally before you try and bear weight or walk. You will need someone to be with you when you first try to ensure you do not fall and possibly risk injury. ° °4. Bruising and tenderness at the needle site are common side effects and will resolve in a few days. ° °5. Persistent numbness or new problems with movement should be communicated to the surgeon or the [facility] ([phone])/ [facility] ([phone]). °

## 2015-03-22 NOTE — Anesthesia Postprocedure Evaluation (Signed)
Anesthesia Post Note  Patient: Kathy White  Procedure(s) Performed: Procedure(s) (LRB): LEFT KNEE ARTHROSCOPY WITH ANTERIOR CRUCIATE LIGAMENT (ACL) AUTOGRAFT RECONSTRUCTION (Left) LEFT KNEE EXAM UNDER ANESTHESIA   (Left) PARTIAL LEFT KNEE  LATERAL MENISECTOMY, CHONDROPLASTY (Left)  Patient location during evaluation: PACU Anesthesia Type: General and Spinal Level of consciousness: awake, sedated and patient cooperative Pain management: pain level controlled Vital Signs Assessment: post-procedure vital signs reviewed and stable Respiratory status: respiratory function stable and spontaneous breathing Cardiovascular status: blood pressure returned to baseline Anesthetic complications: no    Last Vitals:  Filed Vitals:   03/22/15 1300 03/22/15 1601  BP: 125/70 144/86  Pulse: 62 67  Temp:  36.8 C  Resp: 17 18    Last Pain:  Filed Vitals:   03/22/15 1612  PainSc: Asleep                 Hipolito Martinezlopez EDWARD

## 2015-03-22 NOTE — Interval H&P Note (Signed)
History and Physical Interval Note:  03/22/2015 1:26 PM  Kathy White  has presented today for surgery, with the diagnosis of LEFT KNEE ACL TEAR, MEDIAL MENISCUS TEAR, OA  The various methods of treatment have been discussed with the patient and family. After consideration of risks, benefits and other options for treatment, the patient has consented to  Procedure(s) with comments: LEFT KNEE ARTHROSCOPY WITH ANTERIOR CRUCIATE LIGAMENT (ACL) AUTOGRAFT RECONSTRUCTION (Left) - ANESTHESIA: GENERAL/ABDUCTOR CANAL BLOCK LEFT KNEE EXAM UNDER ANESTHESIA   (Left) PARTIAL LEFT KNEE MENISECTOMY, CHONDROPLASTY (Left) as a surgical intervention .  The patient's history has been reviewed, patient examined, no change in status, stable for surgery.  I have reviewed the patient's chart and labs.  Questions were answered to the patient's satisfaction.     [name]

## 2015-03-22 NOTE — Op Note (Signed)
[id number]

## 2015-03-23 ENCOUNTER — Other Ambulatory Visit: Payer: Medicaid Other | Admitting: Adult Health

## 2015-03-23 ENCOUNTER — Encounter (HOSPITAL_BASED_OUTPATIENT_CLINIC_OR_DEPARTMENT_OTHER): Payer: Self-pay | Admitting: Specialist

## 2015-03-23 LAB — POCT I-STAT 4, (NA,K, GLUC, HGB,HCT)
GLUCOSE: 91 mg/dL (ref 65–99)
HCT: 41 % (ref 36.0–46.0)
Hemoglobin: 13.9 g/dL (ref 12.0–15.0)
POTASSIUM: 3.8 mmol/L (ref 3.5–5.1)
Sodium: 141 mmol/L (ref 135–145)

## 2015-03-23 NOTE — Op Note (Signed)
Kathy White, TE NO.:  [id number]  MEDICAL RECORD NO.:  [id number]  LOCATION:                               FACILITY:  [facility]  PHYSICIAN:  Cynda Familia, M.D.DATE OF BIRTH:  20-Jul-1980  DATE OF PROCEDURE:  03/22/2015 DATE OF DISCHARGE:  03/22/2015                              OPERATIVE REPORT   PREOPERATIVE DIAGNOSES:  Left knee torn anterior cruciate ligament, possible torn meniscus, early osteoarthritis.  POSTOPERATIVE DIAGNOSES: 1. Left knee complete rupture of anterior cruciate ligament. 2. Unstable tear of posterior horn lateral meniscus. 3. 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  PROCEDURE: 1. Left knee arthroscopic-assisted autograft, hamstring anterior     cruciate ligament reconstruction. 2. Lateral meniscal repair. 3. Chondroplasty of patella, medial femoral condyle.  SURGEON:  Cynda Familia, M.D.  ASSISTANT:  Wyatt Portela, PA-C.  ANESTHESIA:  Adductor canal block with general.  ESTIMATED BLOOD LOSS:  Minimal.  DRAINS:  None.  COMPLICATIONS:  None.  TOURNIQUET TIME:  One hour and 50 minutes at 250 mmHg.  DISPOSITION:  PACU stable.  OPERATIVE DETAILS:  The patient was counseled in the holding area. Correct site was identified and marked and signed appropriately.  IV started.  Sedation given.  Block was administered.  Taken to the operating room, placed in supine position under general anesthesia.  IV antibiotics were given.  Left lower extremity was elevated.  It was prepped with DuraPrep and draped in a sterile fashion.  Time-out was done, confirmed the left side, exsanguinated with an Esmarch and tourniquet inflated to 250 mmHg.  Incision was made over the pes tendons to the skin and subcutaneous tissue.  Small veins were electrocoagulated.  The sartorius fascia was opened, and the semitendinosus and gracilis were identified.  The semitendinosus and gracilis  were separated.  Initially attempted to harvest the semitendinosus, however, with the tourniquet being where it was, it was somewhat challenging.  Therefore, the tourniquet was let down.  The semitendinosus was harvested in standard fashion.  No complications or problems, and taken to the back table and prepared per Mr. Wyatt Portela, PA-C for an Arthrex ACL TightRope on both sides.  I then wrapped her out again and reinflated the tourniquet.  Closed the sartorius fascia with Vicryl.  Arthroscopic portals were established; proximal medial, inferomedial, and inferolateral.  Diagnostic arthroscopy of patellofemoral joint revealed grade 3 chondromalacia of lateral patellar facet, no instability, femoral trochlea unremarkable. Mechanical chondroplasty performed with shaver.  Suprapatellar pouch, medial and lateral gutters unremarkable.  Intercondylar notch __________ complete rupture of the anterior cruciate ligament __________.  ACL fibers debrided.  PCL was protected.  Notchplasty performed.  Overtop position was confirmed.  Lateral side was inspected.  Articular cartilage was healthy, but the lateral meniscus showed an unstable tear of the posterior horn lateral meniscus in the red-white zone, just medial to the popliteal hiatus.  This __________ somewhat of a bucket- handle displacement.  It was reduced properly and securely fixed with 2 Arthrex Meniscal Cinch devices.  This gave an excellent repair of the lateral meniscus.  Medial side was inspected.  Articular cartilage showed grade 3  changes in medial femoral condyle.  Chondroplasty with mechanical shaver. Medial meniscus was intact, had a very small partial tear, but completely stable, did not require surgical stabilization, this was in the red zone.  The femoral guide was sewn to the overtop position. Small lateral incision made to skin and subcutaneous tissue, IT band down the lateral femoral cortex.  An Arthrex FlipCutter was  inserted from lateral to medial into the anatomic ACL footprint and retro socket was performed.  FiberWire suture passed in place.  The graft was now delivered to the knee to appropriate depth into the tibial socket in 30 degrees of flexion.  Actually on the tibial side, we had made a socket with the tibial guide with anatomic ACL footprint.  A retro socket was performed.  FiberWire suture passed and placed.  Inferomedial portal site was utilized.  The grafts were delivered into the knee, for femoral side in a retrograde fashion to appropriate depth and for the tibial side in antegrade fashion.  At 30 degrees of flexion neutral rotation, both sides were well tensioned, fixed on both sides with the button on the tibia and PushLock on the tibia.  Knee was put through range of motion with excellent orientation and tension of the graft.  The knee was completely stable.  Arthroscopic equipments were removed.  Portals were closed with 4-0 nylon suture.  The incisions, inferolateral and inferomedial, were both closed with Vicryl.  Closed the IT band distal and lateral with Vicryl.  Subcu with Vicryl.  Skin with a subcuticular Vicryl suture.  Anterior with subcu Vicryl.  Skin with a subcuticular Monocryl suture.  A 20 mL of 0.25% Sensorcaine was placed at the end of the case to assist in knee block.  Sterile dressing applied, and the TED hose and ice pack.  The patient tolerated the procedure well.  There were no complications.  She was transferred to the operating room to PACU in stable condition.  She will be stabilized in PACU and discharged to home.  To help with the patient positioning, prepping and draping, technical and surgical assistance throughout the entire case, graft preparation, wound closure, application of dressing and splint, Mr. Wyatt Portela, PA-C's assistance was needed throughout the entire case.          ______________________________ Cynda Familia,  M.D.     RAC/MEDQ  D:  03/22/2015  T:  03/23/2015  Job:  [id number]

## 2015-03-23 NOTE — Op Note (Deleted)
NAME:  Kathy White, Kathy White NO.:  [id number]  MEDICAL RECORD NO.:  [id number]  LOCATION:  [facility]                         FACILITY:  [facility]  PHYSICIAN:  Cynda Familia, M.D.DATE OF BIRTH:  1980-08-15  DATE OF PROCEDURE:  03/22/2015 DATE OF DISCHARGE:  12/23/2014                              OPERATIVE REPORT   PREOPERATIVE DIAGNOSES:  Left knee torn anterior cruciate ligament, possible torn meniscus, early osteoarthritis.  POSTOPERATIVE DIAGNOSES: 1. Left knee complete rupture of anterior cruciate ligament. 2. Unstable tear of posterior horn lateral meniscus. 3. 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  PROCEDURE: 1. Left knee arthroscopic-assisted autograft, hamstring anterior     cruciate ligament reconstruction. 2. Lateral meniscal repair. 3. Chondroplasty of patella, medial femoral condyle.  SURGEON:  Cynda Familia, M.D.  ASSISTANT:  Wyatt Portela, PA-C.  ANESTHESIA:  Adductor canal block with general.  ESTIMATED BLOOD LOSS:  Minimal.  DRAINS:  None.  COMPLICATIONS:  None.  TOURNIQUET TIME:  One hour and 50 minutes at 250 mmHg.  DISPOSITION:  PACU stable.  OPERATIVE DETAILS:  The patient was counseled in the holding area. Correct site was identified and marked and signed appropriately.  IV started.  Sedation given.  Block was administered.  Taken to the operating room, placed in supine position under general anesthesia.  IV antibiotics were given.  Left lower extremity was elevated.  It was prepped with DuraPrep and draped in a sterile fashion.  Time-out was done, confirmed the left side, exsanguinated with an Esmarch and tourniquet inflated to 250 mmHg.  Incision was made over the pes tendons to the skin and subcutaneous tissue.  Small veins were electrocoagulated.  The sartorius fascia was opened, and the semitendinosus and gracilis were identified.  The semitendinosus and gracilis  were separated.  Initially attempted to harvest the semitendinosus, however, with the tourniquet being where it was, it was somewhat challenging.  Therefore, the tourniquet was let down.  The semitendinosus was harvested in standard fashion.  No complications or problems, and taken to the back table and prepared per Mr. Wyatt Portela, PA-C for an Arthrex ACL TightRope on both sides.  I then wrapped her out again and reinflated the tourniquet.  Closed the sartorius fascia with Vicryl.  Arthroscopic portals were established; proximal medial, inferomedial, and inferolateral.  Diagnostic arthroscopy of patellofemoral joint revealed grade 3 chondromalacia of lateral patellar facet, no instability, femoral trochlea unremarkable. Mechanical chondroplasty performed with shaver.  Suprapatellar pouch, medial and lateral gutters unremarkable.  Intercondylar notch __________ complete rupture of the anterior cruciate ligament __________.  ACL fibers debrided.  PCL was protected.  Notchplasty performed.  Overtop position was confirmed.  Lateral side was inspected.  Articular cartilage was healthy, but the lateral meniscus showed an unstable tear of the posterior horn lateral meniscus in the red-white zone, just medial to the popliteal hiatus.  This __________ somewhat of a bucket- handle displacement.  It was reduced properly and securely fixed with 2 Arthrex Meniscal Cinch devices.  This gave an excellent repair of the lateral meniscus.  Medial side was inspected.  Articular cartilage showed grade 3 changes in medial femoral  condyle.  Chondroplasty with mechanical shaver. Medial meniscus was intact, had a very small partial tear, but completely stable, did not require surgical stabilization, this was in the red zone.  The femoral guide was sewn to the overtop position. Small lateral incision made to skin and subcutaneous tissue, IT band down the lateral femoral cortex.  An Arthrex FlipCutter was  inserted from lateral to medial into the anatomic ACL footprint and retro socket was performed.  FiberWire suture passed in place.  The graft was now delivered to the knee to appropriate depth into the tibial socket in 30 degrees of flexion.  Actually on the tibial side, we had made a socket with the tibial guide with anatomic ACL footprint.  A retro socket was performed.  FiberWire suture passed and placed.  Inferomedial portal site was utilized.  The grafts were delivered into the knee, for femoral side in a retrograde fashion to appropriate depth and for the tibial side in antegrade fashion.  At 30 degrees of flexion neutral rotation, both sides were well tensioned, fixed on both sides with the button on the tibia and PushLock on the tibia.  Knee was put through range of motion with excellent orientation and tension of the graft.  The knee was completely stable.  Arthroscopic equipments were removed.  Portals were closed with 4-0 nylon suture.  The incisions, inferolateral and inferomedial, were both closed with Vicryl.  Closed the IT band distal and lateral with Vicryl.  Subcu with Vicryl.  Skin with a subcuticular Vicryl suture.  Anterior with subcu Vicryl.  Skin with a subcuticular Monocryl suture.  A 20 mL of 0.25% Sensorcaine was placed at the end of the case to assist in knee block.  Sterile dressing applied, and the TED hose and ice pack.  The patient tolerated the procedure well.  There were no complications.  She was transferred to the operating room to PACU in stable condition.  She will be stabilized in PACU and discharged to home.  To help with the patient positioning, prepping and draping, technical and surgical assistance throughout the entire case, graft preparation, wound closure, application of dressing and splint, Mr. Wyatt Portela, PA-C's assistance was needed throughout the entire case.          ______________________________ Cynda Familia,  M.D.     RAC/MEDQ  D:  03/22/2015  T:  03/23/2015  Job:  [id number]

## 2015-03-25 ENCOUNTER — Ambulatory Visit: Payer: Medicaid Other | Attending: Specialist | Admitting: Physical Therapy

## 2015-03-25 DIAGNOSIS — M6289 Other specified disorders of muscle: Secondary | ICD-10-CM | POA: Diagnosis present

## 2015-03-25 DIAGNOSIS — R29898 Other symptoms and signs involving the musculoskeletal system: Secondary | ICD-10-CM

## 2015-03-25 DIAGNOSIS — M25641 Stiffness of right hand, not elsewhere classified: Secondary | ICD-10-CM | POA: Insufficient documentation

## 2015-03-25 DIAGNOSIS — M79641 Pain in right hand: Secondary | ICD-10-CM | POA: Insufficient documentation

## 2015-03-25 DIAGNOSIS — R269 Unspecified abnormalities of gait and mobility: Secondary | ICD-10-CM | POA: Diagnosis present

## 2015-03-25 DIAGNOSIS — M25562 Pain in left knee: Secondary | ICD-10-CM | POA: Insufficient documentation

## 2015-03-25 DIAGNOSIS — R600 Localized edema: Secondary | ICD-10-CM | POA: Diagnosis present

## 2015-03-25 DIAGNOSIS — M25662 Stiffness of left knee, not elsewhere classified: Secondary | ICD-10-CM

## 2015-03-25 NOTE — Patient Instructions (Signed)
   Jahi Roza PT, DPT, LAT, ATC  [facility] Phone: [phone]     

## 2015-03-25 NOTE — Therapy (Signed)
[place], [place], [address] Phone: [phone]   Fax:  [phone]  Physical Therapy Evaluation   Patient Details  Name: Kathy White MRN: [id number] Date of Birth: 08-07-1980 Referring Provider: Hart Robinsons MD  Encounter Date: 03/25/2015      PT End of Session - 03/25/15 1201    Visit Number 1   Number of Visits 4   Date for PT Re-Evaluation 05/20/15   Authorization Type Medicaid   PT Start Time [id number]   PT Stop Time 1100   PT Time Calculation (min) 45 min   Activity Tolerance Patient tolerated treatment well   Behavior During Therapy [facility] for tasks assessed/performed      Past Medical History  Diagnosis Date  . FH: breast cancer in first degree relative 02/26/2013  . Hypertension   . Left ACL tear   . Acute meniscal tear of left knee   . OA (osteoarthritis) of knee   . History of HPV infection   . History of abnormal cervical Pap smear   . Wears glasses   . Migraine     Past Surgical History  Procedure Laterality Date  . Cesarean section  11-14-2004  . Cervical biopsy  w/ loop electrode excision    . Nerve, tendon and artery repair Right 10/12/2014    Procedure: RIGHT RING FINGER AND RIGHT SMALL FINGER WOUND EXPLORATION WITH TENDON REPAIRS;  Surgeon: Iran Planas, MD;  Location: [facility];  Service: Orthopedics;  Laterality: Right;  . Laparoscopic cholecystectomy  12-20-2004  . Hand wound exploration and tendon repair's Right 10-12-2014    right ring and index fingers  . Knee arthroscopy with anterior cruciate ligament (acl) repair with hamstring graft Left 03/22/2015    Procedure: LEFT KNEE ARTHROSCOPY WITH ANTERIOR CRUCIATE LIGAMENT (ACL) AUTOGRAFT RECONSTRUCTION;  Surgeon: Sydnee Cabal, MD;  Location: [facility];  Service: Orthopedics;  Laterality: Left;  ANESTHESIA: GENERAL/ABDUCTOR CANAL BLOCK  . Exam under anesthesia with manipulation of knee Left 03/22/2015     Procedure: LEFT KNEE EXAM UNDER ANESTHESIA  ;  Surgeon: Sydnee Cabal, MD;  Location: [facility];  Service: Orthopedics;  Laterality: Left;  . Menisectomy Left 03/22/2015    Procedure: PARTIAL LEFT KNEE  LATERAL MENISECTOMY, CHONDROPLASTY;  Surgeon: Sydnee Cabal, MD;  Location: [facility];  Service: Orthopedics;  Laterality: Left;    There were no vitals filed for this visit.  Visit Diagnosis:  Left knee pain - Plan: PT plan of care cert/re-cert  Local edema - Plan: PT plan of care cert/re-cert  Decreased ROM of left knee - Plan: PT plan of care cert/re-cert  Left leg weakness - Plan: PT plan of care cert/re-cert  Abnormality of gait - Plan: PT plan of care cert/re-cert      Subjective Assessment - 03/25/15 1020    Subjective pt is a 35 y.o F s/p L ACL Reconstruction 03/22/2015. She reports having a hx or problems with the knee previous and stepped over and her knee popped and it gave way. Since the surgery she reports that it has been sore. pt arrived to todays appointment on crutches using her SLR .    Limitations Sitting;Standing;Walking;Lifting;House hold activities   How long can you sit comfortably? 30 min   How long can you stand comfortably? 20 min    How long can you walk comfortably? 15-20 min   Diagnostic tests x-ray 03/25/2015 pr pt report everything looked good.    Patient  Stated Goals I want my leg back, decrease pain, improve strength   Currently in Pain? Yes   Pain Score 5   last took pain meds at 5Am   Pain Location Knee   Pain Orientation Left;Anterior   Pain Descriptors / Indicators Aching   Pain Type Surgical pain   Pain Onset In the past 7 days   Pain Frequency Constant   Aggravating Factors  walking, standing, sitting, moving around,    Pain Relieving Factors medication, ice, elevation            [facility] PT Assessment - 03/25/15 1000    Assessment   Medical Diagnosis s/p L ACL reconstruction    Referring Provider  Hart Robinsons MD   Onset Date/Surgical Date 03/22/15   Hand Dominance Right   Next MD Visit 04/15/2015   Prior Therapy yes   Precautions   Precautions Knee   Precaution Comments ACL precaustions, brace, and crutches   Restrictions   Weight Bearing Restrictions Yes   Balance Screen   Has the patient fallen in the past 6 months Yes   How many times? 1   Has the patient had a decrease in activity level because of a fear of falling?  No   Is the patient reluctant to leave their home because of a fear of falling?  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;Full time employment  [employer] Requirements n/a   Cognition   Overall Cognitive Status Within Functional Limits for tasks assessed   Observation/Other Assessments   Focus on Therapeutic Outcomes (FOTO)  99% limited  predicted 60% limited   Observation/Other Assessments-Edema    Edema Circumferential   Circumferential Edema   Circumferential - Left  @ joint line 52, 10 cm above 53 , 10 cm below 46.5 cm   ROM / Strength   AROM / PROM / Strength AROM;PROM   AROM   AROM Assessment Site Knee   Right/Left Knee Right;Left   Right Knee Extension 0   Right Knee Flexion 128   Left Knee Extension -14   Left Knee Flexion 48   PROM   PROM Assessment Site Knee   Right/Left Knee Right;Left   Left Knee Extension -14   Left Knee Flexion 60   Palpation   Palpation comment tendnerness located peripatellar and around the incision sites   Ambulation/Gait   Assistive device Crutches   Gait  Pattern Step-to pattern;Decreased stride length;Antalgic;Decreased step length - right;Decreased stance time - left;Decreased weight shift to left                           PT Education - 03/25/15 1201    Education provided Yes   Education Details evaluation findings, POC, Goals, HEP   Person(s) Educated Patient   Methods Explanation   Comprehension Verbalized understanding          PT Short Term Goals - 03/25/15 1211    PT SHORT TERM GOAL #1   Title pt will  be able to verbalize and demonstrate technqiues to reduce L knee swelling and inflammation via RICE (05/20/2015)   Baseline no knowledge of how to control inflammation   Time 6   Period Weeks   Status New           PT Long Term Goals - 03/25/15 1211    PT LONG TERM GOAL #1   Title pt will be I with all HEP given as of last visit (05/20/2015)   Baseline no previous HEP   Time 6   Period Weeks   Status New   PT LONG TERM GOAL #2   Title pt will improve L knee AROM to >/=120 degrees of flexion and </= -5 degrees of extension to assist with functional and efficient gait pattern (05/20/2015)   Baseline L knee extension -14, L knee flexion 48   Time 6   Period Weeks   Status New   PT LONG TERM GOAL #3   Title pt will be able to walk/ standing for >/= 30 minutes with LRAD with </=2/10 to assist with walking/ standing endurance (05/20/2015)   Baseline able to stand 10-15 mintues with 6/10 pain with crutches and SLR brace.    Time 6   Period Weeks   Status New   PT LONG TERM GOAL #4   Title pt will be bale to perform SLR with no </= -5 degree quad lag to promote quad strengthening (05/20/2015)   Baseline strength not assessed due to precuautions/ protocol   Time 6   Period Weeks   Status New   PT LONG TERM GOAL #5   Title pt will improve her FOTO score by >/= 20 to demonstrate improvement of function (05/20/2015)   Baseline inital foto score 1   Time 6   Period Weeks   Status New                Plan - 03/25/15 1202    Clinical Impression Statement Salayah presents to OPPT S/P L ACL reconstruction on 03/22/2015. The incision site appears to be intact, clean and healing well. She demonstrates limited L knee AROM/PROM. Strength wasn't assessed secondary to precautions. She currently ambulates with crutches using a SLR. She currently is unable to elicit a quad contraction during quad sets. Educated on [facility] following Therapy. She would benefit from physical therapy to decrease pain and improve mobility and strength to return to PLOF by addressing the impairments listed.    based on exam findings pt presents with Low evaluation complexity. CPT [phone]   Pt will benefit from skilled therapeutic intervention in order to improve on the following deficits Pain;Postural dysfunction;Improper body mechanics;Decreased strength;Increased edema;Decreased mobility;Hypomobility;Impaired flexibility;Difficulty walking;Decreased activity tolerance;Decreased balance;Decreased endurance   Rehab Potential Good   PT Frequency 1x / week  every other week.    PT Duration 6 weeks   PT Treatment/Interventions ADLs/Self Care Home Management;Cryotherapy;Electrical Stimulation;Iontophoresis 4mg /ml Dexamethasone;Moist Heat;Therapeutic exercise;Manual techniques;Therapeutic activities;Vasopneumatic Device;Taping;Dry needling;Passive range of motion;Ultrasound;Stair training;Gait training;Patient/family education   PT Next Visit Plan assess response to HEP, quad activiation, strengthening, Vaso for swelling   PT Home Exercise Plan quad set, hamstring stretch, sidelying hip abduction, SLR (to begin 1 week from now weight shifts, SAQ, and heel slides)   Consulted and Agree with Plan of Care Patient         Problem List Patient Active Problem List   Diagnosis Date Noted  . S/P ACL reconstruction 03/22/2015  . Hematuria 04/21/2013  . Hypertension 03/31/2013  . IUD (  intrauterine device) in place 02/26/2013   . FH: breast cancer in first degree relative 02/26/2013  . Swelling of both ankles 08/01/2012  . Obesity 07/31/2012  . Migraine 07/31/2012   Starr Lake PT, DPT, LAT, ATC  03/25/2015  12:25 PM     [facility] [address] [place], [place], [address] Phone: [phone]   Fax:  [phone]  Name: Kathy White MRN: [id number] Date of Birth: 1980-12-20

## 2015-04-06 ENCOUNTER — Ambulatory Visit (INDEPENDENT_AMBULATORY_CARE_PROVIDER_SITE_OTHER): Payer: Medicaid Other | Admitting: Adult Health

## 2015-04-06 ENCOUNTER — Other Ambulatory Visit (HOSPITAL_COMMUNITY)
Admission: RE | Admit: 2015-04-06 | Discharge: 2015-04-06 | Disposition: A | Discharge status: Home / Self Care | Payer: Medicaid Other | Source: Ambulatory Visit | Attending: Adult Health | Admitting: Adult Health

## 2015-04-06 ENCOUNTER — Encounter: Payer: Self-pay | Admitting: Adult Health

## 2015-04-06 VITALS — BP 120/80 | HR 68 | Ht 63.0 in | Wt 248.0 lb

## 2015-04-06 DIAGNOSIS — Z1151 Encounter for screening for human papillomavirus (HPV): Secondary | ICD-10-CM | POA: Insufficient documentation

## 2015-04-06 DIAGNOSIS — Z01419 Encounter for gynecological examination (general) (routine) without abnormal findings: Secondary | ICD-10-CM

## 2015-04-06 DIAGNOSIS — Z975 Presence of (intrauterine) contraceptive device: Secondary | ICD-10-CM

## 2015-04-06 DIAGNOSIS — Z0000 Encounter for general adult medical examination without abnormal findings: Secondary | ICD-10-CM

## 2015-04-06 DIAGNOSIS — Z124 Encounter for screening for malignant neoplasm of cervix: Secondary | ICD-10-CM

## 2015-04-06 DIAGNOSIS — I10 Essential (primary) hypertension: Secondary | ICD-10-CM

## 2015-04-06 DIAGNOSIS — Z803 Family history of malignant neoplasm of breast: Secondary | ICD-10-CM

## 2015-04-06 NOTE — Patient Instructions (Signed)
Physical in 1 year, pap in 3 if normal Mammogram yearly  

## 2015-04-06 NOTE — Progress Notes (Addendum)
Patient ID: Kathy White, female   DOB: [birth year], 35 y.o.   MRN: [id number] History of Present Illness: Kathy White is a 35 year old white female, in for well woman gyn exam and pap.She ACL repair left knee last week by Dr Theda Sers and had surgery right hand last year, she cut a tendon.She has an IUD, that was inserted 01/02/11. PCP is Dr Billey Chang in [place].  Current Medications, Allergies, Past Medical History, Past Surgical History, Family History and Social History were reviewed in [employer] record.     Review of Systems: Patient denies any headaches, hearing loss, fatigue, blurred vision, shortness of breath, chest pain, abdominal pain, problems with bowel movements, urination, or intercourse. No mood swings. Left knee still hurts some, but is better.   Physical Exam:BP 120/80 mmHg  Pulse 68  Ht 5\' 3"  (1.6 m)  Wt 248 lb (112.492 kg)  BMI 43.94 kg/m2 General:  Well developed, well nourished, no acute distress Skin:  Warm and dry Neck:  Midline trachea, normal thyroid, good ROM, no lymphadenopathy Lungs; Clear to auscultation bilaterally Breast:  No dominant palpable mass, retraction, or nipple discharge Cardiovascular: Regular rate and rhythm Abdomen:  Soft, non tender, no hepatosplenomegaly Pelvic:  External genitalia is normal in appearance, no lesions.  The vagina is normal in appearance. Urethra has no lesions or masses. The cervix is bulbous, +IUD strings, pap with HPV and GC/CHL performed.  Uterus is felt to be normal size, shape, and contour.  No adnexal masses or tenderness noted.Bladder is non tender, no masses felt. Extremities/musculoskeletal:  No swelling or varicosities noted, no clubbing or cyanosis Psych:  No mood changes, alert and cooperative,seems happy   Impression:  Well woman gyn exam and pap Family history of breast cancer in first degree relative IUD in place Hypertension    Plan: Labs with PCP Follow up with PCP for BP  meds  Mammogram yearly Physical in 1 year, pap in 3 if normal Return in 9 months for IUD removal and reinsertion with Dr Glo Herring

## 2015-04-08 ENCOUNTER — Ambulatory Visit: Payer: Medicaid Other | Admitting: Physical Therapy

## 2015-04-08 DIAGNOSIS — M25662 Stiffness of left knee, not elsewhere classified: Secondary | ICD-10-CM

## 2015-04-08 DIAGNOSIS — M25562 Pain in left knee: Secondary | ICD-10-CM | POA: Diagnosis not present

## 2015-04-08 DIAGNOSIS — R600 Localized edema: Secondary | ICD-10-CM

## 2015-04-08 DIAGNOSIS — M25641 Stiffness of right hand, not elsewhere classified: Secondary | ICD-10-CM

## 2015-04-08 DIAGNOSIS — R29898 Other symptoms and signs involving the musculoskeletal system: Secondary | ICD-10-CM

## 2015-04-08 DIAGNOSIS — R269 Unspecified abnormalities of gait and mobility: Secondary | ICD-10-CM

## 2015-04-08 DIAGNOSIS — M79641 Pain in right hand: Secondary | ICD-10-CM

## 2015-04-08 LAB — CYTOLOGY - PAP

## 2015-04-08 NOTE — Therapy (Signed)
[place], [place], [address] Phone: [phone]   Fax:  [phone]  Physical Therapy Treatment  Patient Details  Name: Kathy White MRN: [id number] Date of Birth: 08-23-1980 Referring Provider: Hart Robinsons MD  Encounter Date: 04/08/2015      PT End of Session - 04/08/15 1020    Visit Number 2   Number of Visits 4   Date for PT Re-Evaluation 05/20/15   Authorization Type Medicaid   PT Start Time [id number]   PT Stop Time 1115   PT Time Calculation (min) 60 min      Past Medical History  Diagnosis Date  . FH: breast cancer in first degree relative 02/26/2013  . Hypertension   . Left ACL tear   . Acute meniscal tear of left knee   . OA (osteoarthritis) of knee   . History of HPV infection   . History of abnormal cervical Pap smear   . Wears glasses   . Migraine   . Vaginal Pap smear, abnormal   . Obesity     Past Surgical History  Procedure Laterality Date  . Cesarean section  11-14-2004  . Cervical biopsy  w/ loop electrode excision    . Nerve, tendon and artery repair Right 10/12/2014    Procedure: RIGHT RING FINGER AND RIGHT SMALL FINGER WOUND EXPLORATION WITH TENDON REPAIRS;  Surgeon: Iran Planas, MD;  Location: [place];  Service: Orthopedics;  Laterality: Right;  . Laparoscopic cholecystectomy  12-20-2004  . Hand wound exploration and tendon repair's Right 10-12-2014    right ring and index fingers  . Knee arthroscopy with anterior cruciate ligament (acl) repair with hamstring graft Left 03/22/2015    Procedure: LEFT KNEE ARTHROSCOPY WITH ANTERIOR CRUCIATE LIGAMENT (ACL) AUTOGRAFT RECONSTRUCTION;  Surgeon: Sydnee Cabal, MD;  Location: [place];  Service: Orthopedics;  Laterality: Left;  ANESTHESIA: GENERAL/ABDUCTOR CANAL BLOCK  . Exam under anesthesia with manipulation of knee Left 03/22/2015    Procedure: LEFT KNEE EXAM UNDER ANESTHESIA  ;  Surgeon: Sydnee Cabal, MD;   Location: [facility];  Service: Orthopedics;  Laterality: Left;  . Menisectomy Left 03/22/2015    Procedure: PARTIAL LEFT KNEE  LATERAL MENISECTOMY, CHONDROPLASTY;  Surgeon: Sydnee Cabal, MD;  Location: [place];  Service: Orthopedics;  Laterality: Left;    There were no vitals filed for this visit.  Visit Diagnosis:  Left knee pain  Local edema  Decreased ROM of left knee  Left leg weakness  Abnormality of gait  Stiffness of joint, hand, right  Weakness of right hand  Pain in joint, hand, right      Subjective Assessment - 04/08/15 1021    Subjective I am doing good with my exercises   Currently in Pain? No/denies            [facility] PT Assessment - 04/08/15 0001    AROM   Left Knee Extension -10   Left Knee Flexion 78                     [facility] Adult PT Treatment/Exercise - 04/08/15 0001    Knee/Hip Exercises: Stretches   Other Knee/Hip Stretches prone knee hang 30 sec x 3   Knee/Hip Exercises: Standing   Heel Raises 10 reps   Knee Flexion 10 reps   Forward Step Up 1 set;10 reps;Hand Hold: 2;Step Height: 4"   Functional Squat 5 reps   Functional Squat Limitations 2 sets  SLS 10 sec x 4 with UE support   Knee/Hip Exercises: Supine   Quad Sets Left;2 sets;10 reps   Quad Sets Limitations 5-10 sec hold   Heel Slides 2 sets;10 reps   Heel Slides Limitations 78 degrees   Modalities   Modalities Vasopneumatic   Vasopneumatic   Number Minutes Vasopneumatic  15 minutes   Vasopnuematic Location  Knee   Vasopneumatic Pressure Low   Vasopneumatic Temperature  32                PT Education - 04/08/15 1059    Education provided Yes   Education Details mini squats, heel raises, prone knee hang, 4 inch step up   Person(s) Educated Patient   Methods Explanation;Handout   Comprehension Verbalized understanding          PT Short Term Goals - 04/08/15 1036    PT SHORT TERM GOAL #1   Title pt will be able  to verbalize and demonstrate technqiues to reduce L knee swelling and inflammation via RICE (05/20/2015)   Time 6   Period Weeks   Status Achieved           PT Long Term Goals - 03/25/15 1211    PT LONG TERM GOAL #1   Title pt will be I with all HEP given as of last visit (05/20/2015)   Baseline no previous HEP   Time 6   Period Weeks   Status New   PT LONG TERM GOAL #2   Title pt will improve L knee AROM to >/=120 degrees of flexion and </= -5 degrees of extension to assist with functional and efficient gait pattern (05/20/2015)   Baseline L knee extension -14, L knee flexion 48   Time 6   Period Weeks   Status New   PT LONG TERM GOAL #3   Title pt will be able to walk/ standing for >/= 30 minutes with LRAD with </=2/10 to assist with walking/ standing endurance (05/20/2015)   Baseline able to stand 10-15 mintues with 6/10 pain with crutches and SLR brace.    Time 6   Period Weeks   Status New   PT LONG TERM GOAL #4   Title pt will be bale to perform SLR with no </= -5 degree quad lag to promote quad strengthening (05/20/2015)   Baseline strength not assessed due to precuautions/ protocol   Time 6   Period Weeks   Status New   PT LONG TERM GOAL #5   Title pt will improve her FOTO score by >/= 20 to demonstrate improvement of function (05/20/2015)   Baseline inital foto score 1   Time 6   Period Weeks   Status New               Plan - 04/08/15 1110    Clinical Impression Statement Trenell is 17 days post op. She is doing well with current HEP. Updated HEP to include, mini squats, heel raises, prone knee hang 4 inch step up. . Also recommended wall walks for assisted knee flexion. Vaso foe edema. No increased pain per patient.    PT Next Visit Plan review and progress HEP weekly        Problem List Patient Active Problem List   Diagnosis Date Noted  . S/P ACL reconstruction 03/22/2015  . Hematuria 04/21/2013  . Hypertension 03/31/2013  . IUD (intrauterine  device) in place 02/26/2013  . FH: breast cancer in first degree relative 02/26/2013  . Swelling  of both ankles 08/01/2012  . Obesity 07/31/2012  . Migraine 07/31/2012    Dorene Ar, PTA 04/08/2015, 11:19 AM  [facility] [address] [place], [place], [address] Phone: [phone]   Fax:  [phone]  Name: Sueellen L Grigg MRN: [id number] Date of Birth: 1980-10-07

## 2015-04-08 NOTE — Patient Instructions (Addendum)
Mini Squat: Double Leg    With feet shoulder width apart, reach forward for balance and do a mini squat. Keep knees in line with second toe. Knees do not go past toes. Repeat _10__ times per set. Rest __60_ seconds after set. Do __2_ sets per session.   Heel Raises    Stand with support. Tighten pelvic floor and hold. With knees straight, raise heels off ground. Hold __3-5_ seconds.  Repeat _10-20__ times. Do __2_ times a day.  Copyright  VHI. All rights reserved.   Step Up    Step up with left leg, following with right leg. Return. Repeat __10__ times. Do __2-3__ sessions per day. GOOD POSTURE  Knee Extension Mobilization: Hang (Prone)    With table supporting thighs, place __0__ pound weight on right ankle. Hold _30-60___sec Repeat __3__ times per set. Do __1-2__ sets per session. Do __2__ sessions per day.

## 2015-04-15 ENCOUNTER — Encounter: Payer: Medicaid Other | Admitting: Physical Therapy

## 2015-04-22 ENCOUNTER — Ambulatory Visit: Payer: Medicaid Other | Attending: Specialist | Admitting: Physical Therapy

## 2015-04-22 DIAGNOSIS — R29898 Other symptoms and signs involving the musculoskeletal system: Secondary | ICD-10-CM | POA: Diagnosis present

## 2015-04-22 DIAGNOSIS — R269 Unspecified abnormalities of gait and mobility: Secondary | ICD-10-CM

## 2015-04-22 DIAGNOSIS — R600 Localized edema: Secondary | ICD-10-CM | POA: Diagnosis present

## 2015-04-22 DIAGNOSIS — M25562 Pain in left knee: Secondary | ICD-10-CM | POA: Diagnosis present

## 2015-04-22 DIAGNOSIS — M25641 Stiffness of right hand, not elsewhere classified: Secondary | ICD-10-CM | POA: Insufficient documentation

## 2015-04-22 DIAGNOSIS — M25662 Stiffness of left knee, not elsewhere classified: Secondary | ICD-10-CM

## 2015-04-22 NOTE — Patient Instructions (Signed)
Hip Extension (Prone)   Lift left leg _6___ inches from floor, keeping knee locked. Repeat _10___ times per set. Do _2___ sets per session. Do __2__ sessions per day.  http://orth.exer.us/98   Copyright  VHI. All rights reserved.  Hip Adduction: Leg Lift (Eccentric) - Side-Lying   Lie on side with top leg bent, foot flat behind lower leg. Quickly lift lower leg. Slowly lower for 3-5 seconds. _10__ reps per set, _2__ sets per session, 2sessions, per day, __7_ days per week. Copyright  VHI. All rights reserved.  Abduction: Side Leg Lift (Eccentric) - Side-Lying   Lie on side. Lift top leg slightly higher than shoulder level. Keep top leg straight with body, toes pointing forward. Slowly lower for 3-5 seconds. _10__ reps per set, 2 sessions __2_ sets per day, __7_ days per week. Add ___ lbs when you achieve ___ repetitions.  Copyright  VHI. All rights reserved.  Strengthening: Straight Leg Raise (Phase 1)   Tighten muscles on front of right thigh, then lift leg ___12_ inches from surface, keeping knee locked.  Repeat _10___ times per set. Do _2___ sets per session. Do _2___ sessions per day.  http://orth.exer.us/614   Copyright  VHI. All rights reserved.

## 2015-04-22 NOTE — Therapy (Signed)
[place], [place], [address] Phone: [phone]   Fax:  [phone]  Physical Therapy Treatment  Patient Details  Name: Kathy White MRN: [id number] Date of Birth: [birth year] Referring Provider: Hart Robinsons MD  Encounter Date: 04/22/2015      PT End of Session - 04/22/15 1056    Visit Number 3   Number of Visits 4   Date for PT Re-Evaluation 05/20/15   Authorization Type Medicaid   PT Start Time 1016   PT Stop Time 1115   PT Time Calculation (min) 59 min      Past Medical History  Diagnosis Date  . FH: breast cancer in first degree relative 02/26/2013  . Hypertension   . Left ACL tear   . Acute meniscal tear of left knee   . OA (osteoarthritis) of knee   . History of HPV infection   . History of abnormal cervical Pap smear   . Wears glasses   . Migraine   . Vaginal Pap smear, abnormal   . Obesity     Past Surgical History  Procedure Laterality Date  . Cesarean section  11-14-2004  . Cervical biopsy  w/ loop electrode excision    . Nerve, tendon and artery repair Right 10/12/2014    Procedure: RIGHT RING FINGER AND RIGHT SMALL FINGER WOUND EXPLORATION WITH TENDON REPAIRS;  Surgeon: Iran Planas, MD;  Location: [place];  Service: Orthopedics;  Laterality: Right;  . Laparoscopic cholecystectomy  12-20-2004  . Hand wound exploration and tendon repair's Right 10-12-2014    right ring and index fingers  . Knee arthroscopy with anterior cruciate ligament (acl) repair with hamstring graft Left 03/22/2015    Procedure: LEFT KNEE ARTHROSCOPY WITH ANTERIOR CRUCIATE LIGAMENT (ACL) AUTOGRAFT RECONSTRUCTION;  Surgeon: Sydnee Cabal, MD;  Location: [place];  Service: Orthopedics;  Laterality: Left;  ANESTHESIA: GENERAL/ABDUCTOR CANAL BLOCK  . Exam under anesthesia with manipulation of knee Left 03/22/2015    Procedure: LEFT KNEE EXAM UNDER ANESTHESIA  ;  Surgeon: Sydnee Cabal, MD;   Location[facility];  Service: Orthopedics;  Laterality: Left;  . Menisectomy Left 03/22/2015    Procedure: PARTIAL LEFT KNEE  LATERAL MENISECTOMY, CHONDROPLASTY;  Surgeon: Sydnee Cabal, MD;  Location: [place];  Service: Orthopedics;  Laterality: Left;    There were no vitals filed for this visit.  Visit Diagnosis:  Decreased ROM of left knee  Left knee pain  Local edema  Left leg weakness  Abnormality of gait      Subjective Assessment - 04/22/15 1215    Subjective The MD thought I was doing good. He bent my knee to 100 degrees in the office. I strained my hamstring getting out of the car.    Currently in Pain? No/denies            [facility] PT Assessment - 04/22/15 1027    AROM   Left Knee Extension -10   Left Knee Flexion 90                     [facility] Adult PT Treatment/Exercise - 04/22/15 0001    Knee/Hip Exercises: Stretches   Active Hamstring Stretch 30 seconds;3 reps   Active Hamstring Stretch Limitations with sheet   Knee/Hip Exercises: Aerobic   Recumbent Bike partial to full revolutins then full revolutions for 5 minutes   Knee/Hip Exercises: Machines for Strengthening   Cybex Leg Press 2x10 1 plate bilateral  Knee/Hip Exercises: Supine   Heel Slides 2 sets;10 reps   Heel Slides Limitations 85   Other Supine Knee/Hip Exercises 4 way hip x10-15 each no brace                PT Education - 04/22/15 1056    Education provided Yes   Education Details 4 way hip SLR   Person(s) Educated Patient   Methods Explanation;Handout   Comprehension Verbalized understanding          PT Short Term Goals - 04/08/15 1036    PT SHORT TERM GOAL #1   Title pt will be able to verbalize and demonstrate technqiues to reduce L knee swelling and inflammation via RICE (05/20/2015)   Time 6   Period Weeks   Status Achieved           PT Long Term Goals - 03/25/15 1211    PT LONG TERM GOAL #1   Title pt will be I  with all HEP given as of last visit (05/20/2015)   Baseline no previous HEP   Time 6   Period Weeks   Status New   PT LONG TERM GOAL #2   Title pt will improve L knee AROM to >/=120 degrees of flexion and </= -5 degrees of extension to assist with functional and efficient gait pattern (05/20/2015)   Baseline L knee extension -14, L knee flexion 48   Time 6   Period Weeks   Status New   PT LONG TERM GOAL #3   Title pt will be able to walk/ standing for >/= 30 minutes with LRAD with </=2/10 to assist with walking/ standing endurance (05/20/2015)   Baseline able to stand 10-15 mintues with 6/10 pain with crutches and SLR brace.    Time 6   Period Weeks   Status New   PT LONG TERM GOAL #4   Title pt will be bale to perform SLR with no </= -5 degree quad lag to promote quad strengthening (05/20/2015)   Baseline strength not assessed due to precuautions/ protocol   Time 6   Period Weeks   Status New   PT LONG TERM GOAL #5   Title pt will improve her FOTO score by >/= 20 to demonstrate improvement of function (05/20/2015)   Baseline inital foto score 1   Time 6   Period Weeks   Status New               Plan - 04/22/15 1211    Clinical Impression Statement AROM improved. Able to initiate bike for ROM. Also issued 4 way hip SLR for HEP. She started leg press 0-60 without increased pain. She demonstrates a mild qud lag. Worked on quad set and [employer] and issued for [employer].    PT Next Visit Plan review and progress HEP weekly        Problem List Patient Active Problem List   Diagnosis Date Noted  . S/P ACL reconstruction 03/22/2015  . Hematuria 04/21/2013  . Hypertension 03/31/2013  . IUD (intrauterine device) in place 02/26/2013  . FH: breast cancer in first degree relative 02/26/2013  . Swelling of both ankles 08/01/2012  . Obesity 07/31/2012  . Migraine 07/31/2012    Dorene Ar, PTA 04/22/2015, 12:17 PM  [facility] [address] [place], [place], [address] Phone: [phone]   Fax:  [phone]  Name: Kathy White MRN: [id number] Date of Birth: [birth year]

## 2015-04-29 ENCOUNTER — Ambulatory Visit: Payer: Medicaid Other | Admitting: Physical Therapy

## 2015-04-29 DIAGNOSIS — M25662 Stiffness of left knee, not elsewhere classified: Secondary | ICD-10-CM

## 2015-04-29 DIAGNOSIS — R600 Localized edema: Secondary | ICD-10-CM

## 2015-04-29 DIAGNOSIS — M25562 Pain in left knee: Secondary | ICD-10-CM

## 2015-04-29 DIAGNOSIS — M25641 Stiffness of right hand, not elsewhere classified: Secondary | ICD-10-CM

## 2015-04-29 DIAGNOSIS — R269 Unspecified abnormalities of gait and mobility: Secondary | ICD-10-CM

## 2015-04-29 DIAGNOSIS — R29898 Other symptoms and signs involving the musculoskeletal system: Secondary | ICD-10-CM | POA: Diagnosis not present

## 2015-04-29 NOTE — Therapy (Addendum)
[place], [place], [address] Phone: [phone]   Fax:  [phone]  Physical Therapy Treatment / discharge note  Patient Details  Name: Kathy White MRN: [id number] Date of Birth: May 25, 1980 Referring Provider: Hart Robinsons MD  Encounter Date: 04/29/2015      PT End of Session - 04/29/15 1222    Visit Number 4   Number of Visits 4   Date for PT Re-Evaluation 05/20/15   Authorization Type Medicaid   PT Start Time 0930   PT Stop Time 1030   PT Time Calculation (min) 60 min      Past Medical History  Diagnosis Date  . FH: breast cancer in first degree relative 02/26/2013  . Hypertension   . Left ACL tear   . Acute meniscal tear of left knee   . OA (osteoarthritis) of knee   . History of HPV infection   . History of abnormal cervical Pap smear   . Wears glasses   . Migraine   . Vaginal Pap smear, abnormal   . Obesity     Past Surgical History  Procedure Laterality Date  . Cesarean section  11-14-2004  . Cervical biopsy  w/ loop electrode excision    . Nerve, tendon and artery repair Right 10/12/2014    Procedure: RIGHT RING FINGER AND RIGHT SMALL FINGER WOUND EXPLORATION WITH TENDON REPAIRS;  Surgeon: Iran Planas, MD;  Location: [place];  Service: Orthopedics;  Laterality: Right;  . Laparoscopic cholecystectomy  12-20-2004  . Hand wound exploration and tendon repair's Right 10-12-2014    right ring and index fingers  . Knee arthroscopy with anterior cruciate ligament (acl) repair with hamstring graft Left 03/22/2015    Procedure: LEFT KNEE ARTHROSCOPY WITH ANTERIOR CRUCIATE LIGAMENT (ACL) AUTOGRAFT RECONSTRUCTION;  Surgeon: Sydnee Cabal, MD;  Location: [place];  Service: Orthopedics;  Laterality: Left;  ANESTHESIA: GENERAL/ABDUCTOR CANAL BLOCK  . Exam under anesthesia with manipulation of knee Left 03/22/2015    Procedure: LEFT KNEE EXAM UNDER ANESTHESIA  ;  Surgeon:  Sydnee Cabal, MD;  [facility]  Service: Orthopedics;  Laterality: Left;  . Menisectomy Left 03/22/2015    Procedure: PARTIAL LEFT KNEE  LATERAL MENISECTOMY, CHONDROPLASTY;  Surgeon: Sydnee Cabal, MD;  Location: [place];  Service: Orthopedics;  Laterality: Left;    There were no vitals filed for this visit.  Visit Diagnosis:  Left knee pain  Local edema  Decreased ROM of left knee  Left leg weakness  Abnormality of gait  Stiffness of joint, hand, right      Subjective Assessment - 04/29/15 0949    Subjective It doesnt really bother me, only when I try to bend it   Currently in Pain? No/denies   Aggravating Factors  trying to bend it            [facility] PT Assessment - 04/29/15 0001    AROM   Left Knee Flexion 105  on bike                     [facility] Adult PT Treatment/Exercise - 04/29/15 0001    Knee/Hip Exercises: Stretches   Hip Flexor Stretch 3 reps;30 seconds   Hip Flexor Stretch Limitations with quad stretch pull with strap   Knee/Hip Exercises: Aerobic   Recumbent Bike Full revolutions to 105 degrees x 5  minutes   Stepper L3 x 2 minutes small steps    Knee/Hip  Exercises: Machines for Strengthening   Cybex Leg Press 2x10 2 plate bilateral  1-19   Knee/Hip Exercises: Standing   Knee Flexion 10 reps   Forward Step Up 1 set;10 reps;Hand Hold: 2;Step Height: 4"   Functional Squat 10 reps   Knee/Hip Exercises: Supine   Other Supine Knee/Hip Exercises 4 way hip with SLS on left and UE support   Vasopneumatic   Number Minutes Vasopneumatic  15 minutes   Vasopnuematic Location  Knee   Vasopneumatic Pressure Low   Vasopneumatic Temperature  32                  PT Short Term Goals - 04/08/15 1036    PT SHORT TERM GOAL #1   Title pt will be able to verbalize and demonstrate technqiues to reduce L knee swelling and inflammation via RICE (05/20/2015)   Time 6   Period Weeks   Status Achieved            PT Long Term Goals - 04/29/15 1224    PT LONG TERM GOAL #1   Title pt will be I with all HEP given as of last visit (05/20/2015)   Time 6   Period Weeks   Status On-going   PT LONG TERM GOAL #2   Title pt will improve L knee AROM to >/=120 degrees of flexion and </= -5 degrees of extension to assist with functional and efficient gait pattern (05/20/2015)   Time 6   Period Weeks   Status On-going   PT LONG TERM GOAL #3   Title pt will be able to walk/ standing for >/= 30 minutes with LRAD with </=2/10 to assist with walking/ standing endurance (05/20/2015)   Period Weeks   Status On-going   PT LONG TERM GOAL #4   Title pt will be bale to perform SLR with no </= -5 degree quad lag to promote quad strengthening (05/20/2015)   Time 6   Period Weeks   Status On-going   PT LONG TERM GOAL #5   Title pt will improve her FOTO score by >/= 20 to demonstrate improvement of function (05/20/2015)   Time 6   Period Weeks   Status On-going               Plan - 04/29/15 1221    Clinical Impression Statement AROM improved. Able to initiate small steps on stair stepper without increased pain. Flexion AROM improved to 105. Continued exercises as previous within protocol. 3 way hip on right and SLS on left for proprioception.    PT Next Visit Plan review and progress HEP weekly; Check rom and quad laq next        Problem List Patient Active Problem List   Diagnosis Date Noted  . S/P ACL reconstruction 03/22/2015  . Hematuria 04/21/2013  . Hypertension 03/31/2013  . IUD (intrauterine device) in place 02/26/2013  . FH: breast cancer in first degree relative 02/26/2013  . Swelling of both ankles 08/01/2012  . Obesity 07/31/2012  . Migraine 07/31/2012    Dorene Ar, PTA 04/29/2015, 12:25 PM  [facility] [address] [place], [place], [address] Phone: [phone]   Fax:  [phone]  Name: Kathy White MRN: [id number] Date of Birth: [birth year]    PHYSICAL THERAPY DISCHARGE SUMMARY  Visits from Start of Care: 4  Current functional level related to goals / functional outcomes: See goals   Remaining deficits: Limited knee flexion, and strength. Limited  endurance with prolonged walking/ standing.    Education / Equipment: HEP, theraband,   Plan: Patient agrees to discharge.  Patient goals were not met. Patient is being discharged due to meeting the stated rehab goals.  ?????       Kristoffer Leamon PT, DPT, LAT, ATC  05/18/2015  3:53 PM

## 2015-08-04 ENCOUNTER — Emergency Department (HOSPITAL_COMMUNITY)
Admission: EM | Admit: 2015-08-04 | Discharge: 2015-08-04 | Disposition: A | Discharge status: Home / Self Care | Payer: Medicaid Other | Attending: Emergency Medicine | Admitting: Emergency Medicine

## 2015-08-04 ENCOUNTER — Emergency Department (HOSPITAL_COMMUNITY): Payer: Medicaid Other

## 2015-08-04 ENCOUNTER — Encounter (HOSPITAL_COMMUNITY): Payer: Self-pay | Admitting: *Deleted

## 2015-08-04 DIAGNOSIS — E669 Obesity, unspecified: Secondary | ICD-10-CM | POA: Insufficient documentation

## 2015-08-04 DIAGNOSIS — M179 Osteoarthritis of knee, unspecified: Secondary | ICD-10-CM | POA: Insufficient documentation

## 2015-08-04 DIAGNOSIS — I10 Essential (primary) hypertension: Secondary | ICD-10-CM | POA: Insufficient documentation

## 2015-08-04 DIAGNOSIS — Z79899 Other long term (current) drug therapy: Secondary | ICD-10-CM | POA: Diagnosis not present

## 2015-08-04 DIAGNOSIS — Z7982 Long term (current) use of aspirin: Secondary | ICD-10-CM | POA: Diagnosis not present

## 2015-08-04 DIAGNOSIS — Z6841 Body Mass Index (BMI) 40.0 and over, adult: Secondary | ICD-10-CM | POA: Insufficient documentation

## 2015-08-04 DIAGNOSIS — R0789 Other chest pain: Secondary | ICD-10-CM | POA: Diagnosis present

## 2015-08-04 LAB — CBC WITH DIFFERENTIAL/PLATELET
BASOS PCT: 0 %
Basophils Absolute: 0 10*3/uL (ref 0.0–0.1)
EOS ABS: 0.2 10*3/uL (ref 0.0–0.7)
Eosinophils Relative: 2 %
HCT: 39 % (ref 36.0–46.0)
Hemoglobin: 12.8 g/dL (ref 12.0–15.0)
Lymphocytes Relative: 22 %
Lymphs Abs: 1.7 10*3/uL (ref 0.7–4.0)
MCH: 27.5 pg (ref 26.0–34.0)
MCHC: 32.8 g/dL (ref 30.0–36.0)
MCV: 83.9 fL (ref 78.0–100.0)
MONO ABS: 0.6 10*3/uL (ref 0.1–1.0)
MONOS PCT: 8 %
Neutro Abs: 5.4 10*3/uL (ref 1.7–7.7)
Neutrophils Relative %: 68 %
Platelets: 259 10*3/uL (ref 150–400)
RBC: 4.65 MIL/uL (ref 3.87–5.11)
RDW: 13.6 % (ref 11.5–15.5)
WBC: 8 10*3/uL (ref 4.0–10.5)

## 2015-08-04 LAB — BASIC METABOLIC PANEL
Anion gap: 9 (ref 5–15)
BUN: 14 mg/dL (ref 6–20)
CALCIUM: 9.4 mg/dL (ref 8.9–10.3)
CO2: 27 mmol/L (ref 22–32)
CREATININE: 0.75 mg/dL (ref 0.44–1.00)
Chloride: 100 mmol/L — ABNORMAL LOW (ref 101–111)
GFR calc Af Amer: >60 mL/min (ref >60)
GLUCOSE: 101 mg/dL — AB (ref 65–99)
Potassium: 3.7 mmol/L (ref 3.5–5.1)
Sodium: 136 mmol/L (ref 135–145)

## 2015-08-04 LAB — TROPONIN I: Troponin I: <0.03 ng/mL (ref <0.031)

## 2015-08-04 LAB — D-DIMER, QUANTITATIVE (NOT AT ARMC)

## 2015-08-04 MED ORDER — IBUPROFEN 800 MG PO TABS: 800.0000 mg | ORAL_TABLET | Freq: Three times a day (TID) | ORAL | Status: DC

## 2015-08-04 NOTE — ED Notes (Signed)
Dr Rancour at bedside,  

## 2015-08-04 NOTE — ED Notes (Signed)
Pt c/o left side chest pressure that started this am, pressure radiates to left jaw and back area, is increased with palpation of chest area and cough, pt does report that she has had a recent cough,

## 2015-08-04 NOTE — ED Notes (Signed)
MD at bedside. 

## 2015-08-04 NOTE — Discharge Instructions (Signed)

## 2015-08-04 NOTE — ED Provider Notes (Signed)
CSN: [id number]     Arrival date & time 08/04/15  1953 History  By signing my name below, I, Julien Nordmann, attest that this documentation has been prepared under the direction and in the presence of Ezequiel Essex, MD. Electronically Signed: Julien Nordmann, ED Scribe. 08/04/2015. 9:33 PM.    Chief Complaint  Patient presents with  . Chest Pain      The history is provided by the patient. No language interpreter was used.   HPI Comments: Kathy White is a 35 y.o. female who has a PMhx of HTN presents to the Emergency Department complaining of sudden onset, constant, gradual worsening, pressure-like left sided chest pain that radiates to her back onset this morning. Pt notes this morning she had a headache, jaw pain, dizziness, nausea, and mild shortness of breath. Pt says there are no modifying factors but certain movements increases her pain. She has taken tylenol to alleviate her pain with no relief. She notes having ACL surgery 4 months ago and she is currently on Mirena. She denies hx of blood clots, abdominal pain, fever, rhinorrhea, sore throat, or abnormal leg swelling.  Past Medical History  Diagnosis Date  . FH: breast cancer in first degree relative 02/26/2013  . Hypertension   . Left ACL tear   . Acute meniscal tear of left knee   . OA (osteoarthritis) of knee   . History of HPV infection   . History of abnormal cervical Pap smear   . Wears glasses   . Migraine   . Vaginal Pap smear, abnormal   . Obesity    Past Surgical History  Procedure Laterality Date  . Cesarean section  11-14-2004  . Cervical biopsy  w/ loop electrode excision    . Nerve, tendon and artery repair Right 10/12/2014    Procedure: RIGHT RING FINGER AND RIGHT SMALL FINGER WOUND EXPLORATION WITH TENDON REPAIRS;  Surgeon: Iran Planas, MD;  Location: [facility];  Service: Orthopedics;  Laterality: Right;  . Laparoscopic cholecystectomy  12-20-2004  . Hand wound exploration and tendon repair's Right  10-12-2014    right ring and index fingers  . Knee arthroscopy with anterior cruciate ligament (acl) repair with hamstring graft Left 03/22/2015    Procedure: LEFT KNEE ARTHROSCOPY WITH ANTERIOR CRUCIATE LIGAMENT (ACL) AUTOGRAFT RECONSTRUCTION;  Surgeon: Sydnee Cabal, MD;  Location: [facility];  Service: Orthopedics;  Laterality: Left;  ANESTHESIA: GENERAL/ABDUCTOR CANAL BLOCK  . Exam under anesthesia with manipulation of knee Left 03/22/2015    Procedure: LEFT KNEE EXAM UNDER ANESTHESIA  ;  Surgeon: Sydnee Cabal, MD;  Location: [facility];  Service: Orthopedics;  Laterality: Left;  . Menisectomy Left 03/22/2015    Procedure: PARTIAL LEFT KNEE  LATERAL MENISECTOMY, CHONDROPLASTY;  Surgeon: Sydnee Cabal, MD;  Location: [facility];  Service: Orthopedics;  Laterality: Left;   Family History  Problem Relation Age of Onset  . Cancer Mother 71    breast  . Hypertension Mother   . Cancer Sister 39    breast  . Hypertension Sister   . Diabetes Sister   . Cancer Cousin 29    breast  . Thyroid disease Brother   . Diabetes Sister   . Hypertension Sister   . Neuropathy Sister   . Obesity Sister   . Hypertension Sister   . Thyroid disease Sister   . Cancer Sister     endometrial  . Diabetes Paternal Grandfather   . Cancer Maternal Grandmother   . Cancer  Maternal Grandfather   . Diabetes Father   . Hypertension Father   . Other Father     open heart surgery   Social History  Substance Use Topics  . Smoking status: Never Smoker   . Smokeless tobacco: Never Used  . Alcohol Use: No   OB History    Gravida Para Term Preterm AB TAB SAB Ectopic Multiple Living   1 1        1      Review of Systems  A complete 10 system review of systems was obtained and all systems are negative except as noted in the HPI and PMH.    Allergies  Oxycodone-acetaminophen and Percocet  Home Medications   Prior to Admission medications   Medication Sig  Start Date End Date Taking? Authorizing Provider  aspirin 325 MG tablet Take 325 mg by mouth 2 (two) times daily.   Yes Historical Provider, MD  CRANBERRY PO Take 1,400 mg by mouth daily.   Yes Historical Provider, MD  hydrochlorothiazide (MICROZIDE) 12.5 MG capsule Take 1 capsule (12.5 mg total) by mouth daily. Patient taking differently: Take 12.5 mg by mouth daily.  08/01/12  Yes Estill Dooms, NP  propranolol ER (INDERAL LA) 120 MG 24 hr capsule Take 1 capsule (120 mg total) by mouth daily. Patient taking differently: Take 120 mg by mouth daily.  08/01/12  Yes Estill Dooms, NP  fluticasone (FLONASE) 50 MCG/ACT nasal spray Place 2 sprays into the nose as needed for allergies.  07/20/13   Historical Provider, MD  ibuprofen (ADVIL,MOTRIN) 800 MG tablet Take 1 tablet (800 mg total) by mouth 3 (three) times daily. 08/04/15   Ezequiel Essex, MD   Triage vitals: BP 148/95 mmHg  Pulse 77  Temp(Src) 100 F (37.8 C) (Oral)  Resp 20  Ht 5\' 3"  (1.6 m)  Wt 252 lb (114.306 kg)  BMI 44.65 kg/m2  SpO2 96% Physical Exam  Constitutional: She is oriented to person, place, and time. She appears well-developed and well-nourished. No distress.  HENT:  Head: Normocephalic and atraumatic.  Mouth/Throat: Oropharynx is clear and moist. No oropharyngeal exudate.  Eyes: Conjunctivae and EOM are normal. Pupils are equal, round, and reactive to light.  Neck: Normal range of motion. Neck supple.  No meningismus.  Cardiovascular: Normal rate, regular rhythm, normal heart sounds and intact distal pulses.   No murmur heard. Pulmonary/Chest: Effort normal and breath sounds normal. No respiratory distress.  Reproducible left chest wall pain and pain underneath left breast, chaperone present  Abdominal: Soft. There is no tenderness. There is no rebound and no guarding.  Musculoskeletal: Normal range of motion. She exhibits no edema or tenderness.  Neurological: She is alert and oriented to person, place, and  time. No cranial nerve deficit. She exhibits normal muscle tone. Coordination normal.   5/5 strength throughout. CN 2-12 intact.Equal grip strength.   Skin: Skin is warm. No rash noted.  Psychiatric: She has a normal mood and affect. Her behavior is normal.  Nursing note and vitals reviewed.   ED Course  Procedures  DIAGNOSTIC STUDIES: Oxygen Saturation is 96% on RA, adequate by my interpretation.  COORDINATION OF CARE:  9:31 PM Will order DG chest. Discussed treatment plan which includes lab work with pt at bedside and pt agreed to plan.  Labs Review Labs Reviewed  BASIC METABOLIC PANEL - Abnormal; Notable for the following:    Chloride 100 (*)    Glucose, Bld 101 (*)    All other components within  normal limits  TROPONIN I  CBC WITH DIFFERENTIAL/PLATELET  D-DIMER, QUANTITATIVE (NOT AT [facility])    Imaging Review Dg Chest 2 View  08/04/2015  CLINICAL DATA:  Acute onset of generalized chest pain. Shortness of breath and headache. Congestion. Jaw pain. Initial encounter. EXAM: CHEST  2 VIEW COMPARISON:  None. FINDINGS: The lungs are well-aerated and clear. There is no evidence of focal opacification, pleural effusion or pneumothorax. The heart is normal in size; the mediastinal contour is within normal limits. No acute osseous abnormalities are seen. Clips are noted within the right upper quadrant, reflecting prior cholecystectomy. IMPRESSION: No acute cardiopulmonary process seen. Electronically Signed   By: Garald Balding M.D.   On: 08/04/2015 20:47   I have personally reviewed and evaluated these images and lab results as part of my medical decision-making.   EKG Interpretation   Date/Time:  Thursday Aug 04 2015 20:04:53 EDT Ventricular Rate:  83 PR Interval:  142 QRS Duration: 72 QT Interval:  388 QTC Calculation: 455 R Axis:   -13 Text Interpretation:  Normal sinus rhythm Normal ECG No significant change  since last tracing Confirmed by Orchid ([id number]) on  08/04/2015  9:28:10 PM      MDM   Final diagnoses:  Chest wall pain  Left-sided chest pain and pressure since this morning that radiates to back, worse with palpation and movement. Patient had knee surgery about 4 months ago.  Suspect MSK chest pain but will check D-dimer with recent surgery.  Chest x-ray negative. D-dimer negative.  Low suspicion for ACS or PE. Treatment for musculo skeletal chest pain with anti-inflammatories. Patient also likely getting upper respiratory illness. T max 100. Return precautions discussed.  I personally performed the services described in this documentation, which was scribed in my presence. The recorded information has been reviewed and is accurate.   Ezequiel Essex, MD 08/05/15 [phone]

## 2015-08-30 ENCOUNTER — Telehealth: Payer: Self-pay | Admitting: *Deleted

## 2015-08-30 NOTE — Telephone Encounter (Signed)
Pt says she had labs at PCP for increased hair growth and testosterone was elevated, sounds like other labs normal, she has IUD and has no periods, and PCP wants her to have US.she will keep me posted

## 2015-09-14 DIAGNOSIS — Z1371 Encounter for nonprocreative screening for genetic disease carrier status: Secondary | ICD-10-CM | POA: Insufficient documentation

## 2015-09-14 HISTORY — DX: Encounter for nonprocreative screening for genetic disease carrier status: Z13.71

## 2015-09-15 ENCOUNTER — Encounter: Payer: Self-pay | Admitting: Adult Health

## 2015-09-15 ENCOUNTER — Ambulatory Visit (INDEPENDENT_AMBULATORY_CARE_PROVIDER_SITE_OTHER): Payer: Medicaid Other | Admitting: Adult Health

## 2015-09-15 VITALS — BP 120/82 | HR 66 | Ht 63.0 in | Wt 256.5 lb

## 2015-09-15 DIAGNOSIS — Z803 Family history of malignant neoplasm of breast: Secondary | ICD-10-CM | POA: Diagnosis not present

## 2015-09-15 DIAGNOSIS — Z975 Presence of (intrauterine) contraceptive device: Secondary | ICD-10-CM

## 2015-09-15 DIAGNOSIS — D219 Benign neoplasm of connective and other soft tissue, unspecified: Secondary | ICD-10-CM

## 2015-09-15 DIAGNOSIS — D259 Leiomyoma of uterus, unspecified: Secondary | ICD-10-CM

## 2015-09-15 HISTORY — DX: Benign neoplasm of connective and other soft tissue, unspecified: D21.9

## 2015-09-15 NOTE — Progress Notes (Signed)
Subjective:     Patient ID: Kathy White, female   DOB: 09-10-1980, 35 y.o.   MRN: [id number]  HPI Kathy White is a 35 year old white female in wanting to discuss [place] she had 09/01/15 at [facility] in [place].Her PCP Kathy White ordered it because she has had increased hair on arms.The [place] was seen on care everywhere and shows normal ovaries, 3 cm posterior fundal fibroid and could not see IUD or endometrium well.Has not had any bleeding, will have occasional pain in breast, no masses, and her mammogram is past due, has strong family history of breast cancer.   Review of Systems Patient denies any headaches, hearing loss, fatigue, blurred vision, shortness of breath, chest pain, abdominal pain, problems with bowel movements, urination, or intercourse. No joint pain or mood swings.See HPI for positives.  Reviewed past medical,surgical, social and family history. Reviewed medications and allergies.     Objective:   Physical Exam BP 120/82 mmHg  Pulse 66  Ht 5\' 3"  (1.6 m)  Wt 256 lb 8 oz (116.348 kg)  BMI 45.45 kg/m2 Skin warm and dry.Pelvic: external genitalia is normal in appearance no lesions, vagina: white discharge without odor,urethra has no lesions or masses noted, cervix:smooth and bulbous,+short IUD string seen and shown to pt by mirror, uterus: normal size, shape and contour, non tender, no masses felt, adnexa: no masses or tenderness noted. Bladder is non tender and no masses felt. Reviewed [place] with her.She is aware of fibroid.Her IUD is due to be removed in October and she wants another one.    Face time 15 minutes with 50 % counseling.  Assessment:     IUD in place  Fibroid  FH breast cancer    Plan:     Return in 3 months for IUD removal and reinsertion with Kathy White  Get mammogram now and yearly

## 2015-09-15 NOTE — Patient Instructions (Signed)
Return in 3 months for IUD removal and reinsertion

## 2015-12-16 ENCOUNTER — Ambulatory Visit: Payer: Medicaid Other | Admitting: Obstetrics and Gynecology

## 2015-12-28 ENCOUNTER — Ambulatory Visit (INDEPENDENT_AMBULATORY_CARE_PROVIDER_SITE_OTHER): Payer: Medicaid Other | Admitting: Obstetrics and Gynecology

## 2015-12-28 ENCOUNTER — Encounter: Payer: Self-pay | Admitting: Obstetrics and Gynecology

## 2015-12-28 VITALS — BP 130/100 | HR 68 | Ht 62.0 in | Wt 253.4 lb

## 2015-12-28 DIAGNOSIS — N854 Malposition of uterus: Secondary | ICD-10-CM

## 2015-12-28 DIAGNOSIS — Z3202 Encounter for pregnancy test, result negative: Secondary | ICD-10-CM

## 2015-12-28 DIAGNOSIS — Z30433 Encounter for removal and reinsertion of intrauterine contraceptive device: Secondary | ICD-10-CM

## 2015-12-28 DIAGNOSIS — Z30432 Encounter for removal of intrauterine contraceptive device: Secondary | ICD-10-CM

## 2015-12-28 DIAGNOSIS — Z30014 Encounter for initial prescription of intrauterine contraceptive device: Secondary | ICD-10-CM | POA: Diagnosis not present

## 2015-12-28 DIAGNOSIS — Z30430 Encounter for insertion of intrauterine contraceptive device: Secondary | ICD-10-CM

## 2015-12-28 LAB — POCT URINE PREGNANCY: PREG TEST UR: NEGATIVE

## 2015-12-28 NOTE — Progress Notes (Addendum)
Patient ID: Kathy White, female   DOB: Jul 19, 1980, 35 y.o.   MRN: [id number]  Chief Complaint  Patient presents with  . removal and reinsertion of IUD    consent signed     Kathy White is a 35 y.o. G1P1 here for Mirena IUD removal and reinsertion. No GYN concerns.  Last pap smear  was normal  04/06/15. Pt reports extensive FHx of breast cancer with BRCA1 gene, which she states she has been screened for and does not carry. She states she gets yearly mammograms.   IUD Removal  Patient identified, informed consent performed. Discussed risks of irregular bleeding, cramping, infection, malpositioning or misplacement of the IUD outside the uterus which may require further procedures. Time out was performed. Speculum placed in the vagina. Cervix visualized. Cleaned with Betadine x 2. Grasped anteriorly with a single tooth tenaculum. The strings of the IUD were grasped and pulled using ring forceps. The IUD was successfully removed in its entirety. When the endocervical canal cannot be sounded to allow placement of subsequent IUD, the u/s is used to assess uterine position and shape. Bedside transvaginal US reveals retroverted, retroflexed uterus.   IUD Reinsertion  Uterus sounded to 9 cm.with a bit of difficulty due to marked retroversion, Lower endocervical canal required dilation with cervical dilators to finally be able to place IUD after several attempts. Mirena IUD placed per manufacturer's recommendations. Strings trimmed to 2-3 cm. Tenaculum was removed from posterior cervix,, good hemostasis noted. Patient tolerated procedure well.   Repeat bedside transvaginal US reveals well positioned IUD.   Patient was given post-procedure instructions.  Patient was also asked to check IUD strings periodically and offered follow up in 4 weeks for IUD check.   given Ibuprofen x 800 mg po for cramps post procedure. By signing my name below, I, Hansel Feinstein, attest that this documentation has been  prepared under the direction and in the presence of Jonnie Kind, MD. Electronically Signed: Hansel Feinstein, ED Scribe. 12/28/15. 9:16 AM.  I personally performed the services described in this documentation, which was SCRIBED in my presence. The recorded information has been reviewed and considered accurate. It has been edited as necessary during review. Jonnie Kind, MD

## 2016-01-02 ENCOUNTER — Other Ambulatory Visit: Payer: Self-pay | Admitting: Adult Health

## 2016-01-02 DIAGNOSIS — Z1231 Encounter for screening mammogram for malignant neoplasm of breast: Secondary | ICD-10-CM

## 2016-01-11 DIAGNOSIS — E282 Polycystic ovarian syndrome: Secondary | ICD-10-CM | POA: Insufficient documentation

## 2016-01-19 ENCOUNTER — Ambulatory Visit (HOSPITAL_COMMUNITY)
Admission: RE | Admit: 2016-01-19 | Discharge: 2016-01-19 | Disposition: A | Discharge status: Home / Self Care | Payer: Medicaid Other | Source: Ambulatory Visit | Attending: Adult Health | Admitting: Adult Health

## 2016-01-19 DIAGNOSIS — Z1231 Encounter for screening mammogram for malignant neoplasm of breast: Secondary | ICD-10-CM

## 2016-01-25 ENCOUNTER — Ambulatory Visit: Payer: Medicaid Other | Admitting: Obstetrics and Gynecology

## 2016-02-08 ENCOUNTER — Encounter: Payer: Self-pay | Admitting: Obstetrics and Gynecology

## 2016-02-08 ENCOUNTER — Ambulatory Visit (INDEPENDENT_AMBULATORY_CARE_PROVIDER_SITE_OTHER): Payer: Medicaid Other | Admitting: Obstetrics and Gynecology

## 2016-02-08 VITALS — BP 100/72 | HR 70 | Ht 62.0 in | Wt 251.8 lb

## 2016-02-08 DIAGNOSIS — Z30431 Encounter for routine checking of intrauterine contraceptive device: Secondary | ICD-10-CM | POA: Diagnosis not present

## 2016-02-08 NOTE — Progress Notes (Signed)
Patient ID: Kathy White, female   DOB: Jul 20, 1980, 35 y.o.   MRN: [id number]    [facility] Visit  @DATE @            Patient name: Kathy White MRN [id number]  Date of birth: 01-03-1981  CC & HPI:   Chief Complaint  Patient presents with  . check IUD placement     Kathy White is a 35 y.o. female presenting today for IUD check. Pt had Mirena IUD placed 12/28/15. INsertionWas noted to be difficult due to the patient's retroverted retroflexed uterus  ROS:  ROS No complaints, IUD check only Patient denies pain or bleeding at this time  Pertinent History Reviewed:   Reviewed Medical         Past Medical History:  Diagnosis Date  . Acute meniscal tear of left knee   . FH: breast cancer in first degree relative 02/26/2013  . Fibroid 09/15/2015  . History of abnormal cervical Pap smear   . History of HPV infection   . Hypertension   . Left ACL tear   . Migraine   . OA (osteoarthritis) of knee   . Obesity   . Vaginal Pap smear, abnormal   . Wears glasses                               Surgical Hx:    Past Surgical History:  Procedure Laterality Date  . CERVICAL BIOPSY  W/ LOOP ELECTRODE EXCISION    . CESAREAN SECTION  11-14-2004  . EXAM UNDER ANESTHESIA WITH MANIPULATION OF KNEE Left 03/22/2015   Procedure: LEFT KNEE EXAM UNDER ANESTHESIA  ;  Surgeon: Sydnee Cabal, MD;  Location: [facility];  Service: Orthopedics;  Laterality: Left;  . HAND WOUND EXPLORATION AND TENDON REPAIR'S Right 10-12-2014   right ring and index fingers  . KNEE ARTHROSCOPY WITH ANTERIOR CRUCIATE LIGAMENT (ACL) REPAIR WITH HAMSTRING GRAFT Left 03/22/2015   Procedure: LEFT KNEE ARTHROSCOPY WITH ANTERIOR CRUCIATE LIGAMENT (ACL) AUTOGRAFT RECONSTRUCTION;  Surgeon: Sydnee Cabal, MD;  Location: [facility];  Service: Orthopedics;  Laterality: Left;  ANESTHESIA: GENERAL/ABDUCTOR CANAL BLOCK  . LAPAROSCOPIC CHOLECYSTECTOMY  12-20-2004  . MENISECTOMY Left 03/22/2015    Procedure: PARTIAL LEFT KNEE  LATERAL MENISECTOMY, CHONDROPLASTY;  Surgeon: Sydnee Cabal, MD;  Location: [facility];  Service: Orthopedics;  Laterality: Left;  . NERVE, TENDON AND ARTERY REPAIR Right 10/12/2014   Procedure: RIGHT RING FINGER AND RIGHT SMALL FINGER WOUND EXPLORATION WITH TENDON REPAIRS;  Surgeon: Iran Planas, MD;  Location: [facility];  Service: Orthopedics;  Laterality: Right;   Medications: Reviewed & Updated - see associated section                       Current Outpatient Prescriptions:  .  CRANBERRY PO, Take 1,400 mg by mouth daily., Disp: , Rfl:  .  fluticasone (FLONASE) 50 MCG/ACT nasal spray, Place 2 sprays into the nose as needed for allergies. , Disp: , Rfl:  .  ibuprofen (ADVIL,MOTRIN) 800 MG tablet, Take 1 tablet (800 mg total) by mouth 3 (three) times daily. (Patient taking differently: Take 800 mg by mouth as needed. ), Disp: 21 tablet, Rfl: 0 .  levonorgestrel (MIRENA) 20 MCG/24HR IUD, 1 each by Intrauterine route once., Disp: , Rfl:  .  propranolol ER (INDERAL LA) 120 MG 24 hr capsule, Take 1 capsule (120 mg total) by  mouth daily. (Patient taking differently: Take 120 mg by mouth daily. ), Disp: 30 capsule, Rfl: 0 .  spironolactone (ALDACTONE) 50 MG tablet, Take by mouth., Disp: , Rfl:    Social History: Reviewed -  reports that she has never smoked. She has never used smokeless tobacco.  Objective Findings:  Vitals: Blood pressure 100/72, pulse 70, height 5\' 2"  (1.575 m), weight 251 lb 12.8 oz (114.2 kg).  Physical Examination: General appearance - alert, well appearing, and in no distress Mental status - alert, oriented to person, place, and time  Bedside Transvaginal US Proper IUD position indicated, retroverted and retroflexed uterus With both prongs apparently deployed well in the fundus of the uterus. Adnexa is normal there is no suspicion of malposition or perforation Assessment & Plan:   A:  1. Encounter for IUD check  2. IUD in  perfect position   P:  1. F/u PRN or in 1 year    By signing my name below, I, Hansel Feinstein, attest that this documentation has been prepared under the direction and in the presence of Jonnie Kind, MD. Electronically Signed: Hansel Feinstein, ED Scribe. 02/08/16. 2:10 PM.  I personally performed the services described in this documentation, which was SCRIBED in my presence. The recorded information has been reviewed and considered accurate. It has been edited as necessary during review. Jonnie Kind, MD

## 2017-05-30 DIAGNOSIS — R7303 Prediabetes: Secondary | ICD-10-CM | POA: Insufficient documentation

## 2017-05-30 DIAGNOSIS — E559 Vitamin D deficiency, unspecified: Secondary | ICD-10-CM | POA: Insufficient documentation

## 2017-06-24 ENCOUNTER — Encounter: Payer: Self-pay | Admitting: Adult Health

## 2017-06-24 ENCOUNTER — Ambulatory Visit: Payer: Medicaid Other | Admitting: Adult Health

## 2017-06-24 VITALS — BP 110/60 | HR 70 | Ht 63.0 in | Wt 261.5 lb

## 2017-06-24 DIAGNOSIS — Z975 Presence of (intrauterine) contraceptive device: Secondary | ICD-10-CM | POA: Diagnosis not present

## 2017-06-24 DIAGNOSIS — N921 Excessive and frequent menstruation with irregular cycle: Secondary | ICD-10-CM | POA: Diagnosis not present

## 2017-06-24 NOTE — Progress Notes (Signed)
Subjective:     Patient ID: Kathy White, female   DOB: 10/13/1980, 37 y.o.   MRN: [id number]  HPI Kathy White is a 37 year old white female, single in complaining of bleeding more with IUD.She is hoping to get sleeve surgery.  Review of Systems Bleeding more with IUD Occasion pain on right when sits, kinda stabbing Denies pain with sex  Reviewed past medical,surgical, social and family history. Reviewed medications and allergies.     Objective:   Physical Exam BP 110/60 (BP Location: Right Arm, Patient Position: Sitting, Cuff Size: Large)   Pulse 70   Ht 5\' 3"  (1.6 m)   Wt 261 lb 8 oz (118.6 kg)   BMI 46.32 kg/m    Skin warm and dry.Pelvic: external genitalia is normal in appearance no lesions, vagina: pink with good moisture, no odor,urethra has no lesions or masses noted, cervix:smooth,+IUD strings,, uterus: normal size, shape and contour, non tender, no masses felt,IUD in place on bedside US adnexa: no masses or tenderness noted. Bladder is non tender and no masses felt.  She declines megace.  Assessment:     Bleeding with IUD IUD in place     Plan:     F/U prn

## 2017-08-12 ENCOUNTER — Ambulatory Visit (INDEPENDENT_AMBULATORY_CARE_PROVIDER_SITE_OTHER): Payer: Medicaid Other | Admitting: Otolaryngology

## 2017-08-12 DIAGNOSIS — H9202 Otalgia, left ear: Secondary | ICD-10-CM | POA: Diagnosis not present

## 2017-08-12 DIAGNOSIS — H6983 Other specified disorders of Eustachian tube, bilateral: Secondary | ICD-10-CM | POA: Diagnosis not present

## 2017-09-09 HISTORY — PX: LAPAROSCOPIC GASTRIC BAND REMOVAL WITH LAPAROSCOPIC GASTRIC SLEEVE RESECTION: SHX6498

## 2017-10-14 DIAGNOSIS — Z9884 Bariatric surgery status: Secondary | ICD-10-CM | POA: Insufficient documentation

## 2017-10-14 DIAGNOSIS — K912 Postsurgical malabsorption, not elsewhere classified: Secondary | ICD-10-CM | POA: Insufficient documentation

## 2018-02-25 ENCOUNTER — Encounter: Payer: Self-pay | Admitting: Adult Health

## 2018-02-25 ENCOUNTER — Ambulatory Visit: Payer: Medicaid Other | Admitting: Adult Health

## 2018-02-25 VITALS — BP 150/90 | HR 86 | Ht 63.0 in | Wt 211.0 lb

## 2018-02-25 DIAGNOSIS — Z0000 Encounter for general adult medical examination without abnormal findings: Secondary | ICD-10-CM | POA: Diagnosis not present

## 2018-02-25 DIAGNOSIS — Z975 Presence of (intrauterine) contraceptive device: Secondary | ICD-10-CM

## 2018-02-25 DIAGNOSIS — Z803 Family history of malignant neoplasm of breast: Secondary | ICD-10-CM

## 2018-02-25 DIAGNOSIS — Z01419 Encounter for gynecological examination (general) (routine) without abnormal findings: Secondary | ICD-10-CM | POA: Insufficient documentation

## 2018-02-25 NOTE — Progress Notes (Signed)
Patient ID: Kathy White, female   DOB: 1980/09/29, 37 y.o.   MRN: [id number] History of Present Illness: Kathy White is a 37 year old white female, single, G1P1, in for well woman gyn exam, she had a normal pap with negative HPV. PCP is Horald Pollen MD.    Current Medications, Allergies, Past Medical History, Past Surgical History, Family History and Social History were reviewed in [employer] record.     Review of Systems: Patient denies any headaches, hearing loss, fatigue, blurred vision, shortness of breath, chest pain, abdominal pain, problems with bowel movements, urination, or intercourse)no sex in 15 months). No joint pain or mood swings. Had sleeve in July 2019 Dr Evorn Gong, and has lost about 50 lbs.    Physical Exam:BP (!) 150/90 (BP Location: Left Arm, Patient Position: Sitting, Cuff Size: Normal)   Pulse 86   Ht 5\' 3"  (1.6 m)   Wt 211 lb (95.7 kg)   BMI 37.38 kg/m  General:  Well developed, well nourished, no acute distress Skin:  Warm and dry Neck:  Midline trachea, normal thyroid, good ROM, no lymphadenopathy Lungs; Clear to auscultation bilaterally Breast:  No dominant palpable mass, retraction, or nipple discharge Cardiovascular: Regular rate and rhythm Abdomen:  Soft, non tender, no hepatosplenomegaly Pelvic:  External genitalia is normal in appearance, no lesions.  The vagina is normal in appearance. Urethra has no lesions or masses. The cervix is bulbous. +IUD strings. Uterus is felt to be normal size, shape, and contour.  No adnexal masses or tenderness noted.Bladder is non tender, no masses felt. Extremities/musculoskeletal:  No swelling or varicosities noted, no clubbing or cyanosis Psych:  No mood changes, alert and cooperative,seems happy PHQ 2 score 0. Fall risk is low. Examination chaperoned by Estill Bamberg Rash LPN.  Impression: 1. Encounter for well woman exam with routine gynecological exam   2. IUD (intrauterine device) in place   3. FH:  breast cancer in first degree relative       Plan: Physical and pap in 1 year Get mammogram Labs with surgeon sp sleeve

## 2018-05-13 ENCOUNTER — Other Ambulatory Visit (HOSPITAL_COMMUNITY): Payer: Self-pay | Admitting: Adult Health

## 2018-05-13 DIAGNOSIS — Z1231 Encounter for screening mammogram for malignant neoplasm of breast: Secondary | ICD-10-CM

## 2018-05-21 ENCOUNTER — Other Ambulatory Visit: Payer: Self-pay

## 2018-05-21 ENCOUNTER — Ambulatory Visit (HOSPITAL_COMMUNITY)
Admission: RE | Admit: 2018-05-21 | Discharge: 2018-05-21 | Disposition: A | Discharge status: Home / Self Care | Payer: Medicaid Other | Source: Ambulatory Visit | Attending: Adult Health | Admitting: Adult Health

## 2018-05-21 ENCOUNTER — Encounter (HOSPITAL_COMMUNITY): Payer: Self-pay

## 2018-05-21 DIAGNOSIS — Z1231 Encounter for screening mammogram for malignant neoplasm of breast: Secondary | ICD-10-CM | POA: Diagnosis present

## 2018-06-03 ENCOUNTER — Telehealth: Payer: Self-pay | Admitting: Adult Health

## 2018-06-03 NOTE — Telephone Encounter (Signed)
Patient called, looking for lab results.  [phone]

## 2018-06-03 NOTE — Telephone Encounter (Signed)
Returned call patient was wanting Mammogram results. Informed her of negative results.

## 2018-09-15 ENCOUNTER — Telehealth: Payer: Self-pay | Admitting: Adult Health

## 2018-09-15 NOTE — Telephone Encounter (Signed)
Patient called stating that she would like a call back from Jennifer, Patient did not state the reason why. Please contact pt °

## 2018-09-15 NOTE — Telephone Encounter (Signed)
Called patient, she is having vaginal pain and irritation. Also wants to make sure IUD is ok because she is having a pain and pressure. Patient scheduled for a visit tomorrow.

## 2018-09-16 ENCOUNTER — Ambulatory Visit (INDEPENDENT_AMBULATORY_CARE_PROVIDER_SITE_OTHER): Payer: Medicaid Other | Admitting: Adult Health

## 2018-09-16 ENCOUNTER — Encounter: Payer: Self-pay | Admitting: Adult Health

## 2018-09-16 ENCOUNTER — Other Ambulatory Visit: Payer: Self-pay

## 2018-09-16 VITALS — BP 131/95 | HR 76 | Ht 63.0 in | Wt 187.0 lb

## 2018-09-16 DIAGNOSIS — R102 Pelvic and perineal pain: Secondary | ICD-10-CM | POA: Diagnosis not present

## 2018-09-16 DIAGNOSIS — Z975 Presence of (intrauterine) contraceptive device: Secondary | ICD-10-CM | POA: Diagnosis not present

## 2018-09-16 DIAGNOSIS — Z113 Encounter for screening for infections with a predominantly sexual mode of transmission: Secondary | ICD-10-CM | POA: Insufficient documentation

## 2018-09-16 HISTORY — DX: Pelvic and perineal pain: R10.2

## 2018-09-16 NOTE — Progress Notes (Signed)
Patient ID: Kathy White, female   DOB: 12/03/1980, 38 y.o.   MRN: [id number] History of Present Illness: Kathy White is a 38 year old white female, single, G1P0101, in complaining of pain low stomach and burning and vaginal irritation.She had sleeve surgery 1 year ago and has lost 83 lbs, and had appt with surgeon this am and labs all good, she says.BF was in [place] for over a year.  PCP Dr Darron Doom.    Current Medications, Allergies, Past Medical History, Past Surgical History, Family History and Social History were reviewed in [employer] record.     Review of Systems: Patient denies any headaches, hearing loss, fatigue, blurred vision, shortness of breath, chest pain, problems with bowel movements, urination, or intercourse. No joint pain or mood swings. Has vaginal irritation and had sharp pain in lower stomach, now burning sensation Has spotted some with IUD  Physical Exam:BP (!) 131/95 (BP Location: Left Arm, Patient Position: Sitting, Cuff Size: Normal)   Pulse 76   Ht 5\' 3"  (1.6 m)   Wt 187 lb (84.8 kg)   BMI 33.13 kg/m  General:  Well developed, well nourished, no acute distress Skin:  Warm and dry Pelvic:  External genitalia is normal in appearance, no lesions.  The vagina is normal in appearance. Urethra has no lesions or masses. The cervix is bulbous.+IUD strings, nuswab obtained.  Uterus is felt to be normal size, shape, and contour,mildly tender.  No adnexal masses or tenderness noted.Bladder is non tender, no masses felt. Psych:  No mood changes, alert and cooperative,seems happy Examination chaperoned by Alice Rieger RN.  Discussed that she could have had a cyst, will get [place] to assess and nuswab will check for infection. Face time 15 minutes with 50% discussing plan of care.   Impression: 1. Pelvic pain   2. IUD (intrauterine device) in place   3. Screening examination for STD (sexually transmitted disease)       Plan: Nuswab sent Return  for GYN [place] in about a week, will talk when results back

## 2018-09-23 LAB — NUSWAB VAGINITIS PLUS (VG+)
Candida albicans, NAA: NEGATIVE
Candida glabrata, NAA: NEGATIVE
Chlamydia trachomatis, NAA: NEGATIVE
Neisseria gonorrhoeae, NAA: NEGATIVE
Trich vag by NAA: NEGATIVE

## 2018-10-08 ENCOUNTER — Other Ambulatory Visit: Payer: Self-pay

## 2018-10-08 ENCOUNTER — Ambulatory Visit (INDEPENDENT_AMBULATORY_CARE_PROVIDER_SITE_OTHER): Payer: Medicaid Other

## 2018-10-08 DIAGNOSIS — Z975 Presence of (intrauterine) contraceptive device: Secondary | ICD-10-CM

## 2018-10-08 DIAGNOSIS — R102 Pelvic and perineal pain: Secondary | ICD-10-CM | POA: Diagnosis not present

## 2018-10-08 NOTE — Progress Notes (Addendum)
heterogeneous retroverted uterus with mult.small fibroids (#1) subserosal fundal fibroid 2.4 x 2.1 x 2.5 cm,(#2) posterior intramural fibroid 2 x 1.2 x 1.7 cm,mult.small nabothian cysts,normal ovaries bilat,ovaries appear mobile,EEC 3 mm,IUD is centrally located with in the endometrium,no free fluid,no pain during ultrasound

## 2018-10-10 ENCOUNTER — Telehealth: Payer: Self-pay | Admitting: Adult Health

## 2018-10-10 NOTE — Telephone Encounter (Signed)
Pt aware IUD in place, has small fibroids, no pain now

## 2018-12-15 ENCOUNTER — Ambulatory Visit (INDEPENDENT_AMBULATORY_CARE_PROVIDER_SITE_OTHER): Payer: Medicaid Other | Admitting: Otolaryngology

## 2018-12-15 ENCOUNTER — Other Ambulatory Visit: Payer: Self-pay

## 2018-12-15 DIAGNOSIS — H9311 Tinnitus, right ear: Secondary | ICD-10-CM | POA: Diagnosis not present

## 2019-07-14 ENCOUNTER — Other Ambulatory Visit: Payer: Medicaid Other | Admitting: Adult Health

## 2019-07-21 ENCOUNTER — Ambulatory Visit (INDEPENDENT_AMBULATORY_CARE_PROVIDER_SITE_OTHER): Payer: Medicaid Other | Admitting: Adult Health

## 2019-07-21 ENCOUNTER — Encounter: Payer: Self-pay | Admitting: Adult Health

## 2019-07-21 ENCOUNTER — Other Ambulatory Visit (HOSPITAL_COMMUNITY)
Admission: RE | Admit: 2019-07-21 | Discharge: 2019-07-21 | Disposition: A | Discharge status: Home / Self Care | Payer: Medicaid Other | Source: Ambulatory Visit | Attending: Adult Health | Admitting: Adult Health

## 2019-07-21 ENCOUNTER — Other Ambulatory Visit: Payer: Self-pay

## 2019-07-21 VITALS — BP 139/93 | HR 78 | Ht 63.0 in | Wt 198.0 lb

## 2019-07-21 DIAGNOSIS — Z0000 Encounter for general adult medical examination without abnormal findings: Secondary | ICD-10-CM | POA: Insufficient documentation

## 2019-07-21 DIAGNOSIS — R1032 Left lower quadrant pain: Secondary | ICD-10-CM | POA: Insufficient documentation

## 2019-07-21 DIAGNOSIS — Z975 Presence of (intrauterine) contraceptive device: Secondary | ICD-10-CM

## 2019-07-21 DIAGNOSIS — Z01419 Encounter for gynecological examination (general) (routine) without abnormal findings: Secondary | ICD-10-CM

## 2019-07-21 DIAGNOSIS — Z803 Family history of malignant neoplasm of breast: Secondary | ICD-10-CM

## 2019-07-21 NOTE — Progress Notes (Signed)
Patient ID: Kathy White, female   DOB: 1980/09/25, 39 y.o.   MRN: [id number] History of Present Illness: Kathy White is a 39 year old white female,single, G1P0101, in for a well woman gyn exam and pap. PCP is Dr Darron Doom    Current Medications, Allergies, Past Medical History, Past Surgical History, Family History and Social History were reviewed in [facility] record.     Review of Systems: Patient denies any headaches, hearing loss, fatigue, blurred vision, shortness of breath, chest pain, abdominal pain, problems with bowel movements, urination, or intercourse. No joint pain or mood swings. Has LLQ pain at times,goes and comes, has IUD and can not feel string    Physical Exam:BP (!) 139/93 (BP Location: Left Arm, Patient Position: Sitting, Cuff Size: Normal)   Pulse 78   Ht 5\' 3"  (1.6 m)   Wt 198 lb (89.8 kg)   LMP 06/21/2019 (Approximate)   BMI 35.07 kg/m  General:  Well developed, well nourished, no acute distress Skin:  Warm and dry Neck:  Midline trachea, normal thyroid, good ROM, no lymphadenopathy Lungs; Clear to auscultation bilaterally Breast:  No dominant palpable mass, retraction, or nipple discharge Cardiovascular: Regular rate and rhythm Abdomen:  Soft, non tender, no hepatosplenomegaly Pelvic:  External genitalia is normal in appearance, no lesions.  The vagina is normal in appearance. Urethra has no lesions or masses. The cervix is bulbous.+IUD strings, pap with GC/CHL and high risk HPV 16/18 genotyping performed.   Uterus is felt to be normal size, shape, and contour.  No adnexal masses or tenderness noted.Bladder is non tender, no masses felt. Extremities/musculoskeletal:  No swelling or varicosities noted, no clubbing or cyanosis Psych:  No mood changes, alert and cooperative,seems happy Fall risk is low. PHQ 2 score is 0 Examination chaperoned by Dwyane Dee LPN  Impression and Plan:   1. Routine medical exam Pap sent  2. Encounter for  gynecological examination with Papanicolaou smear of cervix Pap sent  Physical in 1 year Pap in 3 years if normal Get mammogram this year Labs with PCP   3. IUD (intrauterine device) in place She could feel strings after sex  4. LLQ pain Probable ovulation, if pain increases call,could get [place]   5. FH: breast cancer in first degree relative Get mammogram yearly

## 2019-07-22 LAB — CYTOLOGY - PAP
Chlamydia: NEGATIVE
Comment: NEGATIVE
Comment: NEGATIVE
Comment: NORMAL
Diagnosis: NEGATIVE
High risk HPV: NEGATIVE
Neisseria Gonorrhea: NEGATIVE

## 2019-08-21 ENCOUNTER — Encounter: Payer: Self-pay | Admitting: Physician Assistant

## 2019-08-25 ENCOUNTER — Ambulatory Visit: Payer: Medicaid Other | Admitting: Adult Health

## 2019-08-25 ENCOUNTER — Encounter: Payer: Self-pay | Admitting: Adult Health

## 2019-08-25 VITALS — BP 119/81 | HR 76 | Ht 63.0 in | Wt 196.8 lb

## 2019-08-25 DIAGNOSIS — N898 Other specified noninflammatory disorders of vagina: Secondary | ICD-10-CM | POA: Insufficient documentation

## 2019-08-25 DIAGNOSIS — Z975 Presence of (intrauterine) contraceptive device: Secondary | ICD-10-CM

## 2019-08-25 DIAGNOSIS — R1032 Left lower quadrant pain: Secondary | ICD-10-CM | POA: Diagnosis not present

## 2019-08-25 MED ORDER — FLUCONAZOLE 150 MG PO TABS
ORAL_TABLET | ORAL | 1 refills | Status: DC
Start: 2019-08-25 — End: 2020-09-13

## 2019-08-25 NOTE — Progress Notes (Signed)
  Subjective:     Patient ID: Kathy White, female   DOB: 10-06-1980, 39 y.o.   MRN: [id number]  HPI [name] is a 39 year old white female,single, G1P0101 in complaining of LLQ pain,had cyst in the past, and is being treated for IBS and also has vaginal irritation, she saw PCP and had negative STD tests but was treated with doxycycline for a tick bite. PCP is Dr Darron Doom.   Review of Systems Has LLQ pain at times, has had cyst in the past +vaginal irritation, had negative STD tests at PCP Reviewed past medical,surgical, social and family history. Reviewed medications and allergies.     Objective:   Physical Exam BP 119/81 (BP Location: Right Arm, Patient Position: Sitting, Cuff Size: Normal)   Pulse 76   Ht 5\' 3"  (1.6 m)   Wt 196 lb 12.8 oz (89.3 kg)   BMI 34.86 kg/m  Skin warm and dry.Pelvic: external genitalia is normal in appearance no lesions, vagina: scant discharge without odor,urethra has no lesions or masses noted, cervix:smooth, +IUD strings, uterus: normal size, shape and contour, non tender, no masses felt, adnexa: no masses or tenderness noted. Bladder is non tender and no masses felt.  Examination chaperoned by Dwyane Dee LPN    Assessment:     1. LLQ pain Will get GYN [place] to assess   2. IUD (intrauterine device) in place   3. Vaginal irritation Will rx diflucan Meds ordered this encounter  Medications  . fluconazole (DIFLUCAN) 150 MG tablet    Sig: Take 1 now and 1 in 3 days    Dispense:  2 tablet    Refill:  1    Order Specific Question:   Supervising Provider    Answer:   Florian Buff [[id number]]      Plan:     Return for GYN [place] and will talk when results back

## 2019-08-26 ENCOUNTER — Other Ambulatory Visit (HOSPITAL_COMMUNITY): Payer: Self-pay | Admitting: Adult Health

## 2019-08-26 DIAGNOSIS — Z1231 Encounter for screening mammogram for malignant neoplasm of breast: Secondary | ICD-10-CM

## 2019-09-02 ENCOUNTER — Other Ambulatory Visit: Payer: Self-pay

## 2019-09-02 ENCOUNTER — Ambulatory Visit (HOSPITAL_COMMUNITY)
Admission: RE | Admit: 2019-09-02 | Discharge: 2019-09-02 | Disposition: A | Discharge status: Home / Self Care | Payer: Medicaid Other | Source: Ambulatory Visit | Attending: Adult Health | Admitting: Adult Health

## 2019-09-02 DIAGNOSIS — Z1231 Encounter for screening mammogram for malignant neoplasm of breast: Secondary | ICD-10-CM

## 2019-09-07 ENCOUNTER — Other Ambulatory Visit (HOSPITAL_COMMUNITY): Payer: Self-pay | Admitting: Adult Health

## 2019-09-07 DIAGNOSIS — R928 Other abnormal and inconclusive findings on diagnostic imaging of breast: Secondary | ICD-10-CM

## 2019-09-10 ENCOUNTER — Ambulatory Visit (INDEPENDENT_AMBULATORY_CARE_PROVIDER_SITE_OTHER): Payer: Medicaid Other

## 2019-09-10 DIAGNOSIS — R1032 Left lower quadrant pain: Secondary | ICD-10-CM | POA: Diagnosis not present

## 2019-09-10 DIAGNOSIS — Z975 Presence of (intrauterine) contraceptive device: Secondary | ICD-10-CM

## 2019-09-10 NOTE — Progress Notes (Addendum)
PELVIC US TA/TV: heterogeneous retroverted uterus with mult fibroids (#1) fundal subserosal fibroid  2.7 x 2.2 x 1.6 cm,(#2) posterior intramural 1.9 x 1.2 x 2 cm,(#3) anterior subserosal 1.6 x 1.1 x 1.2 cm, EEC 1.9 mm,IUD is centrally located within the endometrium,normal ovaries,ovaries appear mobile,no free fluid,no pain during ultrasound   Chaperone Peggy

## 2019-09-15 ENCOUNTER — Ambulatory Visit (HOSPITAL_COMMUNITY)
Admission: RE | Admit: 2019-09-15 | Discharge: 2019-09-15 | Disposition: A | Discharge status: Home / Self Care | Payer: Medicaid Other | Source: Ambulatory Visit | Attending: Adult Health | Admitting: Adult Health

## 2019-09-15 ENCOUNTER — Telehealth: Payer: Self-pay | Admitting: Adult Health

## 2019-09-15 ENCOUNTER — Other Ambulatory Visit: Payer: Self-pay

## 2019-09-15 DIAGNOSIS — R928 Other abnormal and inconclusive findings on diagnostic imaging of breast: Secondary | ICD-10-CM

## 2019-09-15 NOTE — Telephone Encounter (Signed)
Pt aware that US showed IUD in place, ovaries normal and has multiple small fibroids.Once MD reads will show up in mychart

## 2019-09-24 ENCOUNTER — Ambulatory Visit: Payer: Medicaid Other | Admitting: Physician Assistant

## 2019-10-02 DIAGNOSIS — M170 Bilateral primary osteoarthritis of knee: Secondary | ICD-10-CM | POA: Insufficient documentation

## 2019-10-08 DIAGNOSIS — M25572 Pain in left ankle and joints of left foot: Secondary | ICD-10-CM | POA: Insufficient documentation

## 2019-10-08 HISTORY — DX: Pain in left ankle and joints of left foot: M25.572

## 2019-12-18 IMAGING — MG DIGITAL SCREENING BILATERAL MAMMOGRAM WITH TOMO AND CAD
8 series · 8 of 24 positions shown · non-contrast
Comparison: Previous exam(s).

CLINICAL DATA: Screening.

EXAM:
DIGITAL SCREENING BILATERAL MAMMOGRAM WITH TOMO AND CAD

[R CC synth-2D]
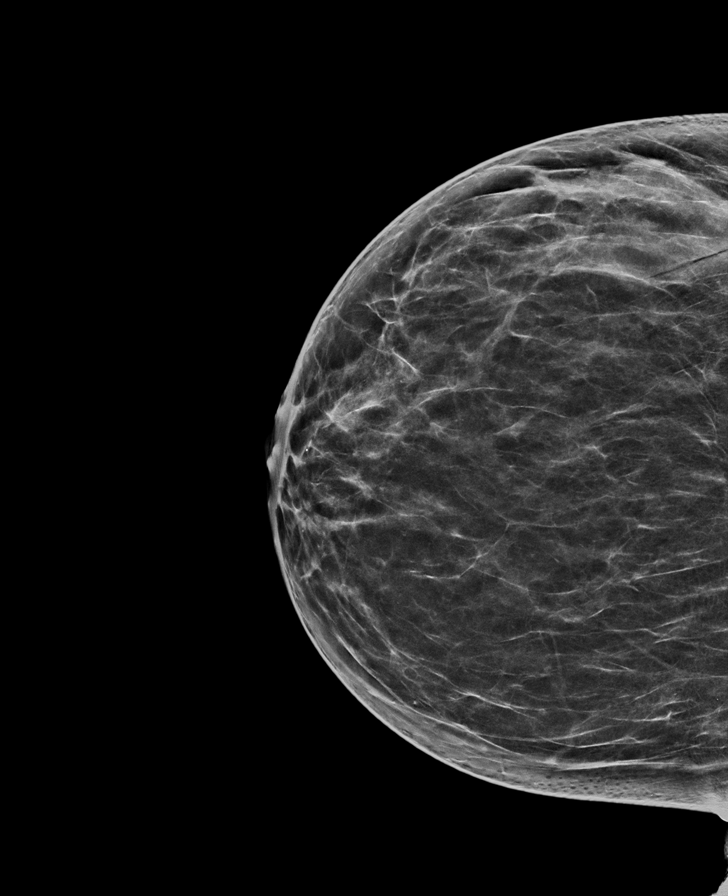

[L CC synth-2D]
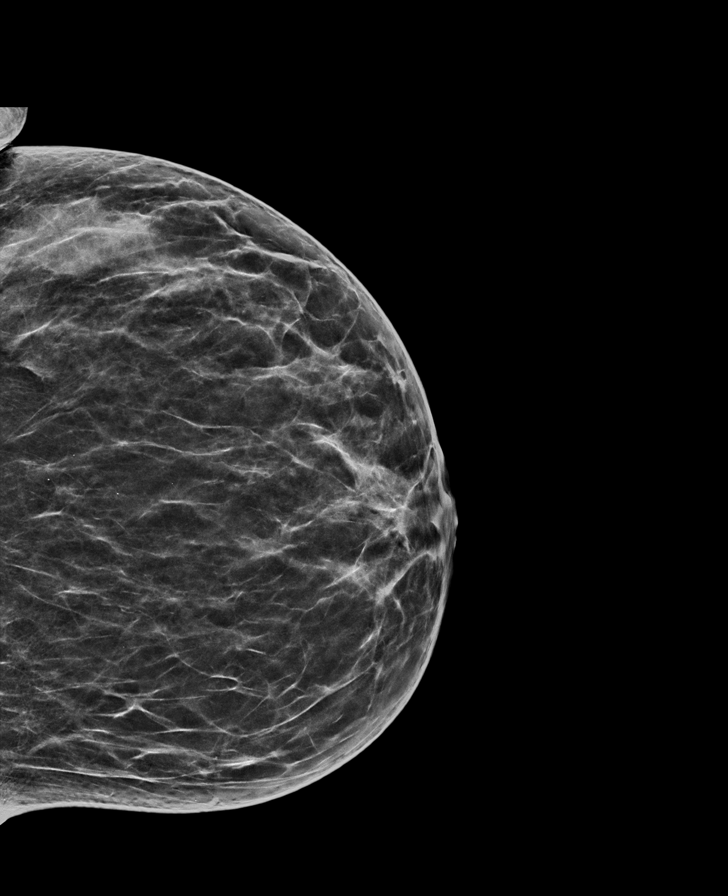

[L MLO synth-2D]
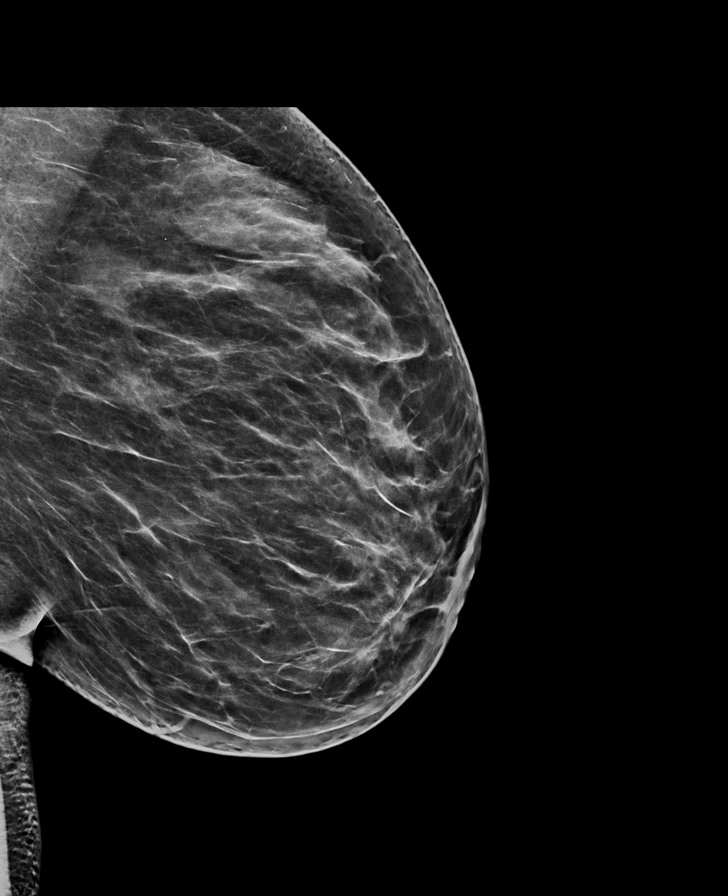

[R MLO synth-2D]
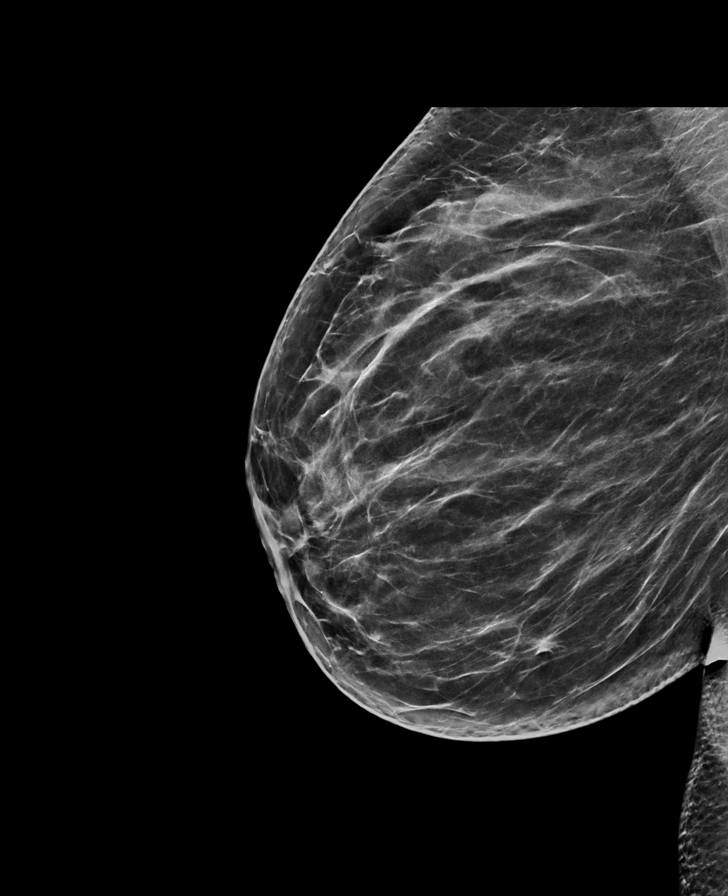

[L CC tomo · tomo slice 33/64.0]
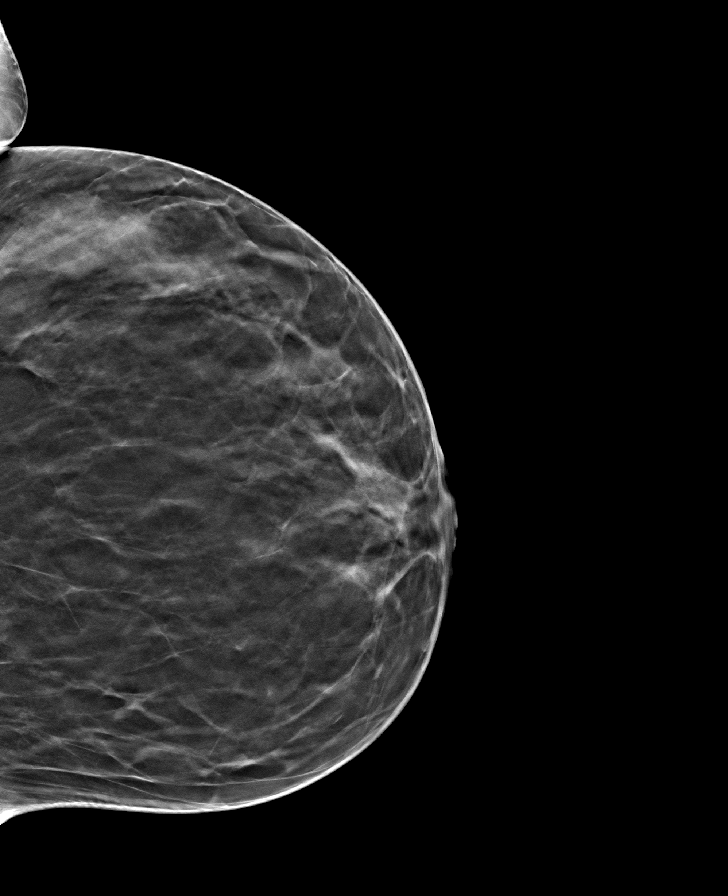

[L MLO tomo · tomo slice 37/74.0]
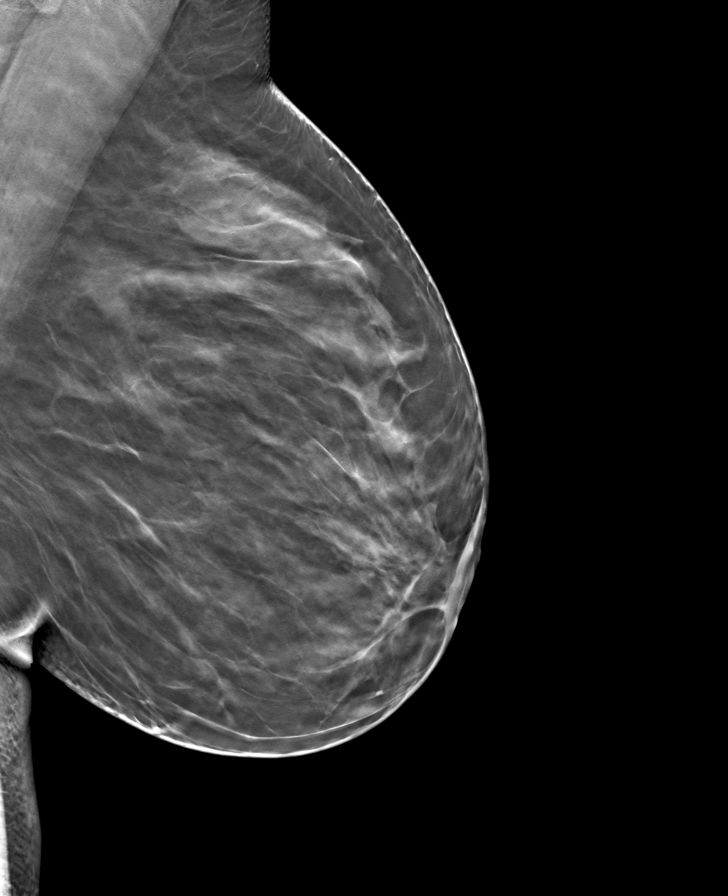

[R CC tomo · tomo slice 35/68.0]
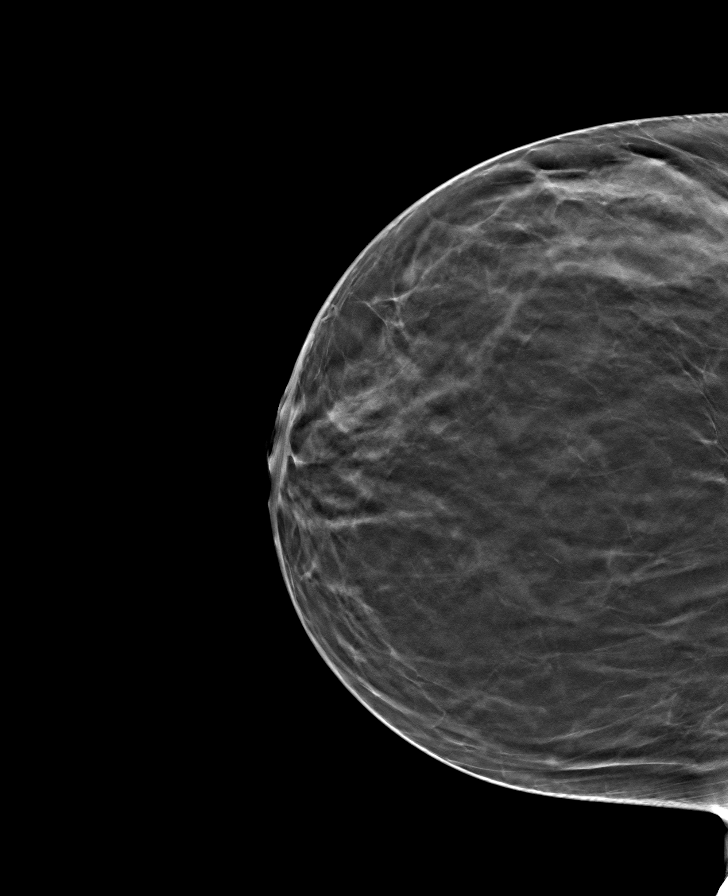

[R MLO tomo · tomo slice 39/77.0]
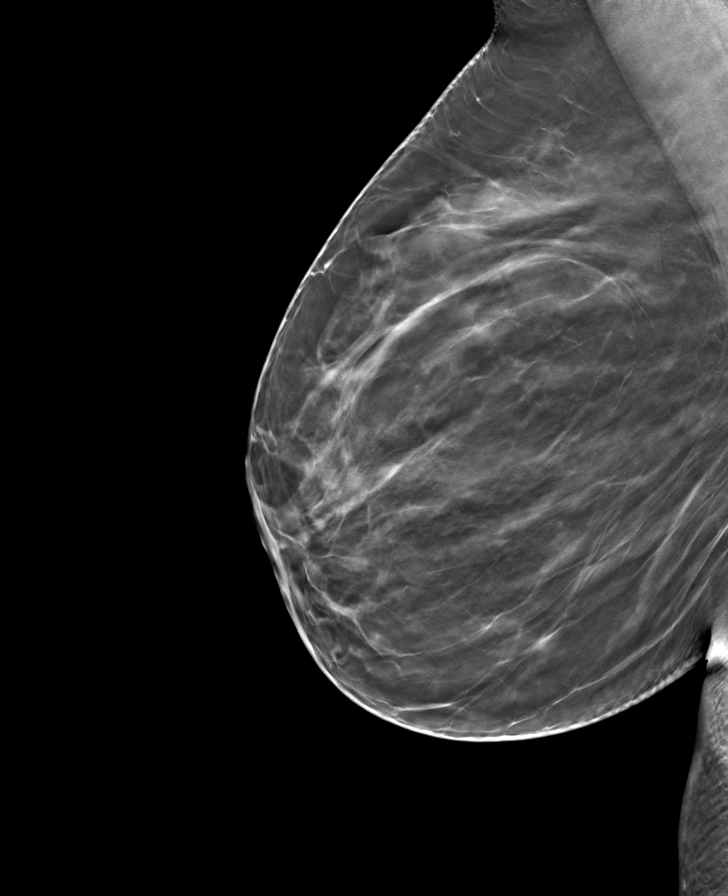

[8 of 24 positions shown; findings below may reference images not displayed]

ACR Breast Density Category c: The breast tissue is heterogeneously
dense, which may obscure small masses.
FINDINGS: There are no findings suspicious for malignancy. Images were
processed with CAD.
IMPRESSION: No mammographic evidence of malignancy. A result letter of this
screening mammogram will be mailed directly to the patient.

RECOMMENDATION:
Screening mammogram at age 42. (Code:KQ-8-OP1)

BI-RADS CATEGORY  1: Negative.

## 2020-02-11 ENCOUNTER — Telehealth: Payer: Self-pay | Admitting: Adult Health

## 2020-02-11 DIAGNOSIS — R928 Other abnormal and inconclusive findings on diagnostic imaging of breast: Secondary | ICD-10-CM

## 2020-02-11 NOTE — Telephone Encounter (Signed)
Pt states radiology told her we need to order & schedule her diagnostic mammo Due to recent mammo results of density & cysts - please advise  Pt is having normal period flow but bad cramps & she has an IUD, wonders if she should be concerned  Please advise & notify pt

## 2020-02-11 NOTE — Telephone Encounter (Signed)
Cramping is becoming worse with periods. Has the Mirena IUD. Started having regular periods 4 months ago. Is sexually active. No new sex partners. Can you put order in diagnostic mammogram?

## 2020-02-12 NOTE — Addendum Note (Signed)
Addended by: Derrek Monaco A on: 02/12/2020 10:21 AM   Modules accepted: Orders

## 2020-02-12 NOTE — Telephone Encounter (Signed)
Rt breast US scheduled at [facility] for 03/15/20 at 9:30 am  Having periods with IUD and some cramping can feel strings, if continues, make appt

## 2020-03-15 ENCOUNTER — Inpatient Hospital Stay (HOSPITAL_COMMUNITY): Admission: RE | Admit: 2020-03-15 | Payer: Medicaid Other | Source: Ambulatory Visit

## 2020-03-22 ENCOUNTER — Other Ambulatory Visit: Payer: Self-pay

## 2020-03-22 ENCOUNTER — Ambulatory Visit (HOSPITAL_COMMUNITY)
Admission: RE | Admit: 2020-03-22 | Discharge: 2020-03-22 | Disposition: A | Discharge status: Home / Self Care | Payer: Medicaid Other | Source: Ambulatory Visit | Attending: Adult Health | Admitting: Adult Health

## 2020-03-22 DIAGNOSIS — R928 Other abnormal and inconclusive findings on diagnostic imaging of breast: Secondary | ICD-10-CM | POA: Diagnosis present

## 2020-07-21 ENCOUNTER — Ambulatory Visit (INDEPENDENT_AMBULATORY_CARE_PROVIDER_SITE_OTHER): Payer: Medicaid Other | Admitting: Adult Health

## 2020-07-21 ENCOUNTER — Other Ambulatory Visit (HOSPITAL_COMMUNITY)
Admission: RE | Admit: 2020-07-21 | Discharge: 2020-07-21 | Disposition: A | Discharge status: Home / Self Care | Payer: Medicaid Other | Source: Ambulatory Visit | Attending: Adult Health | Admitting: Adult Health

## 2020-07-21 ENCOUNTER — Encounter: Payer: Self-pay | Admitting: Adult Health

## 2020-07-21 ENCOUNTER — Other Ambulatory Visit: Payer: Self-pay

## 2020-07-21 VITALS — BP 132/87 | HR 69 | Ht 63.0 in | Wt 217.8 lb

## 2020-07-21 DIAGNOSIS — Z803 Family history of malignant neoplasm of breast: Secondary | ICD-10-CM

## 2020-07-21 DIAGNOSIS — R635 Abnormal weight gain: Secondary | ICD-10-CM | POA: Insufficient documentation

## 2020-07-21 DIAGNOSIS — Z113 Encounter for screening for infections with a predominantly sexual mode of transmission: Secondary | ICD-10-CM | POA: Diagnosis not present

## 2020-07-21 DIAGNOSIS — N6311 Unspecified lump in the right breast, upper outer quadrant: Secondary | ICD-10-CM

## 2020-07-21 DIAGNOSIS — Z975 Presence of (intrauterine) contraceptive device: Secondary | ICD-10-CM | POA: Diagnosis not present

## 2020-07-21 DIAGNOSIS — Z01419 Encounter for gynecological examination (general) (routine) without abnormal findings: Secondary | ICD-10-CM | POA: Diagnosis not present

## 2020-07-21 NOTE — Progress Notes (Signed)
Patient ID: Kathy White, female   DOB: 1980-04-26, 40 y.o.   MRN: [id number] History of Present Illness: Kathy White is a 40 year old white female, with DP, G1P0101, in for a well woman gyn exam. Lab Results  Component Value Date   DIAGPAP  07/21/2019    - Negative for intraepithelial lesion or malignancy (NILM)   [place] Negative 07/21/2019  PCP is Dr Darron Doom.   Current Medications, Allergies, Past Medical History, Past Surgical History, Family History and Social History were reviewed in [employer] record.     Review of Systems: Patient denies any headaches, hearing loss, fatigue, blurred vision, shortness of breath, chest pain, abdominal pain, problems with bowel movements, urination, or intercourse. No joint pain or mood swings. Has pain right side about 6 weeks ago, none now Has tender area right under arm that has been there a while Has gained weight    Physical Exam:BP 132/87 (BP Location: Left Arm, Patient Position: Sitting, Cuff Size: Large)   Pulse 69   Ht 5\' 3"  (1.6 m)   Wt 217 lb 12.8 oz (98.8 kg)   LMP  (LMP Unknown)   BMI 38.58 kg/m  General:  Well developed, well nourished, no acute distress Skin:  Warm and dry Neck:  Midline trachea, normal thyroid, good ROM, no lymphadenopathy Lungs; Clear to auscultation bilaterally Breast:  No dominant palpable mass, retraction, or nipple discharge on the left, on the right has firm 3-4 cm mass at 10-11 0'clock about 4 FB from areola, no retraction or nipple discharge, ha tender area right under arm, ?lymph node Cardiovascular: Regular rate and rhythm Abdomen:  Soft, non tender, no hepatosplenomegaly Pelvic:  External genitalia is normal in appearance, no lesions.  The vagina is normal in appearance. Urethra has no lesions or masses. The cervix is bulbous.+IUD string at os, CV swab obtained.  Uterus is felt to be normal size, shape, and contour.  No adnexal masses or tenderness noted.Bladder is non tender, no  masses felt. Extremities/musculoskeletal:  No swelling or varicosities noted, no clubbing or cyanosis Psych:  No mood changes, alert and cooperative,seems happy AA is 0 Fall risk is low PHQ 9 score is 4 GAD 7 score is 2  [place] - 07/21/20 1544      Pregnancy Intention Screening   Does the patient want to become pregnant in the next year? No    Does the patient's partner want to become pregnant in the next year? No    Would the patient like to discuss contraceptive options today? No      Contraception Wrap Up   Current Method IUD or IUS    End Method IUD or IUS    Contraception Counseling Provided No         Examination chaperoned by Glenard Haring RN  Impression and Plan: 1. Encounter for well woman exam with routine gynecological exam Physical in 1 year Pap in 2024 Labs with PCP  2. IUD (intrauterine device) in place Placed 12/28/15  3. Weight gain Start to decrease portions   4. Mass of upper outer quadrant of right breast Diagnostic right mammogram and [place] scheduled for 08/16/20 at 3:20 pm  5. Screening examination for STD (sexually transmitted disease) CV swab sent  6. FH: breast cancer in first degree relative

## 2020-07-25 ENCOUNTER — Telehealth: Payer: Self-pay | Admitting: *Deleted

## 2020-07-25 ENCOUNTER — Other Ambulatory Visit: Payer: Self-pay | Admitting: Adult Health

## 2020-07-25 LAB — CERVICOVAGINAL ANCILLARY ONLY
Bacterial Vaginitis (gardnerella): POSITIVE — AB
Candida Glabrata: NEGATIVE
Candida Vaginitis: NEGATIVE
Chlamydia: NEGATIVE
Comment: NEGATIVE
Comment: NEGATIVE
Comment: NEGATIVE
Comment: NEGATIVE
Comment: NEGATIVE
Comment: NORMAL
Neisseria Gonorrhea: NEGATIVE
Trichomonas: NEGATIVE

## 2020-07-25 MED ORDER — METRONIDAZOLE 500 MG PO TABS: 500.0000 mg | ORAL_TABLET | Freq: Two times a day (BID) | ORAL | 0 refills | Status: DC

## 2020-07-25 NOTE — Progress Notes (Signed)
+  BV on vaginal swab will rx flagyl 

## 2020-07-25 NOTE — Telephone Encounter (Signed)
Patient called wanting to discuss her recent test results.  Pt only want sot speak with [name].

## 2020-07-26 NOTE — Telephone Encounter (Signed)
Kathy White had questions about BV, answered for her

## 2020-07-29 ENCOUNTER — Telehealth: Payer: Self-pay | Admitting: Adult Health

## 2020-07-29 ENCOUNTER — Telehealth: Payer: Self-pay | Admitting: *Deleted

## 2020-07-29 MED ORDER — CLINDAMYCIN PHOSPHATE 2 % VA CREA: 1.0000 | TOPICAL_CREAM | Freq: Every day | VAGINAL | 0 refills | Status: DC

## 2020-07-29 NOTE — Telephone Encounter (Signed)
Pt states that she started having burning and itching [name] on chest. The only thing new is the flagyl. I advised to not take any more flagyl and take benadryl for the [name]. Advised I would send a message to [name] to see if she could send in an alternative to the flagyl. Flagyl added to allergies.

## 2020-07-29 NOTE — Addendum Note (Signed)
Addended by: Derrek Monaco A on: 07/29/2020 10:02 AM   Modules accepted: Orders

## 2020-07-29 NOTE — Addendum Note (Signed)
Addended by: Janece Canterbury on: 07/29/2020 09:36 AM   Modules accepted: Orders

## 2020-07-29 NOTE — Telephone Encounter (Signed)
Patient states that she was prescribed a medication that has caused her to have an allergic reaction, pt states that it is a burning rash under her breast and arms. Pt states she thought she had posion ivy but it wasn't it. Please contact pt

## 2020-07-29 NOTE — Telephone Encounter (Signed)
Pt aware that clindamycin vaginal cream sent to [facility]

## 2020-07-29 NOTE — Telephone Encounter (Signed)
Pt aware that Clindamycin cream and supp are unavailable. Pt got 3.5 days of Flagyl in before stopping it due to reaction. Maybe pt got enough med to take care of the BV. Pt was advised to let us know how she is on Monday. Pt voiced understanding. Dovray

## 2020-08-01 ENCOUNTER — Encounter: Payer: Self-pay | Admitting: *Deleted

## 2020-08-16 ENCOUNTER — Ambulatory Visit (HOSPITAL_COMMUNITY)
Admission: RE | Admit: 2020-08-16 | Discharge: 2020-08-16 | Disposition: A | Discharge status: Home / Self Care | Payer: Medicaid Other | Source: Ambulatory Visit | Attending: Adult Health | Admitting: Adult Health

## 2020-08-16 ENCOUNTER — Other Ambulatory Visit: Payer: Self-pay | Admitting: Adult Health

## 2020-08-16 ENCOUNTER — Other Ambulatory Visit: Payer: Self-pay

## 2020-08-16 DIAGNOSIS — N6311 Unspecified lump in the right breast, upper outer quadrant: Secondary | ICD-10-CM | POA: Diagnosis present

## 2020-08-16 DIAGNOSIS — Z09 Encounter for follow-up examination after completed treatment for conditions other than malignant neoplasm: Secondary | ICD-10-CM

## 2020-08-16 DIAGNOSIS — R928 Other abnormal and inconclusive findings on diagnostic imaging of breast: Secondary | ICD-10-CM

## 2020-09-13 ENCOUNTER — Other Ambulatory Visit: Payer: Self-pay | Admitting: Adult Health

## 2020-09-13 MED ORDER — FLUCONAZOLE 150 MG PO TABS: ORAL_TABLET | ORAL | 1 refills | Status: DC

## 2020-09-13 NOTE — Progress Notes (Signed)
Rx diflucan.  

## 2020-09-27 ENCOUNTER — Ambulatory Visit (HOSPITAL_COMMUNITY)
Admission: RE | Admit: 2020-09-27 | Discharge: 2020-09-27 | Disposition: A | Discharge status: Home / Self Care | Payer: Medicaid Other | Source: Ambulatory Visit | Attending: Adult Health | Admitting: Adult Health

## 2020-09-27 ENCOUNTER — Other Ambulatory Visit: Payer: Self-pay

## 2020-09-27 DIAGNOSIS — R928 Other abnormal and inconclusive findings on diagnostic imaging of breast: Secondary | ICD-10-CM | POA: Diagnosis present

## 2020-09-27 DIAGNOSIS — Z09 Encounter for follow-up examination after completed treatment for conditions other than malignant neoplasm: Secondary | ICD-10-CM | POA: Insufficient documentation

## 2020-10-10 ENCOUNTER — Other Ambulatory Visit: Payer: Self-pay

## 2020-10-10 ENCOUNTER — Encounter: Payer: Self-pay | Admitting: *Deleted

## 2020-10-10 ENCOUNTER — Other Ambulatory Visit (INDEPENDENT_AMBULATORY_CARE_PROVIDER_SITE_OTHER): Payer: Medicaid Other | Admitting: *Deleted

## 2020-10-10 DIAGNOSIS — R300 Dysuria: Secondary | ICD-10-CM

## 2020-10-10 DIAGNOSIS — R350 Frequency of micturition: Secondary | ICD-10-CM

## 2020-10-10 LAB — POCT URINALYSIS DIPSTICK
Glucose, UA: NEGATIVE
Ketones, UA: NEGATIVE
Nitrite, UA: NEGATIVE
Protein, UA: NEGATIVE

## 2020-10-10 MED ORDER — NITROFURANTOIN MONOHYD MACRO 100 MG PO CAPS: 100.0000 mg | ORAL_CAPSULE | Freq: Two times a day (BID) | ORAL | 0 refills | Status: DC

## 2020-10-10 NOTE — Progress Notes (Addendum)
   NURSE VISIT- UTI SYMPTOMS   SUBJECTIVE:  Kathy White is a 40 y.o. G69P0101 female here for UTI symptoms. She is a GYN patient. She reports  burning with urination and urinary frequency .  OBJECTIVE:  There were no vitals taken for this visit.  Appears well, in no apparent distress  Results for orders placed or performed in visit on 10/10/20 (from the past 24 hour(s))  POCT Urinalysis Dipstick   Collection Time: 10/10/20  4:34 PM  Result Value Ref Range   Color, UA     Clarity, UA     Glucose, UA Negative Negative   Bilirubin, UA     Ketones, UA neg    Spec Grav, UA     Blood, UA trace    pH, UA     Protein, UA Negative Negative   Urobilinogen, UA     Nitrite, UA neg    Leukocytes, UA Large (3+) (A) Negative   Appearance     Odor      ASSESSMENT: GYN patient with UTI symptoms and negative nitrites  PLAN: Discussed with Knute Neu, CNM, [facility]   Rx sent by provider today: Yes Urine culture sent Call or return to clinic prn if these symptoms worsen or fail to improve as anticipated. Follow-up: as needed   Levy Pupa  10/10/2020 4:58 PM  Chart reviewed for nurse visit. Agree with plan of care. Rx macrobid. Roma Schanz, [place] 10/10/2020 5:01 PM

## 2020-10-10 NOTE — Addendum Note (Signed)
Addended by: Roma Schanz on: 10/10/2020 05:01 PM   Modules accepted: Orders

## 2020-10-13 ENCOUNTER — Other Ambulatory Visit: Payer: Self-pay | Admitting: Women's Health

## 2020-10-13 LAB — URINE CULTURE

## 2020-10-13 MED ORDER — FLUCONAZOLE 150 MG PO TABS: 150.0000 mg | ORAL_TABLET | Freq: Once | ORAL | 0 refills | Status: AC

## 2021-01-05 DIAGNOSIS — G5601 Carpal tunnel syndrome, right upper limb: Secondary | ICD-10-CM | POA: Insufficient documentation

## 2021-01-05 DIAGNOSIS — R200 Anesthesia of skin: Secondary | ICD-10-CM | POA: Insufficient documentation

## 2021-01-11 DIAGNOSIS — R200 Anesthesia of skin: Secondary | ICD-10-CM | POA: Insufficient documentation

## 2021-01-18 ENCOUNTER — Other Ambulatory Visit: Payer: Self-pay

## 2021-01-18 ENCOUNTER — Encounter (INDEPENDENT_AMBULATORY_CARE_PROVIDER_SITE_OTHER): Payer: Medicaid Other | Admitting: Women's Health

## 2021-01-19 NOTE — Progress Notes (Signed)
Pt scheduled too early for IUD removal. Appt cancelled. This encounter was created in error - please disregard.

## 2021-03-30 ENCOUNTER — Encounter: Payer: Self-pay | Admitting: *Deleted

## 2021-04-03 ENCOUNTER — Ambulatory Visit: Payer: Medicaid Other | Admitting: Diagnostic Neuroimaging

## 2021-04-03 ENCOUNTER — Encounter: Payer: Self-pay | Admitting: Diagnostic Neuroimaging

## 2021-04-03 VITALS — BP 126/89 | HR 72 | Ht 63.0 in | Wt 206.0 lb

## 2021-04-03 DIAGNOSIS — R200 Anesthesia of skin: Secondary | ICD-10-CM
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
## 2021-04-05 ENCOUNTER — Telehealth: Payer: Self-pay | Admitting: Diagnostic Neuroimaging

## 2021-04-05 NOTE — Telephone Encounter (Signed)
[facility] pending faxed notes  

## 2021-04-05 NOTE — Telephone Encounter (Signed)
[facility] Kathy White: [id number] (exp. 04/05/21 to 05/05/21) order sent to GI, they will reach out to the patient to schedule.

## 2021-04-17 ENCOUNTER — Other Ambulatory Visit: Payer: Self-pay

## 2021-04-17 ENCOUNTER — Ambulatory Visit
Admission: RE | Admit: 2021-04-17 | Discharge: 2021-04-17 | Disposition: A | Discharge status: Home / Self Care | Payer: Medicaid Other | Source: Ambulatory Visit | Attending: Diagnostic Neuroimaging | Admitting: Diagnostic Neuroimaging

## 2021-04-17 DIAGNOSIS — R200 Anesthesia of skin: Secondary | ICD-10-CM | POA: Diagnosis not present

## 2021-04-17 MED ORDER — GADOBENATE DIMEGLUMINE 529 MG/ML IV SOLN
19.0000 mL | Freq: Once | INTRAVENOUS | Status: AC | PRN
  Administered 2021-04-17: 19 mL via INTRAVENOUS

## 2021-05-11 ENCOUNTER — Telehealth: Payer: Self-pay | Admitting: Adult Health

## 2021-05-11 NOTE — Telephone Encounter (Signed)
Patient calling stating that she has had her IUD for a while. And she normally she has her periods begin of the month but she has had her period for the whole month of February , she wants to know if this is normal and if so when will the periods go back to nornal, she has about another 2 years for the mirena to be replaced with the new guide lines....   ?

## 2021-05-11 NOTE — Telephone Encounter (Signed)
Spotting x 1 month, usually has period every month, has IUD, can feel strings, will make appt to have exam  ?

## 2021-05-24 ENCOUNTER — Ambulatory Visit: Payer: Medicaid Other | Admitting: Women's Health

## 2021-05-24 ENCOUNTER — Other Ambulatory Visit: Payer: Self-pay

## 2021-05-24 ENCOUNTER — Encounter: Payer: Self-pay | Admitting: Women's Health

## 2021-05-24 VITALS — BP 125/88 | HR 73 | Ht 63.0 in | Wt 209.0 lb

## 2021-05-24 DIAGNOSIS — N949 Unspecified condition associated with female genital organs and menstrual cycle: Secondary | ICD-10-CM

## 2021-05-24 DIAGNOSIS — N926 Irregular menstruation, unspecified: Secondary | ICD-10-CM | POA: Diagnosis not present

## 2021-05-24 NOTE — Progress Notes (Signed)
? ?  GYN VISIT ?Patient name: Kathy White MRN [id number]  Date of birth: 02/17/1981 ?Chief Complaint:   ?Vaginal Pain (Burning, itching, abnormal bleeding) ? ?History of Present Illness:   ?Kathy White is a 41 y.o. G35P0101 female being seen today for report of irregular bleeding since Feb, bled every day in Feb, not heavy but enough to wear a pad. Vaginal burning, was itching, not really now. Unsure about odor b/c her urine smells like her vitamins.     ?Patient's last menstrual period was 04/13/2021 (approximate). ?The current method of family planning is IUD. Mirena inserted 12/28/15 ?Last pap 07/21/19. Results were: NILM w/ HRHPV negative ? ?Depression screen [facility] 2/9 07/21/2020 07/21/2019 02/25/2018  ?Decreased Interest 0 0 0  ?Down, Depressed, Hopeless 0 0 0  ?PHQ - 2 Score 0 0 0  ?Altered sleeping 1 - -  ?Tired, decreased energy 1 - -  ?Change in appetite 2 - -  ?Feeling bad or failure about yourself  0 - -  ?Trouble concentrating 0 - -  ?Moving slowly or fidgety/restless 0 - -  ?Suicidal thoughts 0 - -  ?PHQ-9 Score 4 - -  ? ?  ?GAD 7 : Generalized Anxiety Score 07/21/2020  ?Nervous, Anxious, on Edge 0  ?Control/stop worrying 0  ?Worry too much - different things 1  ?Trouble relaxing 1  ?Restless 0  ?Easily annoyed or irritable 0  ?Afraid - awful might happen 0  ?Total GAD 7 Score 2  ? ? ? ?Review of Systems:   ?Pertinent items are noted in HPI ?Denies fever/chills, dizziness, headaches, visual disturbances, fatigue, shortness of breath, chest pain, abdominal pain, vomiting, abnormal vaginal discharge/itching/odor/irritation, problems with periods, bowel movements, urination, or intercourse unless otherwise stated above.  ?Pertinent History Reviewed:  ?Reviewed past medical,surgical, social, obstetrical and family history.  ?Reviewed problem list, medications and allergies. ?Physical Assessment:  ? ?Vitals:  ? 05/24/21 0909  ?BP: 125/88  ?Pulse: 73  ?Weight: 209 lb (94.8 kg)  ?Height: '5\' 3"'$  (1.6 m)  ?Body  mass index is 37.02 kg/m?. ? ?     Physical Examination:  ? General appearance: alert, well appearing, and in no distress ? Mental status: alert, oriented to person, place, and time ? Skin: warm & dry  ? Cardiovascular: normal heart rate noted ? Respiratory: normal respiratory effort, no distress ? Abdomen: soft, non-tender  ? Pelvic: VULVA: normal appearing vulva with no masses, tenderness or lesions, VAGINA: normal appearing vagina with normal color and discharge, no lesions, small amt menstrual type blood CERVIX: normal appearing cervix without discharge or lesions, IUD strings visible ? Extremities: no edema  ? ?Chaperone: Glenard Haring [place]   ? ?No results found for this or any previous visit (from the past 24 hour(s)).  ?Assessment & Plan:  ?1) Irregular periods with vaginal burning> CV swab sent ? ?Meds: No orders of the defined types were placed in this encounter. ? ? ?No orders of the defined types were placed in this encounter. ? ? ?Return in about 1 year (around 05/25/2022) for Pap & physical. ? ?Roma Schanz CNM, WHNP-BC ?05/24/2021 ?9:39 AM  ?

## 2021-05-25 ENCOUNTER — Encounter: Payer: Self-pay | Admitting: Women's Health

## 2021-05-25 LAB — CERVICOVAGINAL ANCILLARY ONLY
Bacterial Vaginitis (gardnerella): POSITIVE — AB
Candida Glabrata: NEGATIVE
Candida Vaginitis: NEGATIVE
Chlamydia: NEGATIVE
Comment: NEGATIVE
Comment: NEGATIVE
Comment: NEGATIVE
Comment: NEGATIVE
Comment: NEGATIVE
Comment: NORMAL
Neisseria Gonorrhea: NEGATIVE
Trichomonas: NEGATIVE

## 2021-05-25 MED ORDER — CLINDAMYCIN PHOSPHATE 2 % VA GEL: 1.0000 | Freq: Every day | VAGINAL | 0 refills | Status: DC

## 2021-05-25 NOTE — Addendum Note (Signed)
Addended by: Wells Guiles R on: 05/25/2021 01:57 PM ? ? Modules accepted: Orders ? ?

## 2021-05-26 ENCOUNTER — Other Ambulatory Visit: Payer: Self-pay | Admitting: Women's Health

## 2021-05-26 ENCOUNTER — Telehealth: Payer: Self-pay | Admitting: Women's Health

## 2021-05-26 MED ORDER — METRONIDAZOLE 500 MG PO TABS: 500.0000 mg | ORAL_TABLET | Freq: Two times a day (BID) | ORAL | 0 refills | Status: DC

## 2021-05-26 NOTE — Telephone Encounter (Signed)
Patient would like a pill form of the medication she needs. She states she doesn't want to use cream on her vagina. Please advise.  ?

## 2021-06-06 DIAGNOSIS — Z4789 Encounter for other orthopedic aftercare: Secondary | ICD-10-CM | POA: Insufficient documentation

## 2021-07-17 DIAGNOSIS — S83289A Other tear of lateral meniscus, current injury, unspecified knee, initial encounter: Secondary | ICD-10-CM | POA: Insufficient documentation

## 2021-07-18 ENCOUNTER — Encounter: Payer: Self-pay | Admitting: Women's Health

## 2021-07-19 ENCOUNTER — Other Ambulatory Visit (INDEPENDENT_AMBULATORY_CARE_PROVIDER_SITE_OTHER): Payer: Medicaid Other

## 2021-07-19 ENCOUNTER — Other Ambulatory Visit (HOSPITAL_COMMUNITY)
Admission: RE | Admit: 2021-07-19 | Discharge: 2021-07-19 | Disposition: A | Discharge status: Home / Self Care | Payer: Medicaid Other | Source: Ambulatory Visit | Attending: Obstetrics & Gynecology | Admitting: Obstetrics & Gynecology

## 2021-07-19 DIAGNOSIS — N939 Abnormal uterine and vaginal bleeding, unspecified: Secondary | ICD-10-CM

## 2021-07-19 NOTE — Progress Notes (Signed)
? ?  NURSE VISIT- VAGINITIS/STD ? ?SUBJECTIVE:  ?Kathy White is a 41 y.o. G1P0101 GYN patientfemale here for a vaginal swab for vaginitis screening, STD screen.  She reports the following symptoms: abnormal bleeding: mild bleeding with wiping, burning, odor, and vulvar itching for 4 days. ?Denies abnormal vaginal bleeding, significant pelvic pain, fever, or UTI symptoms. ? ?OBJECTIVE:  ?There were no vitals taken for this visit.  ?Appears well, in no apparent distress ? ?ASSESSMENT: ?Vaginal swab for  vaginitis & STD screening ? ?PLAN: ?Self-collected vaginal probe for Gonorrhea, Chlamydia, Trichomonas, Bacterial Vaginosis, Yeast sent to lab ?Treatment: to be determined once results are received ?Follow-up as needed if symptoms persist/worsen, or new symptoms develop ? ?Kathy White  ?07/19/2021 ?4:05 PM  ?

## 2021-07-21 LAB — CERVICOVAGINAL ANCILLARY ONLY
Bacterial Vaginitis (gardnerella): NEGATIVE
Candida Glabrata: NEGATIVE
Candida Vaginitis: NEGATIVE
Chlamydia: NEGATIVE
Comment: NEGATIVE
Comment: NEGATIVE
Comment: NEGATIVE
Comment: NEGATIVE
Comment: NEGATIVE
Comment: NORMAL
Neisseria Gonorrhea: NEGATIVE
Trichomonas: NEGATIVE

## 2021-08-03 DIAGNOSIS — M25562 Pain in left knee: Secondary | ICD-10-CM | POA: Insufficient documentation

## 2021-08-28 DIAGNOSIS — S83249A Other tear of medial meniscus, current injury, unspecified knee, initial encounter: Secondary | ICD-10-CM | POA: Insufficient documentation

## 2021-09-09 HISTORY — PX: KNEE SURGERY: SHX244

## 2021-09-15 ENCOUNTER — Telehealth: Payer: Self-pay

## 2021-09-15 DIAGNOSIS — Z803 Family history of malignant neoplasm of breast: Secondary | ICD-10-CM

## 2021-09-15 DIAGNOSIS — N6001 Solitary cyst of right breast: Secondary | ICD-10-CM

## 2021-09-15 NOTE — Telephone Encounter (Signed)
Pt needs a referral for a bilateral right breast mammo and u/s pt states they placed It in her mychart

## 2021-09-18 NOTE — Addendum Note (Signed)
Addended by: Derrek Monaco A on: 09/18/2021 01:44 PM   Modules accepted: Orders

## 2021-09-18 NOTE — Telephone Encounter (Signed)
Pt aware orders in

## 2021-09-19 ENCOUNTER — Other Ambulatory Visit (HOSPITAL_COMMUNITY): Payer: Self-pay | Admitting: Adult Health

## 2021-09-19 DIAGNOSIS — N6011 Diffuse cystic mastopathy of right breast: Secondary | ICD-10-CM

## 2021-09-23 HISTORY — PX: KNEE ARTHROSCOPY W/ PARTIAL MEDIAL MENISCECTOMY: SHX1882

## 2021-09-25 DIAGNOSIS — M25662 Stiffness of left knee, not elsewhere classified: Secondary | ICD-10-CM | POA: Insufficient documentation

## 2021-09-25 HISTORY — DX: Stiffness of left knee, not elsewhere classified: M25.662

## 2021-10-10 ENCOUNTER — Ambulatory Visit (HOSPITAL_COMMUNITY)
Admission: RE | Admit: 2021-10-10 | Discharge: 2021-10-10 | Disposition: A | Discharge status: Home / Self Care | Payer: Medicaid Other | Source: Ambulatory Visit | Attending: Adult Health | Admitting: Adult Health

## 2021-10-10 DIAGNOSIS — Z803 Family history of malignant neoplasm of breast: Secondary | ICD-10-CM

## 2021-10-10 DIAGNOSIS — N6001 Solitary cyst of right breast: Secondary | ICD-10-CM | POA: Insufficient documentation

## 2021-10-10 DIAGNOSIS — N6011 Diffuse cystic mastopathy of right breast: Secondary | ICD-10-CM | POA: Insufficient documentation

## 2021-10-23 ENCOUNTER — Ambulatory Visit (INDEPENDENT_AMBULATORY_CARE_PROVIDER_SITE_OTHER): Payer: Medicaid Other | Admitting: Adult Health

## 2021-10-23 ENCOUNTER — Encounter: Payer: Self-pay | Admitting: Adult Health

## 2021-10-23 VITALS — BP 122/70 | HR 67 | Ht 63.0 in | Wt 216.5 lb

## 2021-10-23 DIAGNOSIS — Z803 Family history of malignant neoplasm of breast: Secondary | ICD-10-CM | POA: Diagnosis not present

## 2021-10-23 DIAGNOSIS — Z0000 Encounter for general adult medical examination without abnormal findings: Secondary | ICD-10-CM | POA: Diagnosis not present

## 2021-10-23 DIAGNOSIS — Z975 Presence of (intrauterine) contraceptive device: Secondary | ICD-10-CM | POA: Diagnosis not present

## 2021-10-23 DIAGNOSIS — Z01419 Encounter for gynecological examination (general) (routine) without abnormal findings: Secondary | ICD-10-CM

## 2021-10-23 NOTE — Progress Notes (Signed)
Patient ID: Kathy White, female   DOB: 1980-03-24, 41 y.o.   MRN: [id number] History of Present Illness: Kathy White is a 41 year old white female, single, G1P0101, in for a well woman gyn exam. Lab Results  Component Value Date   DIAGPAP  07/21/2019    - Negative for intraepithelial lesion or malignancy (NILM)   [place] Negative 07/21/2019   PCP is Dr Darron Doom.    Current Medications, Allergies, Past Medical History, Past Surgical History, Family History and Social History were reviewed in [employer] record.     Review of Systems: Patient denies any headaches, hearing loss, fatigue, blurred vision, shortness of breath, chest pain, abdominal pain, problems with bowel movements, urination, or intercourse.(Not currently active) No joint pain or mood swings.     Physical Exam:BP 122/70 (BP Location: Left Arm, Patient Position: Sitting, Cuff Size: Normal)   Pulse 67   Ht '5\' 3"'$  (1.6 m)   Wt 216 lb 8 oz (98.2 kg)   LMP 10/10/2021   BMI 38.35 kg/m   General:  Well developed, well nourished, no acute distress Skin:  Warm and dry Neck:  Midline trachea, normal thyroid, good ROM, no lymphadenopathy Lungs; Clear to auscultation bilaterally Breast:  No dominant palpable mass, retraction, or nipple discharge Cardiovascular: Regular rate and rhythm Abdomen:  Soft, non tender, no hepatosplenomegaly Pelvic:  External genitalia is normal in appearance, no lesions.  The vagina is normal in appearance. Urethra has no lesions or masses. The cervix is bulbous.+IUD strings at os.  Uterus is felt to be normal size, shape, and contour.  No adnexal masses or tenderness noted.Bladder is non tender, no masses felt. Extremities/musculoskeletal:  No swelling + varicosities noted, no clubbing or cyanosis Psych:  No mood changes, alert and cooperative,seems happy AA is 0 Fall risk is moderate    10/23/2021    1:54 PM 07/21/2020    3:43 PM 07/21/2019   11:35 AM  Depression screen PHQ 2/9   Decreased Interest 0 0 0  Down, Depressed, Hopeless 0 0 0  PHQ - 2 Score 0 0 0  Altered sleeping 2 1   Tired, decreased energy 1 1   Change in appetite 1 2   Feeling bad or failure about yourself  0 0   Trouble concentrating 0 0   Moving slowly or fidgety/restless 0 0   Suicidal thoughts 0 0   PHQ-9 Score 4 4        10/23/2021    1:55 PM 07/21/2020    3:44 PM  GAD 7 : Generalized Anxiety Score  Nervous, Anxious, on Edge 1 0  Control/stop worrying 1 0  Worry too much - different things 1 1  Trouble relaxing 1 1  Restless 1 0  Easily annoyed or irritable 1 0  Afraid - awful might happen 1 0  Total GAD 7 Score 7 2  Has vistaril  Upstream - 10/23/21 1351       Pregnancy Intention Screening   Does the patient want to become pregnant in the next year? No    Does the patient's partner want to become pregnant in the next year? No    Would the patient like to discuss contraceptive options today? No      Contraception Wrap Up   Current Method IUD or IUS    End Method IUD or IUS            Examination chaperoned by Levy Pupa LPN    Impression and Plan:  1. Encounter for well woman exam with routine gynecological exam Pap and physical in 1 year  2. IUD (intrauterine device) in place Mirena placed 12/28/15.  3. FH: breast cancer in first degree relative She had mammogram 10/10/21 and has to follow up on 6 months to assess cysts She as BR CA 1 negative, but mom was + Will get MR of breast with next mammogram

## 2021-11-09 ENCOUNTER — Other Ambulatory Visit: Payer: Self-pay

## 2021-11-09 ENCOUNTER — Other Ambulatory Visit (INDEPENDENT_AMBULATORY_CARE_PROVIDER_SITE_OTHER): Payer: Medicaid Other

## 2021-11-09 ENCOUNTER — Other Ambulatory Visit: Payer: Self-pay | Admitting: Adult Health

## 2021-11-09 ENCOUNTER — Encounter (HOSPITAL_COMMUNITY): Payer: Self-pay

## 2021-11-09 ENCOUNTER — Emergency Department (HOSPITAL_COMMUNITY)
Admission: EM | Admit: 2021-11-09 | Discharge: 2021-11-09 | Disposition: A | Discharge status: Home / Self Care | Payer: Medicaid Other | Attending: Emergency Medicine | Admitting: Emergency Medicine

## 2021-11-09 DIAGNOSIS — E876 Hypokalemia: Secondary | ICD-10-CM | POA: Insufficient documentation

## 2021-11-09 DIAGNOSIS — R300 Dysuria: Secondary | ICD-10-CM | POA: Diagnosis not present

## 2021-11-09 DIAGNOSIS — N3000 Acute cystitis without hematuria: Secondary | ICD-10-CM | POA: Insufficient documentation

## 2021-11-09 DIAGNOSIS — Z7982 Long term (current) use of aspirin: Secondary | ICD-10-CM | POA: Insufficient documentation

## 2021-11-09 DIAGNOSIS — R1031 Right lower quadrant pain: Secondary | ICD-10-CM

## 2021-11-09 DIAGNOSIS — R3915 Urgency of urination: Secondary | ICD-10-CM | POA: Diagnosis not present

## 2021-11-09 LAB — URINALYSIS, ROUTINE W REFLEX MICROSCOPIC
Bilirubin Urine: NEGATIVE
Glucose, UA: NEGATIVE mg/dL
Ketones, ur: NEGATIVE mg/dL
Nitrite: POSITIVE — AB
Protein, ur: NEGATIVE mg/dL
Specific Gravity, Urine: 1.005 (ref 1.005–1.030)
pH: 7 (ref 5.0–8.0)

## 2021-11-09 LAB — COMPREHENSIVE METABOLIC PANEL
ALT: 21 U/L (ref 0–44)
AST: 17 U/L (ref 15–41)
Albumin: 4.3 g/dL (ref 3.5–5.0)
Alkaline Phosphatase: 57 U/L (ref 38–126)
Anion gap: 8 (ref 5–15)
BUN: 13 mg/dL (ref 6–20)
CO2: 27 mmol/L (ref 22–32)
Calcium: 8.9 mg/dL (ref 8.9–10.3)
Chloride: 102 mmol/L (ref 98–111)
Creatinine, Ser: 0.72 mg/dL (ref 0.44–1.00)
GFR, Estimated: >60 mL/min (ref >60)
Glucose, Bld: 101 mg/dL — ABNORMAL HIGH (ref 70–99)
Potassium: 3.3 mmol/L — ABNORMAL LOW (ref 3.5–5.1)
Sodium: 137 mmol/L (ref 135–145)
Total Bilirubin: 0.8 mg/dL (ref 0.3–1.2)
Total Protein: 7.1 g/dL (ref 6.5–8.1)

## 2021-11-09 LAB — CBC
HCT: 39.9 % (ref 36.0–46.0)
Hemoglobin: 13 g/dL (ref 12.0–15.0)
MCH: 28.9 pg (ref 26.0–34.0)
MCHC: 32.6 g/dL (ref 30.0–36.0)
MCV: 88.7 fL (ref 80.0–100.0)
Platelets: 304 10*3/uL (ref 150–400)
RBC: 4.5 MIL/uL (ref 3.87–5.11)
RDW: 13.4 % (ref 11.5–15.5)
WBC: 9.4 10*3/uL (ref 4.0–10.5)
nRBC: 0 % (ref 0.0–0.2)

## 2021-11-09 LAB — POC URINE PREG, ED: Preg Test, Ur: NEGATIVE

## 2021-11-09 LAB — LIPASE, BLOOD: Lipase: 37 U/L (ref 11–51)

## 2021-11-09 MED ORDER — NITROFURANTOIN MONOHYD MACRO 100 MG PO CAPS: 100.0000 mg | ORAL_CAPSULE | Freq: Two times a day (BID) | ORAL | 0 refills | Status: DC

## 2021-11-09 MED ORDER — POTASSIUM CHLORIDE 20 MEQ PO PACK
40.0000 meq | PACK | Freq: Once | ORAL | Status: AC
  Administered 2021-11-09: 40 meq via ORAL
  Filled 2021-11-09: qty 2

## 2021-11-09 NOTE — Progress Notes (Signed)
   NURSE VISIT- UTI SYMPTOMS   SUBJECTIVE:  Kathy White is a 41 y.o. G17P0101 female here for UTI symptoms. She is a GYN patient. She reports dysuria, urinary frequency, and urinary hesitancy. Starting taking AZO last night as "she couldn't stand it any longer"  OBJECTIVE:  LMP 10/10/2021   Appears well, in no apparent distress  No results found for this or any previous visit (from the past 24 hour(s)).  ASSESSMENT: Pregnancy Unknown with UTI symptoms. Pt taking AZO so unable to dip urine  PLAN: Note routed to Derrek Monaco, AGNP   Rx sent by provider today: pt requesting antibiotic be sent Urine culture sent Call or return to clinic prn if these symptoms worsen or fail to improve as anticipated. Follow-up: as needed   Kathy White  11/09/2021 11:03 AM

## 2021-11-09 NOTE — ED Provider Notes (Signed)
[facility] EMERGENCY DEPARTMENT Provider Note   CSN: [id number] Arrival date & time: 11/09/21  1105     History  Chief Complaint  Patient presents with   Abdominal Pain    Kathy White is a 41 y.o. female with a PMH of, previous C-section, previous presents with concern for sharp right lower quadrant pain happening with diarrhea bowel movements on and off for the last several years.  Patient reports that over the last month it has worsened and she had 2 severe attacks with constant pain followed by loose stools, that come back to back every 30 to 45 minutes.  Patient reports that this last episode started last night and continued to this morning.  She reports some rectal soreness, but no blood in stool.  She has been seen evaluated by GI doctor as well as her PCP in the past, she takes Protonix, and has Linzess for constipation episodes of IBS, reports that she has not tried Imodium for diarrhea.  At time my evaluation she reports no significant pain.  She denies urinary frequency, vaginal discharge, dysuria, and reports that her primary care doctor is already aware of the fact that she has a UTI and prescribed her Macrobid which she has not begun taking.  She denies fever, chills, nausea, vomiting.   Abdominal Pain      Home Medications Prior to Admission medications   Medication Sig Start Date End Date Taking? Authorizing Provider  aspirin EC 81 MG tablet Take 81 mg by mouth daily. Swallow whole.    [provider]  budesonide-formoterol (SYMBICORT) 160-4.5 MCG/ACT inhaler 2 puffs    [provider]  calcium carbonate (OSCAL) 1500 (600 Ca) MG TABS tablet Take by mouth 2 (two) times daily with a meal.    [provider]  carvedilol (COREG) 3.125 MG tablet Take 3.125 mg by mouth 2 (two) times daily. 10/11/21   [provider]  cetirizine (ZYRTEC) 10 MG tablet Take by mouth.    [provider]  cholecalciferol (VITAMIN D) 1000 units tablet  Take 4,000 Units by mouth daily.    [provider]  cyclobenzaprine (FLEXERIL) 10 MG tablet cyclobenzaprine 10 mg tablet    [provider]  EPINEPHrine 0.3 mg/0.3 mL IJ SOAJ injection epinephrine 0.3 mg/0.3 mL injection, auto-injector    [provider]  fluticasone (FLONASE) 50 MCG/ACT nasal spray Place 2 sprays into the nose as needed for allergies.  07/20/13   [provider]  hydrochlorothiazide (HYDRODIURIL) 25 MG tablet Take 1 tablet by mouth daily. 06/14/20   [provider]  hydrOXYzine (ATARAX/VISTARIL) 10 MG tablet hydroxyzine HCl 10 mg tablet    [provider]  ipratropium (ATROVENT) 0.06 % nasal spray 2 sprays in each nostril    [provider]  levocetirizine (XYZAL) 5 MG tablet Take 5 mg by mouth at bedtime. 02/08/20   [provider]  levonorgestrel (MIRENA) 20 MCG/24HR IUD 1 each by Intrauterine route once.    [provider]  linaclotide (LINZESS) 145 MCG CAPS capsule Take 145 mcg by mouth.    [provider]  losartan (COZAAR) 100 MG tablet Take 1 tablet by mouth daily. 07/05/20   [provider]  meloxicam (MOBIC) 7.5 MG tablet meloxicam 7.5 mg tablet    [provider]  montelukast (SINGULAIR) 10 MG tablet Take 1 tablet by mouth daily. 06/13/20   [provider]  Multiple Vitamins-Calcium (ONE-A-DAY WOMENS PO) Take by mouth daily.    [provider]  nitrofurantoin, macrocrystal-monohydrate, (MACROBID) 100 MG capsule Take 1 capsule (100 mg total) by mouth 2 (two) times daily. 11/09/21   Estill Dooms, NP  omeprazole (PRILOSEC) 40 MG capsule Take one night before surgery (between 9 and 11pm) than once daily 08/28/17   [provider]  Phentermine HCl (LOMAIRA) 8 MG TABS Lomaira 8 mg tablet  TAKE 1 TABLET BY MOUTH 30 MINUTES BEFORE BREAKFAST AND 1 TABLET AT 3 PM DAILY    [provider]  Spacer/Aero-Holding Chambers (AEROCHAMBER MV)  inhaler See admin instructions.    [provider]      Allergies    Bee venom, Other, Hydrocodone, Oxycodone-acetaminophen, Amlodipine, and Percocet [oxycodone-acetaminophen]    Review of Systems   Review of Systems  Gastrointestinal:  Positive for abdominal pain.  All other systems reviewed and are negative.   Physical Exam Updated Vital Signs BP (!) 152/101 (BP Location: Right Arm)   Pulse 73   Temp 98.1 F (36.7 C) (Oral)   Resp 18   Ht '5\' 3"'$  (1.6 m)   Wt 98 kg   LMP 10/10/2021   SpO2 100%   BMI 38.26 kg/m  Physical Exam Vitals and nursing note reviewed.  Constitutional:      General: She is not in acute distress.    Appearance: Normal appearance.  HENT:     Head: Normocephalic and atraumatic.  Eyes:     General:        Right eye: No discharge.        Left eye: No discharge.  Cardiovascular:     Rate and Rhythm: Normal rate and regular rhythm.     Heart sounds: No murmur heard.    No friction rub. No gallop.  Pulmonary:     Effort: Pulmonary effort is normal.     Breath sounds: Normal breath sounds.  Abdominal:     General: Bowel sounds are normal.     Palpations: Abdomen is soft.     Comments: Patient with overall benign abdominal exam, she has some feeling of pressure over palpation suprapubically, and no significant right lower quadrant, left lower quadrant, or other abdominal tenderness.  No rebound, rigidity, guarding.  Normal bowel sounds throughout.  Skin:    General: Skin is warm and dry.     Capillary Refill: Capillary refill takes less than 2 seconds.  Neurological:     Mental Status: She is alert and oriented to person, place, and time.  Psychiatric:        Mood and Affect: Mood normal.        Behavior: Behavior normal.     ED Results / Procedures / Treatments   Labs (all labs ordered are listed, but only abnormal results are displayed) Labs Reviewed  COMPREHENSIVE METABOLIC PANEL - Abnormal; Notable for the following components:       Result Value   Potassium 3.3 (*)    Glucose, Bld 101 (*)    All other components within normal limits  URINALYSIS, ROUTINE W REFLEX MICROSCOPIC - Abnormal; Notable for the following components:   Color, Urine AMBER (*)    Hgb urine dipstick LARGE (*)    Nitrite POSITIVE (*)    Leukocytes,Ua SMALL (*)    Bacteria, UA RARE (*)    All other components within normal limits  LIPASE, BLOOD  CBC  POC URINE PREG, ED    EKG None  Radiology No results found.  Procedures Procedures    Medications Ordered in ED Medications  potassium  chloride (KLOR-CON) packet 40 mEq (40 mEq Oral Given 11/09/21 1403)    ED Course/ Medical Decision Making/ A&P                           Medical Decision Making Amount and/or Complexity of Data Reviewed Labs: ordered.   This patient is a 41 y.o. female who presents to the ED for concern of crampy right lower quadrant and lower abdominal pain, high output diarrhea, this involves an extensive number of treatment options, and is a complaint that carries with it a high risk of complications and morbidity. The emergent differential diagnosis prior to evaluation includes, but is not limited to, infectious diarrhea, gastroenteritis, IBS-D, urinary tract infection, pyelonephritis, nephrolithiasis, appendicitis, diverticulitis, colitis versus other.   This is not an exhaustive differential.   Past Medical History / Co-morbidities / Social History: Obesity, history of migraines, known history of IBS, previous C-section and cholecystectomy without previous appendectomy  Additional history: Chart reviewed. Pertinent results include: Reviewed outpatient OB/GYN visits, previous PCP and GI visits including lab work, imaging  Physical Exam: Physical exam performed. The pertinent findings include: Patient is overall well-appearing on my exam with no tenderness palpation of the abdomen.  She has no rebound, rigidity, guarding.  She does endorse some fullness  with palpation suprapubically.  She is stable vital signs, afebrile, she is minimally hypertensive with systolic 631 over diastolic 497 on my evaluation.  Lab Tests: I ordered, and personally interpreted labs.  The pertinent results include: CMP overall unremarkable other than hypokalemia, calcium 3.3 we will orally replete, this is likely due to her high volume output diarrhea.  Urinalysis is suggestive of an acute cystitis with large hemoglobin, nitrite positive, small leukocytes and bacteria.  She was already seen and diagnosed with a UTI and has not begun treatment yet.  CBC shows no leukocytosis, or clinically significant anemia.  Pregnancy test negative and lipase is unremarkable   Medications: I ordered medication including potassium chloride for hypokalemia. Patient in no distress at time of my evaluation. We updated her home medications in the ED.   Disposition: After consideration of the diagnostic results and the patients response to treatment, I feel that patient would benefit from dicyclomine, Imodium, and encouraged gentle versus low FODMAP diet as well as follow-up with GI.  Considered advanced imaging today due to location of pain being right lower quadrant, but patient has no leukocytosis, and no tenderness on my exam, do not think that advanced imaging to rule out appendicitis or other intra-abdominal abnormality is necessary at this time.  Patient understands agrees with this plan.  Patient declined dicyclomine due to vague reaction of feeling numb on her face when previously taking it although she reports that she had taken it 1 other time with no significant reaction.  Discussed that it is important that she follow-up with GI.   emergency department workup does not suggest an emergent condition requiring admission or immediate intervention beyond what has been performed at this time. The plan is: Imodium, gentle diet,/low FODMAP diet, GI follow-up. The patient is safe for discharge  and has been instructed to return immediately for worsening symptoms, change in symptoms or any other concerns.  I discussed this case with my attending physician Dr. Langston Masker who cosigned this note including patient's presenting symptoms, physical exam, and planned diagnostics and interventions. Attending physician stated agreement with plan or made changes to plan which were implemented.    Final Clinical Impression(s) /  ED Diagnoses Final diagnoses:  Right lower quadrant abdominal pain  Acute cystitis without hematuria    Rx / DC Orders ED Discharge Orders     None         Anselmo Pickler, PA-C 11/09/21 1501    Wyvonnia Dusky, MD 11/09/21 [phone]

## 2021-11-09 NOTE — Progress Notes (Signed)
Will rx Macrobid for UTI symptoms

## 2021-11-09 NOTE — Discharge Instructions (Addendum)
Please take the entire course of antibiotics that you are prescribed to treat your urinary tract infection as your urine today did again demonstrate a urinary tract infection.  Based on your presentation today I do think that you had an episode of your IBS-D.  In the short-term I would recommend taking some Imodium which helps for bad diarrhea episodes and can decrease the amount of output that you are having which can also improve the cramping that you experience.  It may be worth retrialing the dicyclomine medication under supervision with your doctor in the future but I will refrain from prescribing this today based on the reaction that you state that you have had to this medication.  The only other additional recommendation that would make is to try a low FODMAP/low residue diet and slowly reintroduce foods over the recommended duration to see if you can identify triggers for these episodes.  Please try to get back in contact with your gastroenterologist for further evaluation.

## 2021-11-09 NOTE — ED Triage Notes (Signed)
Pt to er, pt states that she is here for RLQ pain, states that she had seen her pmd and also a gi doc and they say the pain is IBS, states that she is here today because the pain is constant.  States that a bowl movement usually makes the pain better, but today the pain came right back.

## 2021-11-10 LAB — URINALYSIS, ROUTINE W REFLEX MICROSCOPIC
Bilirubin, UA: NEGATIVE
Glucose, UA: NEGATIVE
Ketones, UA: NEGATIVE
Nitrite, UA: POSITIVE — AB
Specific Gravity, UA: 1.014 (ref 1.005–1.030)
Urobilinogen, Ur: 2 mg/dL — ABNORMAL HIGH (ref 0.2–1.0)
pH, UA: 6.5 (ref 5.0–7.5)

## 2021-11-10 LAB — MICROSCOPIC EXAMINATION
Bacteria, UA: NONE SEEN
Casts: NONE SEEN /lpf
RBC, Urine: >30 /hpf — AB (ref 0–2)
WBC, UA: NONE SEEN /hpf (ref 0–5)

## 2021-11-11 LAB — URINE CULTURE

## 2021-12-25 ENCOUNTER — Emergency Department (HOSPITAL_COMMUNITY)
Admission: EM | Admit: 2021-12-25 | Discharge: 2021-12-25 | Discharge status: Against Medical Advice | Payer: Medicaid Other | Attending: Emergency Medicine | Admitting: Emergency Medicine

## 2021-12-25 ENCOUNTER — Encounter (HOSPITAL_COMMUNITY): Payer: Self-pay

## 2021-12-25 ENCOUNTER — Other Ambulatory Visit: Payer: Self-pay

## 2021-12-25 DIAGNOSIS — R109 Unspecified abdominal pain: Secondary | ICD-10-CM | POA: Diagnosis present

## 2021-12-25 DIAGNOSIS — Z5321 Procedure and treatment not carried out due to patient leaving prior to being seen by health care provider: Secondary | ICD-10-CM | POA: Diagnosis not present

## 2021-12-25 LAB — COMPREHENSIVE METABOLIC PANEL
ALT: 64 U/L — ABNORMAL HIGH (ref 0–44)
AST: 97 U/L — ABNORMAL HIGH (ref 15–41)
Albumin: 3.8 g/dL (ref 3.5–5.0)
Alkaline Phosphatase: 79 U/L (ref 38–126)
Anion gap: 9 (ref 5–15)
BUN: 16 mg/dL (ref 6–20)
CO2: 27 mmol/L (ref 22–32)
Calcium: 9.1 mg/dL (ref 8.9–10.3)
Chloride: 103 mmol/L (ref 98–111)
Creatinine, Ser: 0.67 mg/dL (ref 0.44–1.00)
GFR, Estimated: >60 mL/min (ref >60)
Glucose, Bld: 127 mg/dL — ABNORMAL HIGH (ref 70–99)
Potassium: 3.5 mmol/L (ref 3.5–5.1)
Sodium: 139 mmol/L (ref 135–145)
Total Bilirubin: 0.8 mg/dL (ref 0.3–1.2)
Total Protein: 7.1 g/dL (ref 6.5–8.1)

## 2021-12-25 LAB — CBC
HCT: 38.8 % (ref 36.0–46.0)
Hemoglobin: 12.7 g/dL (ref 12.0–15.0)
MCH: 28.9 pg (ref 26.0–34.0)
MCHC: 32.7 g/dL (ref 30.0–36.0)
MCV: 88.2 fL (ref 80.0–100.0)
Platelets: 280 10*3/uL (ref 150–400)
RBC: 4.4 MIL/uL (ref 3.87–5.11)
RDW: 13.4 % (ref 11.5–15.5)
WBC: 14 10*3/uL — ABNORMAL HIGH (ref 4.0–10.5)
nRBC: 0 % (ref 0.0–0.2)

## 2021-12-25 LAB — LIPASE, BLOOD: Lipase: 121 U/L — ABNORMAL HIGH (ref 11–51)

## 2021-12-25 NOTE — ED Triage Notes (Signed)
Pt arrived from home via [facility] w c/o abd pain 2/10 at present. Pain was originally felt between shoulder blades.

## 2021-12-28 ENCOUNTER — Other Ambulatory Visit: Payer: Self-pay | Admitting: Gastroenterology

## 2021-12-28 DIAGNOSIS — R1011 Right upper quadrant pain: Secondary | ICD-10-CM

## 2022-01-14 ENCOUNTER — Other Ambulatory Visit: Payer: Medicaid Other

## 2022-02-02 ENCOUNTER — Ambulatory Visit (HOSPITAL_COMMUNITY)
Admission: RE | Admit: 2022-02-02 | Discharge: 2022-02-02 | Disposition: A | Discharge status: Home / Self Care | Payer: Medicaid Other | Source: Ambulatory Visit | Attending: Gastroenterology | Admitting: Gastroenterology

## 2022-02-02 ENCOUNTER — Other Ambulatory Visit (HOSPITAL_COMMUNITY): Payer: Self-pay | Admitting: Gastroenterology

## 2022-02-02 DIAGNOSIS — R1011 Right upper quadrant pain: Secondary | ICD-10-CM | POA: Insufficient documentation

## 2022-02-02 MED ORDER — GADOBUTROL 1 MMOL/ML IV SOLN
10.0000 mL | Freq: Once | INTRAVENOUS | Status: AC | PRN
  Administered 2022-02-02: 10 mL via INTRAVENOUS

## 2022-03-02 ENCOUNTER — Encounter: Payer: Self-pay | Admitting: Diagnostic Neuroimaging

## 2022-03-02 ENCOUNTER — Ambulatory Visit: Payer: Medicaid Other | Admitting: Diagnostic Neuroimaging

## 2022-03-02 VITALS — BP 137/90 | HR 60 | Ht 63.0 in | Wt 216.0 lb

## 2022-03-02 DIAGNOSIS — M5441 Lumbago with sciatica, right side: Secondary | ICD-10-CM

## 2022-03-02 DIAGNOSIS — M5442 Lumbago with sciatica, left side: Secondary | ICD-10-CM | POA: Diagnosis not present

## 2022-03-02 DIAGNOSIS — M79672 Pain in left foot: Secondary | ICD-10-CM | POA: Diagnosis not present

## 2022-03-02 DIAGNOSIS — G8929 Other chronic pain: Secondary | ICD-10-CM

## 2022-03-02 DIAGNOSIS — M79671 Pain in right foot: Secondary | ICD-10-CM | POA: Diagnosis not present
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
## 2022-03-06 ENCOUNTER — Telehealth: Payer: Self-pay | Admitting: Diagnostic Neuroimaging

## 2022-03-06 NOTE — Telephone Encounter (Signed)
[facility] [place]: [phone] exp. 03/06/2022 - 05/04/2022 sent to GI [phone]

## 2022-03-07 DIAGNOSIS — K5900 Constipation, unspecified: Secondary | ICD-10-CM | POA: Insufficient documentation

## 2022-03-07 DIAGNOSIS — R1084 Generalized abdominal pain: Secondary | ICD-10-CM | POA: Insufficient documentation

## 2022-03-07 DIAGNOSIS — K580 Irritable bowel syndrome with diarrhea: Secondary | ICD-10-CM | POA: Insufficient documentation

## 2022-03-07 DIAGNOSIS — R152 Fecal urgency: Secondary | ICD-10-CM | POA: Insufficient documentation

## 2022-03-08 ENCOUNTER — Encounter: Payer: Self-pay | Admitting: Internal Medicine

## 2022-03-08 ENCOUNTER — Ambulatory Visit: Payer: Medicaid Other | Admitting: Internal Medicine

## 2022-03-08 VITALS — BP 133/79 | HR 66 | Temp 98.5°F | Ht 63.0 in | Wt 220.2 lb

## 2022-03-08 DIAGNOSIS — I10 Essential (primary) hypertension: Secondary | ICD-10-CM | POA: Diagnosis not present

## 2022-03-08 DIAGNOSIS — M19072 Primary osteoarthritis, left ankle and foot: Secondary | ICD-10-CM

## 2022-03-08 DIAGNOSIS — E282 Polycystic ovarian syndrome: Secondary | ICD-10-CM

## 2022-03-08 DIAGNOSIS — Z9889 Other specified postprocedural states: Secondary | ICD-10-CM

## 2022-03-08 DIAGNOSIS — R1084 Generalized abdominal pain: Secondary | ICD-10-CM

## 2022-03-08 DIAGNOSIS — R200 Anesthesia of skin: Secondary | ICD-10-CM

## 2022-03-08 DIAGNOSIS — K580 Irritable bowel syndrome with diarrhea: Secondary | ICD-10-CM

## 2022-03-08 DIAGNOSIS — G629 Polyneuropathy, unspecified: Secondary | ICD-10-CM

## 2022-03-08 DIAGNOSIS — K219 Gastro-esophageal reflux disease without esophagitis: Secondary | ICD-10-CM | POA: Insufficient documentation

## 2022-03-08 DIAGNOSIS — E669 Obesity, unspecified: Secondary | ICD-10-CM

## 2022-03-08 DIAGNOSIS — G43809 Other migraine, not intractable, without status migrainosus: Secondary | ICD-10-CM

## 2022-03-08 DIAGNOSIS — N6311 Unspecified lump in the right breast, upper outer quadrant: Secondary | ICD-10-CM

## 2022-03-08 DIAGNOSIS — R7303 Prediabetes: Secondary | ICD-10-CM

## 2022-03-08 MED ORDER — SEMAGLUTIDE-WEIGHT MANAGEMENT 0.25 MG/0.5ML ~~LOC~~ SOAJ: 0.2500 mg | SUBCUTANEOUS | 0 refills | Status: AC

## 2022-03-08 MED ORDER — SEMAGLUTIDE-WEIGHT MANAGEMENT 0.5 MG/0.5ML ~~LOC~~ SOAJ: 0.5000 mg | SUBCUTANEOUS | 0 refills | Status: AC

## 2022-03-08 MED ORDER — SEMAGLUTIDE-WEIGHT MANAGEMENT 2.4 MG/0.75ML ~~LOC~~ SOAJ: 2.4000 mg | SUBCUTANEOUS | 0 refills | Status: DC

## 2022-03-08 MED ORDER — SEMAGLUTIDE-WEIGHT MANAGEMENT 1 MG/0.5ML ~~LOC~~ SOAJ: 1.0000 mg | SUBCUTANEOUS | 0 refills | Status: DC

## 2022-03-08 MED ORDER — CARVEDILOL 12.5 MG PO TABS: 12.5000 mg | ORAL_TABLET | Freq: Two times a day (BID) | ORAL | 3 refills | Status: DC

## 2022-03-08 MED ORDER — SEMAGLUTIDE-WEIGHT MANAGEMENT 1.7 MG/0.75ML ~~LOC~~ SOAJ: 1.7000 mg | SUBCUTANEOUS | 0 refills | Status: DC

## 2022-03-08 NOTE — Assessment & Plan Note (Addendum)
Offered to continue gabapentin but she doesn't need right now Neurology working up, sounds like sciatica

## 2022-03-08 NOTE — Assessment & Plan Note (Signed)
History of considering metformin but was afraid of reaction.

## 2022-03-08 NOTE — Assessment & Plan Note (Signed)
I reviewed her blood pressure was controlled on the second check's and I advised to stay on the current medications which is hydrochlorothiazide 25 mg carvedilol 3.125 mg twice daily and it in 100 and grams daily and limit salt and NSAID use

## 2022-03-08 NOTE — Progress Notes (Signed)
[place]  Phone: [phone]  New patient visit  Visit Date: 03/08/2022 Patient: Kathy White   DOB: [birth year]   41 y.o. Female  MRN: [id number]  Today's healthcare provider: Loralee Pacas, MD  Assessment and Plan:   Complex medical history so we spent 45 minutes reconciling problems and medications and documenting her care team  and past medical history today, even after certified medical assistant had spent 40 minutes adding documentation.  I advised hold off on clonidine blood pressure is good enough without it and I will likely choose something else when she returns next month if she having blood pressure issues.  Angelize was seen today for establish care and hypertension.  Primary hypertension Overview: On HYDROCHLOROTHIAZIDE 25 mg  On losartan  On carvedilol On clonidine 0.1 yet to start   Assessment & Plan: I reviewed her blood pressure was controlled on the second check's and I advised to stay on the current medications which is hydrochlorothiazide 25 mg carvedilol 3.125 mg twice daily and it in 100 and grams daily and limit salt and NSAID use  Orders: -     Carvedilol; Take 1 tablet (12.5 mg total) by mouth 2 (two) times daily with a meal.  Dispense: 180 tablet; Refill: 3  S/P ACL reconstruction Overview: Continues to struggle with left knee Had meniscus shave Following with ortho   Other migraine without status migrainosus, not intractable Overview: Used to be on inderal Following with neurology    Irritable bowel syndrome with diarrhea Overview: This is listed as IBS-D but she does sometimes get constipation with been on Linzess and follows with gastroenterology   Generalized abdominal pain Overview: Gastroenterology at [facility] did an MRI to evaluate and on November 24 it was reported as completely normal just a benign renal cyst that does not need follow-up   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 GLaurann Montanagynecology     PCOS (polycystic ovarian syndrome) Overview:  Spironolactone for hirsutism was stopped after failing to help a long time ago Tried to lose weight with bariatric surgery Menstrual cycles are light with iud   Prediabetes Overview: Lab Results  Component Value Date   HGBA1C 5.9 (H) 03/02/2022     Assessment & Plan: History of considering metformin but was afraid of reaction.   Right facial numbness Overview: Following with neurology Attributed to migraines Had mri approximately 04/17/21    Neuropathy Overview: History of cts surgery Gabapentin helps   Assessment & Plan: Offered to continue gabapentin but she doesn't need right now Neurology working up, sounds like sciatica   Numbness of hand Overview: Persistent after carpal release 05/2021   Arthritis of ankle or foot, degenerative, left  Obesity due to energy imbalance -     Semaglutide-Weight Management; Inject 0.25 mg into the skin once a week for 28 days.  Dispense: 2 mL; Refill: 0 -     Semaglutide-Weight Management; Inject 0.5 mg into the skin once a week for 28 days.  Dispense: 2 mL; Refill: 0 -     Semaglutide-Weight Management; Inject 1 mg into the skin once a week for 28 days.  Dispense: 2 mL; Refill: 0 -     Semaglutide-Weight Management; Inject 1.7 mg into the skin once a week for 28 days.  Dispense: 3 mL; Refill: 0 -     Semaglutide-Weight Management; Inject 2.4  mg into the skin once a week for 28 days.  Dispense: 3 mL; Refill: 0      Subjective:  Patient presents today to establish care.  Chief Complaint  Patient presents with   Establish Care   Hypertension    Problem-oriented charting was used to develop and update her medical history: Problem  Neuropathy   History of cts surgery Gabapentin helps    Generalized Abdominal Pain    Gastroenterology at [facility] did an MRI to evaluate and on November 24 it was reported as completely normal just a benign renal cyst that does not need follow-up   Irritable Bowel Syndrome With Diarrhea   This is listed as IBS-D but she does sometimes get constipation with been on Linzess and follows with gastroenterology   Right Facial Numbness   Following with neurology Attributed to migraines Had mri approximately 04/17/21    Numbness of Hand   Persistent after carpal release 05/2021   Mass of Upper Outer Quadrant of Right Breast   Was BRCA negative and found to have clusters of cysts and getting q15multrasound following with Dr. GLaurann Montanagynecology     Prediabetes   Lab Results  Component Value Date   HGBA1C 5.9 (H) 03/02/2022      Pcos (Polycystic Ovarian Syndrome)    Spironolactone for hirsutism was stopped after failing to help a long time ago Tried to lose weight with bariatric surgery Menstrual cycles are light with iud   S/P Acl Reconstruction   Continues to struggle with left knee Had meniscus shave Following with ortho   Hypertension   On HYDROCHLOROTHIAZIDE 25 mg  On losartan  On carvedilol On clonidine 0.1 yet to start    Migraine   Used to be on inderal Following with neurology    Constipation (Resolved)  Fecal Urgency (Resolved)  Stiffness of Left Knee (Resolved)  Tear of Medial Meniscus of Knee (Resolved)  Pain in Joint of Left Knee (Resolved)  Tear of Lateral Meniscus of Knee (Resolved)  Encounter for Orthopedic Follow-Up Care (Resolved)  Weight Gain (Resolved)  Vaginal Irritation (Resolved)  Llq Pain (Resolved)  Routine Medical Exam (Resolved)  Screening Examination for Std (Sexually Transmitted Disease) (Resolved)  Pelvic Pain (Resolved)  Encounter for Well Woman Exam With Routine Gynecological Exam (Resolved)  Brca Negative (Resolved)  Benign Essential Htn (Resolved)  Hematuria (Resolved)  Swelling of Both Ankles (Resolved)      Depression Screen    10/23/2021    1:54 PM 07/21/2020    3:43 PM 07/21/2019   11:35 AM 02/25/2018    9:14 AM  PHQ 2/9 Scores  PHQ - 2 Score 0 0 0 0  PHQ- 9 Score 4 4     No results found for any visits on 03/08/22.  The following were reviewed and entered/updated into [facility]:  Diagnosis Date   Acute meniscal tear of left knee    Allergy    Any narcotics pain medications, adhesive tape   Benign essential HTN 01/13/2014   BRCA gene mutation negative    BRCA negative 09/14/2015   FH: breast cancer in first degree relative 02/26/2013   Fibroid 09/15/2015   GERD (gastroesophageal reflux disease) 03/12/2018   More frequent after gastric sleeve surgery   Hematuria 04/21/2013   History of abnormal cervical Pap smear    History of HPV infection    Hypertension    IBS (irritable bowel syndrome)    IBS (irritable bowel syndrome)  Left ACL tear    Migraine    OA (osteoarthritis) of knee    Obesity    Osteoarthritis    Pain of joint of left ankle and foot 10/08/2019   PCO (polycystic ovaries)    Pelvic pain 09/16/2018   S/P ACL reconstruction 03/22/2015   Stiffness of left knee 09/25/2021   Vaginal Pap smear, abnormal    Vitamin D deficiency    Wears glasses    Past Surgical History:  Procedure Laterality Date   CARPAL TUNNEL RELEASE Right 04/28/2021   CERVICAL BIOPSY  W/ LOOP ELECTRODE EXCISION     CESAREAN SECTION  11/14/2004   EXAM UNDER ANESTHESIA WITH MANIPULATION OF KNEE Left 03/22/2015   Procedure: LEFT KNEE EXAM UNDER ANESTHESIA  ;  Surgeon: Sydnee Cabal, MD;  Location: [place];  Service: Orthopedics;  Laterality: Left;   HAND WOUND EXPLORATION AND TENDON REPAIR'S Right 10/12/2014   right ring and index fingers   KNEE ARTHROSCOPY WITH ANTERIOR CRUCIATE LIGAMENT (ACL) REPAIR WITH HAMSTRING GRAFT Left 03/22/2015   Procedure: LEFT KNEE ARTHROSCOPY WITH ANTERIOR CRUCIATE LIGAMENT (ACL) AUTOGRAFT  RECONSTRUCTION;  Surgeon: Sydnee Cabal, MD;  Location: [place];  Service: Orthopedics;  Laterality: Left;  ANESTHESIA: GENERAL/ABDUCTOR CANAL BLOCK   KNEE SURGERY  09/2021   LAPAROSCOPIC CHOLECYSTECTOMY  12/20/2004   LAPAROSCOPIC GASTRIC BAND REMOVAL WITH LAPAROSCOPIC GASTRIC SLEEVE RESECTION  09/09/2017   MENISECTOMY Left 03/22/2015   Procedure: PARTIAL LEFT KNEE  LATERAL MENISECTOMY, CHONDROPLASTY;  Surgeon: Sydnee Cabal, MD;  Location: [place];  Service: Orthopedics;  Laterality: Left;   NERVE, TENDON AND ARTERY REPAIR Right 10/12/2014   Procedure: RIGHT RING FINGER AND RIGHT SMALL FINGER WOUND EXPLORATION WITH TENDON REPAIRS;  Surgeon: Iran Planas, MD;  Location: [place];  Service: Orthopedics;  Laterality: Right;   WISDOM TOOTH EXTRACTION     Family History  Problem Relation Age of Onset   Diabetes Paternal Grandfather    Cancer Maternal Grandmother        +BRCA 1   BRCA 1/2 Maternal Grandmother    Breast cancer Maternal Grandmother    Cancer Maternal Grandfather    Diabetes Father    Hypertension Father    Other Father        open heart surgery   Anxiety disorder Father    Depression Father    Heart disease Father    Stroke Father    Cancer Mother 22       breast +BRCA 1   Hypertension Mother    Breast cancer Mother    BRCA 1/2 Mother    Thyroid disease Brother    Cancer Sister 65       breast +BRCA 1   Hypertension Sister    Diabetes Sister    Breast cancer Sister    Stroke Sister    Anxiety disorder Sister    Depression Sister    Heart disease Sister    Diabetes Sister    Hypertension Sister    Neuropathy Sister    Obesity Sister    Anxiety disorder Sister    Depression Sister    Hypertension Sister    Thyroid disease Sister    Cancer Sister        endometrial   Obesity Sister    Anxiety disorder Son    Depression Son    Obesity Son    Cancer Cousin 29       breast   BRCA 1/2 Cousin    Breast cancer  Cousin     Cancer Maternal Aunt    Cancer Maternal Aunt    I attest that I have reviewed and confirmed the patients current medications to meet the medication reconciliation requirement  Outpatient Medications Prior to Visit  Medication Sig Dispense Refill   Albuterol Sulfate (PROAIR RESPICLICK) 601 (90 Base) MCG/ACT AEPB 1 puff as needed Inhalation every 4-6 hrs as needed for cough/wheeze for 90 days     budesonide-formoterol (SYMBICORT) 160-4.5 MCG/ACT inhaler As needed.     Calcium Carb-Cholecalciferol (CALTRATE 600+D3 PO) Take 1 tablet by mouth daily.     cetirizine (ZYRTEC) 10 MG tablet Take by mouth.     cholecalciferol (VITAMIN D) 1000 units tablet Take 4,000 Units by mouth daily.     cloNIDine (CATAPRES) 0.1 MG tablet      Cranberry-Vitamin C-Probiotic (AZO CRANBERRY PO) Take 2 capsules by mouth daily in the afternoon.     cyclobenzaprine (FLEXERIL) 10 MG tablet As needed.     EPINEPHrine 0.3 mg/0.3 mL IJ SOAJ injection epinephrine 0.3 mg/0.3 mL injection, auto-injector     fluticasone (FLONASE) 50 MCG/ACT nasal spray Place 2 sprays into the nose as needed for allergies.      gabapentin (NEURONTIN) 400 MG capsule Take 400 mg by mouth at bedtime.     hydrochlorothiazide (HYDRODIURIL) 25 MG tablet Take 1 tablet by mouth daily.     hydrOXYzine (ATARAX/VISTARIL) 10 MG tablet hydroxyzine HCl 10 mg tablet     ipratropium (ATROVENT) 0.06 % nasal spray 2 sprays in each nostril     levonorgestrel (MIRENA) 20 MCG/24HR IUD 1 each by Intrauterine route once.     linaclotide (LINZESS) 145 MCG CAPS capsule Take 145 mcg by mouth. As needed.     losartan (COZAAR) 100 MG tablet Take 1 tablet by mouth daily.     meloxicam (MOBIC) 7.5 MG tablet As needed.     montelukast (SINGULAIR) 10 MG tablet Take 1 tablet by mouth daily.     Multiple Vitamins-Calcium (ONE-A-DAY WOMENS PO) Take by mouth daily.     omeprazole (PRILOSEC) 40 MG capsule Take one night before surgery (between 9 and 11pm) than once daily      Phentermine HCl (LOMAIRA) 8 MG TABS Lomaira 8 mg tablet  TAKE 1 TABLET BY MOUTH 30 MINUTES BEFORE BREAKFAST AND 1 TABLET AT 3 PM DAILY     traZODone (DESYREL) 50 MG tablet      triamcinolone lotion (KENALOG) 0.1 %      carvedilol (COREG) 3.125 MG tablet Take 12.5 mg by mouth 2 (two) times daily.     levocetirizine (XYZAL) 5 MG tablet Take 5 mg by mouth at bedtime.     aspirin EC 81 MG tablet Take 81 mg by mouth daily. Swallow whole. (Patient not taking: Reported on 03/02/2022)     calcium carbonate (OSCAL) 1500 (600 Ca) MG TABS tablet Take by mouth 2 (two) times daily with a meal.     nitrofurantoin, macrocrystal-monohydrate, (MACROBID) 100 MG capsule Take 1 capsule (100 mg total) by mouth 2 (two) times daily. (Patient not taking: Reported on 03/02/2022) 14 capsule 0   Spacer/Aero-Holding Chambers (AEROCHAMBER MV) inhaler See admin instructions. (Patient not taking: Reported on 03/08/2022)     No facility-administered medications prior to visit.    Allergies  Allergen Reactions   Bee Venom Anaphylaxis and Hives   Hydrocodone Nausea Only and Rash    Other reaction(s): Unknown   Oxycodone-Acetaminophen Nausea And Vomiting    Any of this type of  pain medication   Amlodipine Swelling    Swelling in feet, ankles and legs   Attends Briefs Small Itching   Percocet [Oxycodone-Acetaminophen] Nausea And Vomiting   Tape Rash   Social History   Tobacco Use   Smoking status: Never   Smokeless tobacco: Never  Vaping Use   Vaping Use: Never used  Substance Use Topics   Alcohol use: No   Drug use: No    Immunization History  Administered Date(s) Administered   Moderna Sars-Covid-2 Vaccination 06/19/2019, 07/17/2019   Tdap 10/13/2013    Objective:  BP 133/79 (BP Location: Left Arm, Patient Position: Sitting)   Pulse 66   Temp 98.5 F (36.9 C) (Temporal)   Ht _0  (1.6 m)   Wt 220 lb 3.2 oz (99.9 kg)   SpO2 98%   BMI 39.01 kg/m  Body mass index is 39.01 kg/m.  Physical Exam   Vital signs reviewed.  Nursing notes reviewed. General Appearance/Constitutional:  Obese female in no acute distress Musculoskeletal: All extremities are intact.  Neurological:  Awake, alert,  No obvious focal neurological deficits or cognitive impairments Psychiatric:  Appropriate mood, pleasant demeanor   Results Reviewed: Results for orders placed or performed in visit on 03/02/22  Vitamin B12  Result Value Ref Range   Vitamin B-12 542 232 - 1,245 pg/mL  MMA  Result Value Ref Range   Methylmalonic Acid 150 0 - 378 nmol/L  Homocysteine  Result Value Ref Range   Homocysteine 10.1 0.0 - 14.5 umol/L  A1c  Result Value Ref Range   Hgb A1c MFr Bld 5.9 (H) 4.8 - 5.6 %   Est. average glucose Bld gHb Est-mCnc 123 mg/dL  TSH  Result Value Ref Range   TSH 3.080 0.450 - 4.500 uIU/mL  SPEP with IFE  Result Value Ref Range   IgG (Immunoglobin G), Serum 967 586 - 1,602 mg/dL   IgA/Immunoglobulin A, Serum 172 87 - 352 mg/dL   IgM (Immunoglobulin M), Srm 102 26 - 217 mg/dL   Total Protein 6.9 6.0 - 8.5 g/dL   Albumin SerPl Elph-Mcnc 3.9 2.9 - 4.4 g/dL   Alpha 1 0.2 0.0 - 0.4 g/dL   Alpha2 Glob SerPl Elph-Mcnc 0.8 0.4 - 1.0 g/dL   B-Globulin SerPl Elph-Mcnc 1.1 0.7 - 1.3 g/dL   Gamma Glob SerPl Elph-Mcnc 0.9 0.4 - 1.8 g/dL   M Protein SerPl Elph-Mcnc Not Observed Not Observed g/dL   Globulin, Total 3.0 2.2 - 3.9 g/dL   Albumin/Glob SerPl 1.4 0.7 - 1.7   IFE 1 Comment    Please Note Comment   ANA w/Reflex  Result Value Ref Range   ANA Titer 1 WILL FOLLOW   SSA, SSB  Result Value Ref Range   ENA SSA (RO) Ab 0.2 0.0 - 0.9 AI   ENA SSB (LA) Ab 0.3 0.0 - 0.9 AI  Vitamin B6  Result Value Ref Range   Vitamin B6 18.4 3.4 - 65.2 ug/L  Vitamin B1  Result Value Ref Range   Thiamine WILL FOLLOW   Copper  Result Value Ref Range   Copper WILL FOLLOW

## 2022-03-13 ENCOUNTER — Encounter: Payer: Self-pay | Admitting: Internal Medicine

## 2022-03-13 ENCOUNTER — Telehealth: Payer: Self-pay | Admitting: Internal Medicine

## 2022-03-13 LAB — TSH: TSH: 3.08 u[IU]/mL (ref 0.450–4.500)

## 2022-03-13 LAB — MULTIPLE MYELOMA PANEL, SERUM
Albumin SerPl Elph-Mcnc: 3.9 g/dL (ref 2.9–4.4)
Albumin/Glob SerPl: 1.4 (ref 0.7–1.7)
Alpha 1: 0.2 g/dL (ref 0.0–0.4)
Alpha2 Glob SerPl Elph-Mcnc: 0.8 g/dL (ref 0.4–1.0)
B-Globulin SerPl Elph-Mcnc: 1.1 g/dL (ref 0.7–1.3)
Gamma Glob SerPl Elph-Mcnc: 0.9 g/dL (ref 0.4–1.8)
Globulin, Total: 3 g/dL (ref 2.2–3.9)
IgA/Immunoglobulin A, Serum: 172 mg/dL (ref 87–352)
IgG (Immunoglobin G), Serum: 967 mg/dL (ref 586–1602)
IgM (Immunoglobulin M), Srm: 102 mg/dL (ref 26–217)
Total Protein: 6.9 g/dL (ref 6.0–8.5)

## 2022-03-13 LAB — VITAMIN B6: Vitamin B6: 18.4 ug/L (ref 3.4–65.2)

## 2022-03-13 LAB — HEMOGLOBIN A1C
Est. average glucose Bld gHb Est-mCnc: 123 mg/dL
Hgb A1c MFr Bld: 5.9 % — ABNORMAL HIGH (ref 4.8–5.6)

## 2022-03-13 LAB — COPPER, SERUM: Copper: 130 ug/dL (ref 80–158)

## 2022-03-13 LAB — VITAMIN B1: Thiamine: 139.5 nmol/L (ref 66.5–200.0)

## 2022-03-13 LAB — ANA W/REFLEX: ANA Titer 1: NEGATIVE

## 2022-03-13 LAB — VITAMIN B12: Vitamin B-12: 542 pg/mL (ref 232–1245)

## 2022-03-13 LAB — SJOGREN'S SYNDROME ANTIBODS(SSA + SSB)
ENA SSA (RO) Ab: 0.2 AI (ref 0.0–0.9)
ENA SSB (LA) Ab: 0.3 AI (ref 0.0–0.9)

## 2022-03-13 LAB — HOMOCYSTEINE: Homocysteine: 10.1 umol/L (ref 0.0–14.5)

## 2022-03-13 LAB — METHYLMALONIC ACID, SERUM: Methylmalonic Acid: 150 nmol/L (ref 0–378)

## 2022-03-13 NOTE — Telephone Encounter (Signed)
Patient states: - [facility] informed her that generic wegovy sent in on 12/28 was on back order - [facility] in [place] on [address] only has higher dosages ( 1.7 and higher) - [facility] [place] states medication hadn't been released yet. She isn't sure what this means  Patient requests: - Recommendations from PCP on what to do next since medication is unattainable

## 2022-03-20 NOTE — Telephone Encounter (Signed)
Informed patient of PCP message below. Pt verbalized understanding.   Patient states: - She has been taking phentermine 16 mg for some time now but believes body is getting used to it  - Has a history of gastric sleeve; currently sees a bariatric doctor   I offered pt a virtual visit to discuss other options but she declined for now. States she will try to talk to bariatric doctor about options first. Will callback and schedule if needed.

## 2022-03-24 ENCOUNTER — Ambulatory Visit
Admission: RE | Admit: 2022-03-24 | Discharge: 2022-03-24 | Disposition: A | Discharge status: Home / Self Care | Payer: Medicaid Other | Source: Ambulatory Visit | Attending: Diagnostic Neuroimaging | Admitting: Diagnostic Neuroimaging

## 2022-03-24 DIAGNOSIS — M79672 Pain in left foot: Secondary | ICD-10-CM | POA: Diagnosis not present

## 2022-03-24 DIAGNOSIS — G8929 Other chronic pain: Secondary | ICD-10-CM

## 2022-03-24 DIAGNOSIS — M79671 Pain in right foot: Secondary | ICD-10-CM

## 2022-03-26 ENCOUNTER — Telehealth: Payer: Self-pay | Admitting: Diagnostic Neuroimaging

## 2022-03-26 NOTE — Telephone Encounter (Signed)
Called and spoke to patient in regards to results of MRI. She was understanding of information and Pt had no questions at this time but was encouraged to call back if questions arise.

## 2022-03-26 NOTE — Telephone Encounter (Signed)
Pt is asking for a call to discuss the lumbar scan results in greater detail.  Pt is still having the burning sensation in legs, pain and tingling.

## 2022-04-16 ENCOUNTER — Other Ambulatory Visit: Payer: Medicaid Other

## 2022-04-19 ENCOUNTER — Other Ambulatory Visit (HOSPITAL_COMMUNITY): Payer: Self-pay | Admitting: Adult Health

## 2022-04-19 DIAGNOSIS — N6019 Diffuse cystic mastopathy of unspecified breast: Secondary | ICD-10-CM

## 2022-05-09 ENCOUNTER — Ambulatory Visit: Payer: Medicaid Other | Admitting: Internal Medicine

## 2022-05-10 ENCOUNTER — Ambulatory Visit (HOSPITAL_COMMUNITY)
Admission: RE | Admit: 2022-05-10 | Discharge: 2022-05-10 | Disposition: A | Discharge status: Home / Self Care | Payer: Medicaid Other | Source: Ambulatory Visit | Attending: Adult Health | Admitting: Adult Health

## 2022-05-10 DIAGNOSIS — N6019 Diffuse cystic mastopathy of unspecified breast: Secondary | ICD-10-CM

## 2022-05-22 ENCOUNTER — Encounter: Payer: Self-pay | Admitting: Adult Health

## 2022-05-22 ENCOUNTER — Ambulatory Visit: Payer: Medicaid Other | Admitting: Adult Health

## 2022-05-22 VITALS — BP 138/84 | HR 72 | Ht 63.0 in | Wt 225.5 lb

## 2022-05-22 DIAGNOSIS — R350 Frequency of micturition: Secondary | ICD-10-CM | POA: Diagnosis not present

## 2022-05-22 DIAGNOSIS — Z803 Family history of malignant neoplasm of breast: Secondary | ICD-10-CM

## 2022-05-22 DIAGNOSIS — R300 Dysuria: Secondary | ICD-10-CM | POA: Insufficient documentation

## 2022-05-22 LAB — POCT URINALYSIS DIPSTICK
Glucose, UA: POSITIVE — AB
Nitrite, UA: POSITIVE
Protein, UA: POSITIVE — AB

## 2022-05-22 MED ORDER — SULFAMETHOXAZOLE-TRIMETHOPRIM 800-160 MG PO TABS: 1.0000 | ORAL_TABLET | Freq: Two times a day (BID) | ORAL | 0 refills | Status: DC

## 2022-05-22 MED ORDER — FLUCONAZOLE 150 MG PO TABS: ORAL_TABLET | ORAL | 1 refills | Status: DC

## 2022-05-22 NOTE — Progress Notes (Signed)
  Subjective:     Patient ID: Kathy White, female   DOB: 07/18/1980, 42 y.o.   MRN: [id number]  HPI Kathy White is a 42 year old white female,single, G1P0101, in complaining of urinary frequency and burning with urination and wants to talk about getting MR breast. Has strong family history of breast cancer, with +BRCA 40 gene MGM,mother and sister. Has had mammograms.   Last pap was negative HPV and NILM 07/21/19   Review of Systems +urinary frequency  +burning with urination  Reviewed past medical,surgical, social and family history. Reviewed medications and allergies.     Objective:   Physical Exam BP 138/84 (BP Location: Right Arm, Patient Position: Sitting, Cuff Size: Large)   Pulse 72   Ht 5\' 3"  (1.6 m)   Wt 225 lb 8 oz (102.3 kg)   LMP 05/16/2022   BMI 39.95 kg/m  Urine dipstick +protein, +nitrates, large leuks. Skin warm and dry. Lungs: clear to ausculation bilaterally. Cardiovascular: regular rate and rhythm. NO CVAT Fall risk is low  Upstream - 05/22/22 1637       Pregnancy Intention Screening   Does the patient want to become pregnant in the next year? No    Does the patient's partner want to become pregnant in the next year? No    Would the patient like to discuss contraceptive options today? No      Contraception Wrap Up   Current Method IUD or IUS    End Method IUD or IUS                Assessment:     1. Urinary frequency UA C&S sent to rule out UTI - POCT Urinalysis Dipstick - Urine Culture - Urinalysis, Routine w reflex microscopic  2. Burning with urination +nitrates,protein and leuks Will rx septra ds and diflucan Meds ordered this encounter  Medications   sulfamethoxazole-trimethoprim (BACTRIM DS) 800-160 MG tablet    Sig: Take 1 tablet by mouth 2 (two) times daily. Take 1 bid    Dispense:  14 tablet    Refill:  0    Order Specific Question:   Supervising Provider    Answer:   Elonda Husky, LUTHER H [[id number]]   fluconazole (DIFLUCAN) 150 MG tablet     Sig: Take 1 now and 1 in 3 days    Dispense:  2 tablet    Refill:  1    Order Specific Question:   Supervising Provider    Answer:   Tania Ade H [[id number]]    - POCT Urinalysis Dipstick - Urine Culture - Urinalysis, Routine w reflex microscopic  3. FH: breast cancer in first degree relative Will get MR breast scheduled at [facility] and get Terrence Dupont to check for precert  - MR BREAST BILATERAL W [place] CAD; Future     Plan:     Return 10/25/22 for pap and physical

## 2022-05-23 ENCOUNTER — Telehealth: Payer: Self-pay | Admitting: Adult Health

## 2022-05-23 LAB — URINALYSIS, ROUTINE W REFLEX MICROSCOPIC
Glucose, UA: NEGATIVE
Ketones, UA: NEGATIVE
Nitrite, UA: POSITIVE — AB
Specific Gravity, UA: 1.021 (ref 1.005–1.030)
Urobilinogen, Ur: 2 mg/dL — ABNORMAL HIGH (ref 0.2–1.0)
pH, UA: 7 (ref 5.0–7.5)

## 2022-05-23 LAB — MICROSCOPIC EXAMINATION
Casts: NONE SEEN /lpf
RBC, Urine: >30 /hpf — AB (ref 0–2)

## 2022-05-23 NOTE — Telephone Encounter (Signed)
Pt aware that MR breast at [facility] 05/12/22 at 4 pm will get Kathy White to see if precert needed

## 2022-05-24 LAB — URINE CULTURE

## 2022-05-25 ENCOUNTER — Ambulatory Visit: Payer: Medicaid Other | Admitting: Internal Medicine

## 2022-06-01 ENCOUNTER — Ambulatory Visit (HOSPITAL_COMMUNITY)
Admission: RE | Admit: 2022-06-01 | Discharge: 2022-06-01 | Disposition: A | Discharge status: Home / Self Care | Payer: Medicaid Other | Source: Ambulatory Visit | Attending: Adult Health | Admitting: Adult Health

## 2022-06-01 DIAGNOSIS — Z803 Family history of malignant neoplasm of breast: Secondary | ICD-10-CM | POA: Diagnosis present

## 2022-06-01 DIAGNOSIS — R928 Other abnormal and inconclusive findings on diagnostic imaging of breast: Secondary | ICD-10-CM | POA: Insufficient documentation

## 2022-06-01 DIAGNOSIS — Z1239 Encounter for other screening for malignant neoplasm of breast: Secondary | ICD-10-CM | POA: Diagnosis not present

## 2022-06-01 MED ORDER — GADOBUTROL 1 MMOL/ML IV SOLN
10.0000 mL | Freq: Once | INTRAVENOUS | Status: AC | PRN
  Administered 2022-06-01: 10 mL via INTRAVENOUS

## 2022-06-05 ENCOUNTER — Other Ambulatory Visit: Payer: Self-pay | Admitting: Adult Health

## 2022-06-05 DIAGNOSIS — R9389 Abnormal findings on diagnostic imaging of other specified body structures: Secondary | ICD-10-CM

## 2022-06-11 ENCOUNTER — Encounter: Payer: Self-pay | Admitting: Adult Health

## 2022-06-11 ENCOUNTER — Inpatient Hospital Stay: Admission: RE | Admit: 2022-06-11 | Payer: Medicaid Other | Source: Ambulatory Visit

## 2022-06-11 ENCOUNTER — Ambulatory Visit: Payer: Medicaid Other | Admitting: Adult Health

## 2022-06-11 VITALS — BP 136/84 | HR 73 | Ht 63.0 in | Wt 226.0 lb

## 2022-06-11 DIAGNOSIS — R300 Dysuria: Secondary | ICD-10-CM

## 2022-06-11 DIAGNOSIS — R350 Frequency of micturition: Secondary | ICD-10-CM

## 2022-06-11 LAB — POCT URINALYSIS DIPSTICK OB
Glucose, UA: NEGATIVE
Ketones, UA: NEGATIVE
Nitrite, UA: POSITIVE
POC,PROTEIN,UA: NEGATIVE

## 2022-06-11 MED ORDER — FLUCONAZOLE 150 MG PO TABS: ORAL_TABLET | ORAL | 1 refills | Status: DC

## 2022-06-11 MED ORDER — SULFAMETHOXAZOLE-TRIMETHOPRIM 800-160 MG PO TABS: 1.0000 | ORAL_TABLET | Freq: Two times a day (BID) | ORAL | 0 refills | Status: DC

## 2022-06-11 NOTE — Progress Notes (Signed)
  Subjective:     Patient ID: Kathy White, female   DOB: Apr 30, 1980, 42 y.o.   MRN: [id number]  HPI Kathy White is a 42 year old white female,single, G1P0101, in complaining of burning with urination and frequency. She was supposed to have breat biopsy today and over slept, she has rescheduled that.     Component Value Date/Time   DIAGPAP  07/21/2019 1102    - Negative for intraepithelial lesion or malignancy (NILM)   HPVHIGH Negative 07/21/2019 1102   ADEQPAP  07/21/2019 1102    Satisfactory for evaluation; transformation zone component PRESENT.   PCP is Dr Darron Doom.  Review of Systems +burning and frequency with urination Reviewed past medical,surgical, social and family history. Reviewed medications and allergies.     Objective:   Physical Exam BP 136/84 (BP Location: Left Arm, Patient Position: Sitting, Cuff Size: Normal)   Pulse 73   Ht 5\' 3"  (1.6 m)   Wt 226 lb (102.5 kg)   LMP 05/16/2022   BMI 40.03 kg/m  urine dipstick +leuks Skin warm and dry.  Lungs: clear to ausculation bilaterally. Cardiovascular: regular rate and rhythm.      Upstream - 06/11/22 1536       Pregnancy Intention Screening   Does the patient want to become pregnant in the next year? No    Does the patient's partner want to become pregnant in the next year? No    Would the patient like to discuss contraceptive options today? No      Contraception Wrap Up   Current Method IUD or IUS    End Method IUD or IUS    Contraception Counseling Provided No             Assessment:     1. Dysuria [place] C&S sent Will rx septra ds and will rx diflucan Push fluids  Meds ordered this encounter  Medications   sulfamethoxazole-trimethoprim (BACTRIM DS) 800-160 MG tablet    Sig: Take 1 tablet by mouth 2 (two) times daily. Take 1 bid    Dispense:  14 tablet    Refill:  0    Order Specific Question:   Supervising Provider    Answer:   Elonda Husky, LUTHER H [[id number]]   fluconazole (DIFLUCAN) 150 MG tablet    Sig: Take  1 now and 1 in 3 days    Dispense:  2 tablet    Refill:  1    Order Specific Question:   Supervising Provider    Answer:   Elonda Husky, LUTHER H [[id number]]    - POC Urinalysis Dipstick OB - Urinalysis - Urine Culture  2. Urinary frequency      Plan:       Follow up prn

## 2022-06-12 LAB — URINALYSIS
Bilirubin, UA: NEGATIVE
Glucose, UA: NEGATIVE
Ketones, UA: NEGATIVE
Nitrite, UA: POSITIVE — AB
Protein,UA: NEGATIVE
Specific Gravity, UA: 1.014 (ref 1.005–1.030)
Urobilinogen, Ur: 1 mg/dL (ref 0.2–1.0)
pH, UA: 5.5 (ref 5.0–7.5)

## 2022-06-13 ENCOUNTER — Ambulatory Visit
Admission: RE | Admit: 2022-06-13 | Discharge: 2022-06-13 | Disposition: A | Discharge status: Home / Self Care | Payer: Medicaid Other | Source: Ambulatory Visit | Attending: Adult Health | Admitting: Adult Health

## 2022-06-13 DIAGNOSIS — R9389 Abnormal findings on diagnostic imaging of other specified body structures: Secondary | ICD-10-CM

## 2022-06-13 MED ORDER — GADOPICLENOL 0.5 MMOL/ML IV SOLN
10.0000 mL | Freq: Once | INTRAVENOUS | Status: AC | PRN
  Administered 2022-06-13: 10 mL via INTRAVENOUS

## 2022-06-14 LAB — URINE CULTURE

## 2022-06-19 ENCOUNTER — Other Ambulatory Visit: Payer: Self-pay | Admitting: Adult Health

## 2022-06-19 MED ORDER — FLUCONAZOLE 150 MG PO TABS: ORAL_TABLET | ORAL | 1 refills | Status: DC

## 2022-06-19 NOTE — Progress Notes (Signed)
Rx sent for diflucan

## 2022-06-20 ENCOUNTER — Ambulatory Visit
Admission: RE | Admit: 2022-06-20 | Discharge: 2022-06-20 | Disposition: A | Discharge status: Home / Self Care | Payer: Medicaid Other | Source: Ambulatory Visit | Attending: Adult Health | Admitting: Adult Health

## 2022-06-20 DIAGNOSIS — R9389 Abnormal findings on diagnostic imaging of other specified body structures: Secondary | ICD-10-CM

## 2022-06-20 MED ORDER — GADOPICLENOL 0.5 MMOL/ML IV SOLN
10.0000 mL | Freq: Once | INTRAVENOUS | Status: AC | PRN
  Administered 2022-06-20: 10 mL via INTRAVENOUS

## 2022-07-11 ENCOUNTER — Ambulatory Visit: Payer: Self-pay | Admitting: General Surgery

## 2022-07-11 DIAGNOSIS — N6091 Unspecified benign mammary dysplasia of right breast: Secondary | ICD-10-CM

## 2022-07-13 ENCOUNTER — Other Ambulatory Visit: Payer: Self-pay | Admitting: General Surgery

## 2022-07-13 DIAGNOSIS — N6091 Unspecified benign mammary dysplasia of right breast: Secondary | ICD-10-CM

## 2022-07-30 ENCOUNTER — Encounter (HOSPITAL_BASED_OUTPATIENT_CLINIC_OR_DEPARTMENT_OTHER): Payer: Self-pay | Admitting: General Surgery

## 2022-07-30 ENCOUNTER — Other Ambulatory Visit: Payer: Self-pay

## 2022-08-02 ENCOUNTER — Encounter: Payer: Self-pay | Admitting: Genetic Counselor

## 2022-08-02 ENCOUNTER — Inpatient Hospital Stay: Payer: Medicaid Other | Attending: Genetic Counselor | Admitting: Genetic Counselor

## 2022-08-02 ENCOUNTER — Other Ambulatory Visit: Payer: Self-pay | Admitting: Genetic Counselor

## 2022-08-02 ENCOUNTER — Inpatient Hospital Stay (HOSPITAL_BASED_OUTPATIENT_CLINIC_OR_DEPARTMENT_OTHER): Payer: Medicaid Other | Admitting: Hematology and Oncology

## 2022-08-02 ENCOUNTER — Inpatient Hospital Stay: Payer: Medicaid Other

## 2022-08-02 ENCOUNTER — Encounter: Payer: Self-pay | Admitting: Hematology and Oncology

## 2022-08-02 VITALS — BP 142/93 | HR 60 | Temp 97.9°F | Resp 16 | Ht 63.0 in | Wt 220.6 lb

## 2022-08-02 DIAGNOSIS — N6091 Unspecified benign mammary dysplasia of right breast: Secondary | ICD-10-CM | POA: Insufficient documentation

## 2022-08-02 DIAGNOSIS — N6311 Unspecified lump in the right breast, upper outer quadrant: Secondary | ICD-10-CM

## 2022-08-02 DIAGNOSIS — Z803 Family history of malignant neoplasm of breast: Secondary | ICD-10-CM

## 2022-08-02 DIAGNOSIS — Z8049 Family history of malignant neoplasm of other genital organs: Secondary | ICD-10-CM | POA: Insufficient documentation

## 2022-08-02 DIAGNOSIS — Z8481 Family history of carrier of genetic disease: Secondary | ICD-10-CM | POA: Insufficient documentation

## 2022-08-02 DIAGNOSIS — Z9189 Other specified personal risk factors, not elsewhere classified: Secondary | ICD-10-CM

## 2022-08-02 LAB — GENETIC SCREENING ORDER

## 2022-08-02 MED ORDER — TAMOXIFEN CITRATE 10 MG PO TABS: 5.0000 mg | ORAL_TABLET | Freq: Every day | ORAL | 3 refills | Status: DC

## 2022-08-02 NOTE — Progress Notes (Addendum)
REFERRING PROVIDER: Griselda Miner, MD [address] [place],  [place] [address]  PRIMARY PROVIDER:  Dois Davenport, MD  PRIMARY REASON FOR VISIT:  1. Family history of uterine cancer   2. Family history of BRCA1 gene positive   3. Mass of upper outer quadrant of right breast   4. FH: breast cancer in first degree relative      HISTORY OF PRESENT ILLNESS:   Ms. Landini, a 42 y.o. female, was seen for a [place] cancer genetics consultation at the request of Dr. Carolynne Edouard due to a family history of breast cancer and known BRCA1 mutation in the family.  Ms. Griffiths presents to clinic today to discuss the possibility of a hereditary predisposition to cancer, genetic testing, and to further clarify her future cancer risks, as well as potential cancer risks for family members.   Ms. Michela is a 42 y.o. female with no personal history of cancer.  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  CANCER HISTORY:  Oncology History   No history exists.     RISK FACTORS:  Menarche was at age 66.  First live birth at age 32.  OCP use for approximately 8 years.  Ovaries intact: yes.  Hysterectomy: no.  Menopausal status: premenopausal.  HRT use: 0 years. Colonoscopy: no; not examined. Mammogram within the last year: yes. Number of breast biopsies: 1. Up to date with pelvic exams: yes. 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    Hematuria 04/21/2013   History of abnormal cervical Pap smear    History of HPV infection    Hypertension    IBS (irritable bowel syndrome)    Left ACL tear    Migraine    OA (osteoarthritis) of knee    Obesity    Pain of joint of left ankle and foot 10/08/2019   PCO (polycystic ovaries)    Pelvic pain 09/16/2018   S/P ACL reconstruction 03/22/2015   Stiffness of left knee 09/25/2021   Vaginal Pap smear, abnormal    Vitamin D deficiency     Past Surgical History:  Procedure Laterality Date   CARPAL TUNNEL RELEASE Right 04/28/2021   CERVICAL BIOPSY  W/ LOOP ELECTRODE EXCISION     CESAREAN SECTION  11/14/2004   EXAM UNDER ANESTHESIA WITH MANIPULATION OF KNEE Left 03/22/2015   Procedure: LEFT KNEE EXAM UNDER ANESTHESIA  ;  Surgeon: Eugenia Mcalpine, MD;  Location: [facility] [place];  Service: Orthopedics;  Laterality: Left;   HAND WOUND EXPLORATION AND TENDON REPAIR'S Right 10/12/2014   right ring and index fingers   KNEE ARTHROSCOPY W/ PARTIAL MEDIAL MENISCECTOMY Left 09/23/2021   KNEE ARTHROSCOPY WITH ANTERIOR CRUCIATE LIGAMENT (ACL) REPAIR WITH HAMSTRING GRAFT Left 03/22/2015   Procedure: LEFT KNEE ARTHROSCOPY WITH ANTERIOR CRUCIATE LIGAMENT (ACL) AUTOGRAFT RECONSTRUCTION;  Surgeon: Eugenia Mcalpine, MD;  Location: [facility] [place];  Service:  Orthopedics;  Laterality: Left;  ANESTHESIA: GENERAL/ABDUCTOR CANAL BLOCK   KNEE SURGERY  09/2021   LAPAROSCOPIC CHOLECYSTECTOMY  12/20/2004   LAPAROSCOPIC GASTRIC BAND REMOVAL WITH LAPAROSCOPIC GASTRIC SLEEVE RESECTION  09/09/2017   MENISECTOMY Left 03/22/2015   Procedure: PARTIAL LEFT KNEE  LATERAL MENISECTOMY, CHONDROPLASTY;  Surgeon: Eugenia Mcalpine, MD;  Location: [facility];  Service: Orthopedics;  Laterality: Left;   NERVE, TENDON AND ARTERY REPAIR Right 10/12/2014   Procedure: RIGHT RING FINGER AND RIGHT SMALL FINGER WOUND EXPLORATION WITH TENDON REPAIRS;  Surgeon: Bradly Bienenstock, MD;  Location: [facility];   Service: Orthopedics;  Laterality: Right;   WISDOM TOOTH EXTRACTION      Social History   Socioeconomic History   Marital status: Single    Spouse name: Not on file   Number of children: 1   Years of education: Not on file   Highest education level: Bachelor's degree (e.g., BA, AB, BS)  Occupational History   Not on file  Tobacco Use   Smoking status: Never   Smokeless tobacco: Never  Vaping Use   Vaping Use: Never used  Substance and Sexual Activity   Alcohol use: No   Drug use: No   Sexual activity: Yes    Birth control/protection: I.U.D.  Other Topics Concern   Not on file  Social History Narrative   Not on file   Social Determinants of Health   Financial Resource Strain: Low Risk  (10/23/2021)   Overall Financial Resource Strain (CARDIA)    Difficulty of Paying Living Expenses: Not hard at all  Food Insecurity: Food Insecurity Present (10/23/2021)   Hunger Vital Sign    Worried About Running Out of Food in the Last Year: Sometimes true    Ran Out of Food in the Last Year: Sometimes true  Transportation Needs: No Transportation Needs (10/23/2021)   PRAPARE - Administrator, Civil Service (Medical): No    Lack of Transportation (Non-Medical): No  Physical Activity: Insufficiently Active (10/23/2021)   Exercise Vital Sign    Days of Exercise per Week: 1 day    Minutes of Exercise per Session: 30 min  Stress: Stress Concern Present (10/23/2021)   [employer] of Occupational Health - Occupational Stress Questionnaire    Feeling of Stress : Very much  Social Connections: Socially Isolated (10/23/2021)   Social Connection and Isolation Panel [NHANES]    Frequency of Communication with Friends and Family: More than three times a week    Frequency of Social Gatherings with Friends and Family: More than three times a week    Attends Religious Services: Never    Database administrator or Organizations: No    Attends Banker Meetings: Never     Marital Status: Never married     FAMILY HISTORY:  We obtained a detailed, 4-generation family history.  Significant diagnoses are listed below: Family History  Problem Relation Age of Onset   Hypertension Mother    Breast cancer Mother 66   BRCA 1/2 Mother        BRCA1+   Diabetes Father    Hypertension Father    Other Father        open heart surgery   Anxiety disorder Father    Depression Father    Heart disease Father    Stroke Father    Hypertension Sister    Diabetes Sister    Breast cancer Sister 32       bilateral   Stroke Sister  Anxiety disorder Sister    Depression Sister    Heart disease Sister    BRCA 1/2 Sister        BRCA1   Diabetes Sister    Hypertension Sister    Neuropathy Sister    Obesity Sister    Anxiety disorder Sister    Depression Sister    Hypertension Sister    Thyroid disease Sister    Endometrial cancer Sister 51   Obesity Sister    Thyroid disease Brother    BRCA 1/2 Maternal Grandmother        BRCA1 pos   Breast cancer Maternal Grandmother        bilateral, d. 45   Bone cancer Maternal Grandfather    Diabetes Paternal Grandfather    Anxiety disorder Son    Depression Son    Obesity Son    Cancer Maternal Aunt    Breast cancer Maternal Aunt        bilateral; d. 54   BRCA 1/2 Cousin    Breast cancer Cousin 26       d. 41     The patient has one son who is cancer free. She has a twin sister who is cancer free and reportedly BRCA1 neg.  There is a sister who was diagnosed with uterine cancer in her 36's and has not had genetic testing, and a sister who is BRCA1 positive and had bilateral breast cancer at 76.  The patient's mother is deceased and her father is living.  The patient's mother had bilateral breast cancer at 33 and died at 85.  She reportedly was BRCA1 positive.  She had a brother who was cancer free and a sister who had bilateral breast cancer at 66 and throat cancer.  The maternal grandmother had bilateral breast  cancer in her 60's and died.  The grandfather had bone cancer.  The patient's father has diabetes.  There reportedly is no cancer in his family.  Ms. Heikkila is aware of previous family history of genetic testing for hereditary cancer risks. There is no reported Ashkenazi Jewish ancestry. There is no known consanguinity.  GENETIC COUNSELING ASSESSMENT: Ms. Halls is a 42 y.o. female with a family history of breast and uterine cancer which is somewhat suggestive of a hereditary cancer syndrome and predisposition to cancer given the young ages of onset and known BRCA1 mutation. We, therefore, discussed and recommended the following at today's visit.   DISCUSSION: We discussed that, in general, most cancer is not inherited in families, but instead is sporadic or familial. Sporadic cancers occur by chance and typically happen at older ages (>50 years) as this type of cancer is caused by genetic changes acquired during an individual's lifetime. Some families have more cancers than would be expected by chance; however, the ages or types of cancer are not consistent with a known genetic mutation or known genetic mutations have been ruled out. This type of familial cancer is thought to be due to a combination of multiple genetic, environmental, hormonal, and lifestyle factors. While this combination of factors likely increases the risk of cancer, the exact source of this risk is not currently identifiable or testable.  We discussed that 5 - 10% of breast cancer is hereditary, with most cases associated with BRCA mutations.  There is a known BRCA1 mutation in the family, that the patient reportedly tested negative for at age 56.  We discussed that there is concern for a possible other mutation in the family due  to the young uterine cancer in her sister and the patient's recent diagnosis of ADH.  Approximately 6% of individuals with a known mutation in the family will have an additional hereditary mutation.  There  are other genes that can be associated with hereditary breast cancer syndromes.  These include ATM, CHEK2 and PALB2.  We discussed that testing is beneficial for several reasons including knowing how to follow individuals after completing their treatment, identifying whether potential treatment options such as PARP inhibitors would be beneficial, and understand if other family members could be at risk for cancer and allow them to undergo genetic testing.   We reviewed the characteristics, features and inheritance patterns of hereditary cancer syndromes. We also discussed genetic testing, including the appropriate family members to test, the process of testing, insurance coverage and turn-around-time for results. We discussed the implications of a negative, positive, carrier and/or variant of uncertain significant result. Ms. Goodling  was offered a common hereditary cancer panel (47 genes) and an expanded pan-cancer panel (77 genes). Ms. Marston was informed of the benefits and limitations of each panel, including that expanded pan-cancer panels contain genes that do not have clear management guidelines at this point in time.  We also discussed that as the number of genes included on a panel increases, the chances of variants of uncertain significance increases. Ms. Tomson decided to pursue genetic testing for the Cancer-Next-Expanded+RNA gene panel.   The CancerNext-Expanded gene panel offered by [employer] and includes sequencing and rearrangement analysis for the following 71 genes: AIP, ALK, APC, ATM, BAP1, BARD1, BMPR1A, BRCA1, BRCA2, BRIP1, CDC73, CDH1, CDK4, CDKN1B, CDKN2A, CHEK2, DICER1, FH, FLCN, KIF1B, LZTR1, MAX, MEN1, MET, MLH1, MSH2, MSH6, MUTYH, NF1, NF2, NTHL1, PALB2, PHOX2B, PMS2, POT1, PRKAR1A, PTCH1, PTEN, RAD51C, RAD51D, RB1, RET, SDHA, SDHAF2, SDHB, SDHC, SDHD, SMAD4, SMARCA4, SMARCB1, SMARCE1, STK11, SUFU, TMEM127, TP53, TSC1, TSC2 and VHL (sequencing and deletion/duplication); AXIN2,  CTNNA1, EGFR, EGLN1, HOXB13, KIT, MITF, MSH3, PDGFRA, POLD1 and POLE (sequencing only); EPCAM and GREM1 (deletion/duplication only). RNA data is routinely analyzed for use in variant interpretation for all genes.   Based on Ms. Biby's family history of cancer, she meets medical criteria for genetic testing. Despite that she meets criteria, she may still have an out of pocket cost. We discussed that if her out of pocket cost for testing is over $100, the laboratory will call and confirm whether she wants to proceed with testing.  If the out of pocket cost of testing is less than $100 she will be billed by the genetic testing laboratory.   We discussed that some people do not want to undergo genetic testing due to fear of genetic discrimination.  The Genetic Information Nondiscrimination Act (GINA) was signed into federal law in 2008. GINA prohibits health insurers and most employers from discriminating against individuals based on genetic information (including the results of genetic tests and family history information). According to GINA, health insurance companies cannot consider genetic information to be a preexisting condition, nor can they use it to make decisions regarding coverage or rates. GINA also makes it illegal for most employers to use genetic information in making decisions about hiring, firing, promotion, or terms of employment. It is important to note that GINA does not offer protections for life insurance, disability insurance, or long-term care insurance. GINA does not apply to those in the [employer], those who work for companies with less than 15 employees, and new life insurance or long-term disability insurance policies.  Health status due to  a cancer diagnosis is not protected under GINA. More information about GINA can be found by visiting [email or web address].   PLAN: After considering the risks, benefits, and limitations, Ms. Pohle provided informed consent to pursue genetic testing  and the blood sample was sent to [facility] for analysis of the CancerNext-Expanded+RNAinsight. Results should be available within approximately 2-3 weeks' time, at which point they will be disclosed by telephone to Ms. Mccrea, as will any additional recommendations warranted by these results. Ms. Hoelzle will receive a summary of her genetic counseling visit and a copy of her results once available. This information will also be available in Epic.   Lastly, we encouraged Ms. Craggs to remain in contact with cancer genetics annually so that we can continuously update the family history and inform her of any changes in cancer genetics and testing that may be of benefit for this family.   Ms. Kendal questions were answered to her satisfaction today. Our contact information was provided should additional questions or concerns arise. Thank you for the referral and allowing [place] to share in the care of your patient.   Glenette Bookwalter P. Lowell Guitar, MS, [facility] Licensed, Patent attorney [email or web address] phone: [phone]  The patient was seen for a total of 30 minutes in face-to-face genetic counseling.  The patient was seen alone.  Drs. Meliton Rattan, and/or [place] were available for questions, if needed..    _______________________________________________________________________ For Office Staff:  Number of people involved in session: 1 Was an Intern/ student involved with case: no

## 2022-08-02 NOTE — Progress Notes (Signed)
[name] CONSULT NOTE  Patient Care Team: Dois Davenport, MD as PCP - General (Family Medicine) Jeani Hawking, MD as Consulting Physician (Gastroenterology) Suanne Marker, MD as Consulting Physician (Neurology) Eugenia Mcalpine, MD as Consulting Physician (Orthopedic Surgery) Adline Potter, NP as Nurse Practitioner (Obstetrics and Gynecology)  CHIEF COMPLAINTS/PURPOSE OF CONSULTATION:  ADH of right breast.  ASSESSMENT & PLAN:   This is a very pleasant 42 yr old female patient with strong FH in mom, sister, maternal grandmother with [facility] referred to high risk breast clinic because of ADH.  Pathology review: I discussed the difference between atypical ductal hyperplasia, DCIS and invasive breast cancer. I explained to her that atypical ductal hyperplasia  is characterized by a proliferation of uniform epithelial cells filling part, but not the entirety, of the involved duct. ADH is associated with an increased risk of both ipsilateral and contralateral breast cancer and thus provides evidence of underlying breast abnormalities that predispose to breast cancer.   Prognosis:Using the TC model calculated, life time risk calculated is around 36%.  I discussed the risks and benefits of tamoxifen therapy including and not limited to postmenopausal symptoms such as hot flashes, vaginal discharge, increased risk of DVT/PE, endometrial hyperplasia/endometrial carcinoma and benefit on bone density. I believe that the risks far outweigh any benefits from tamoxifen. Instead I recommended the following 1. Exercise 30 minutes daily or 150 minutes a week. 2. Weight loss 3. Increasing fruits and vegetables and less red meat I believe all of the above would extensively decrease her risk of breast cancer as well as other cancers including cancers of the colon substantially.  Surveillance plan: Annual mammograms alternating with MRI. She is interested in this approach  Genetic  testing neg for BRCA 1 but extended panel testing is pending. I have clearly instructed her to wait until mid June before initiating tamoxifen.  She expressed understanding.  She understands the risk of DVT in the perioperative setting.  HISTORY OF PRESENTING ILLNESS:  Kathy White 42 y.o. female is here because of ADH of right breast. She was getting MRI screening of the breast.  At baseline she has some mild arthritis, multiple surgical repairs related to arthritis, hypertension, obesity.  She is currently taking bupropion for weight loss, previously was tried on phentermine.  She has a very strong family history of breast cancer and multiple family members with BRCA1 although she tested negative back when she was 20.  Her mother, her sister as well as her maternal grandmother had breast cancer.  She was undergoing a intensified breast cancer screening with an MRI when she was found to have this ADH.  She denies any personal history of DVT/PE or endometrial problems.  Rest of the pertinent 10 point ROS reviewed and negative  REVIEW OF SYSTEMS:   Constitutional: Denies fevers, chills or abnormal night sweats Eyes: Denies blurriness of vision, double vision or watery eyes Ears, nose, mouth, throat, and face: Denies mucositis or sore throat Respiratory: Denies cough, dyspnea or wheezes Cardiovascular: Denies palpitation, chest discomfort or lower extremity swelling Gastrointestinal:  Denies nausea, heartburn or change in bowel habits Skin: Denies abnormal skin rashes Lymphatics: Denies new lymphadenopathy or easy bruising Neurological:Denies numbness, tingling or new weaknesses Behavioral/Psych: Mood is stable, no new changes  All other systems were reviewed with the patient and are negative.  MEDICAL HISTORY:  Past Medical History:  Diagnosis Date   Acute meniscal tear of left knee    Allergy    Any  narcotics pain medications, adhesive tape   Benign essential HTN 01/13/2014   BRCA  negative 09/14/2015   Family history of BRCA1 gene positive    Family history of breast cancer    Family history of uterine cancer    FH: breast cancer in first degree relative 02/26/2013   Fibroid 09/15/2015   GERD (gastroesophageal reflux disease) 03/12/2018   More frequent after gastric sleeve surgery   Hematuria 04/21/2013   History of abnormal cervical Pap smear    History of HPV infection    Hypertension    IBS (irritable bowel syndrome)    Left ACL tear    Migraine    OA (osteoarthritis) of knee    Obesity    Pain of joint of left ankle and foot 10/08/2019   PCO (polycystic ovaries)    Pelvic pain 09/16/2018   S/P ACL reconstruction 03/22/2015   Stiffness of left knee 09/25/2021   Vaginal Pap smear, abnormal    Vitamin D deficiency     SURGICAL HISTORY: Past Surgical History:  Procedure Laterality Date   CARPAL TUNNEL RELEASE Right 04/28/2021   CERVICAL BIOPSY  W/ LOOP ELECTRODE EXCISION     CESAREAN SECTION  11/14/2004   EXAM UNDER ANESTHESIA WITH MANIPULATION OF KNEE Left 03/22/2015   Procedure: LEFT KNEE EXAM UNDER ANESTHESIA  ;  Surgeon: Eugenia Mcalpine, MD;  Location: [facility];  Service: Orthopedics;  Laterality: Left;   HAND WOUND EXPLORATION AND TENDON REPAIR'S Right 10/12/2014   right ring and index fingers   KNEE ARTHROSCOPY W/ PARTIAL MEDIAL MENISCECTOMY Left 09/23/2021   KNEE ARTHROSCOPY WITH ANTERIOR CRUCIATE LIGAMENT (ACL) REPAIR WITH HAMSTRING GRAFT Left 03/22/2015   Procedure: LEFT KNEE ARTHROSCOPY WITH ANTERIOR CRUCIATE LIGAMENT (ACL) AUTOGRAFT RECONSTRUCTION;  Surgeon: Eugenia Mcalpine, MD;  Location: [facility];  Service: Orthopedics;  Laterality: Left;  ANESTHESIA: GENERAL/ABDUCTOR CANAL BLOCK   KNEE SURGERY  09/2021   LAPAROSCOPIC CHOLECYSTECTOMY  12/20/2004   LAPAROSCOPIC GASTRIC BAND REMOVAL WITH LAPAROSCOPIC GASTRIC SLEEVE RESECTION  09/09/2017   MENISECTOMY Left 03/22/2015   Procedure: PARTIAL LEFT KNEE  LATERAL  MENISECTOMY, CHONDROPLASTY;  Surgeon: Eugenia Mcalpine, MD;  Location: [facility];  Service: Orthopedics;  Laterality: Left;   NERVE, TENDON AND ARTERY REPAIR Right 10/12/2014   Procedure: RIGHT RING FINGER AND RIGHT SMALL FINGER WOUND EXPLORATION WITH TENDON REPAIRS;  Surgeon: Bradly Bienenstock, MD;  Location: [facility] OR;  Service: Orthopedics;  Laterality: Right;   WISDOM TOOTH EXTRACTION      SOCIAL HISTORY: Social History   Socioeconomic History   Marital status: Single    Spouse name: Not on file   Number of children: 1   Years of education: Not on file   Highest education level: Bachelor's degree (e.g., BA, AB, BS)  Occupational History   Not on file  Tobacco Use   Smoking status: Never   Smokeless tobacco: Never  Vaping Use   Vaping Use: Never used  Substance and Sexual Activity   Alcohol use: No   Drug use: No   Sexual activity: Yes    Birth control/protection: I.U.D.  Other Topics Concern   Not on file  Social History Narrative   Not on file   Social Determinants of Health   Financial Resource Strain: Low Risk  (10/23/2021)   Overall Financial Resource Strain (CARDIA)    Difficulty of Paying Living Expenses: Not hard at all  Food Insecurity: Food Insecurity Present (10/23/2021)   Hunger Vital Sign    Worried About  Running Out of Food in the Last Year: Sometimes true    Ran Out of Food in the Last Year: Sometimes true  Transportation Needs: No Transportation Needs (10/23/2021)   PRAPARE - Administrator, Civil Service (Medical): No    Lack of Transportation (Non-Medical): No  Physical Activity: Insufficiently Active (10/23/2021)   Exercise Vital Sign    Days of Exercise per Week: 1 day    Minutes of Exercise per Session: 30 min  Stress: Stress Concern Present (10/23/2021)   [employer] of Occupational Health - Occupational Stress Questionnaire    Feeling of Stress : Very much  Social Connections: Socially Isolated (10/23/2021)   Social  Connection and Isolation Panel [NHANES]    Frequency of Communication with Friends and Family: More than three times a week    Frequency of Social Gatherings with Friends and Family: More than three times a week    Attends Religious Services: Never    Database administrator or Organizations: No    Attends Banker Meetings: Never    Marital Status: Never married  Intimate Partner Violence: Not At Risk (10/23/2021)   Humiliation, Afraid, Rape, and Kick questionnaire    Fear of Current or Ex-Partner: No    Emotionally Abused: No    Physically Abused: No    Sexually Abused: No    FAMILY HISTORY: Family History  Problem Relation Age of Onset   Hypertension Mother    Breast cancer Mother 73   BRCA 1/2 Mother        BRCA1+   Diabetes Father    Hypertension Father    Other Father        open heart surgery   Anxiety disorder Father    Depression Father    Heart disease Father    Stroke Father    Hypertension Sister    Diabetes Sister    Breast cancer Sister 19       bilateral   Stroke Sister    Anxiety disorder Sister    Depression Sister    Heart disease Sister    BRCA 1/2 Sister        BRCA1   Diabetes Sister    Hypertension Sister    Neuropathy Sister    Obesity Sister    Anxiety disorder Sister    Depression Sister    Hypertension Sister    Thyroid disease Sister    Endometrial cancer Sister 18   Obesity Sister    Thyroid disease Brother    BRCA 1/2 Maternal Grandmother        BRCA1 pos   Breast cancer Maternal Grandmother        bilateral, d. 51   Bone cancer Maternal Grandfather    Diabetes Paternal Grandfather    Anxiety disorder Son    Depression Son    Obesity Son    Cancer Maternal Aunt    Breast cancer Maternal Aunt        bilateral; d. 35   BRCA 1/2 Cousin    Breast cancer Cousin 26       d. 59    ALLERGIES:  is allergic to bee venom, hydrocodone, oxycodone-acetaminophen, amlodipine, attends briefs small, percocet  [oxycodone-acetaminophen], and tape.  MEDICATIONS:  Current Outpatient Medications  Medication Sig Dispense Refill   tamoxifen (NOLVADEX) 10 MG tablet Take 0.5 tablets (5 mg total) by mouth daily. 45 tablet 3   budesonide-formoterol (SYMBICORT) 160-4.5 MCG/ACT inhaler As needed.     buPROPion [facility]  XL) 150 MG 24 hr tablet Take 150 mg by mouth daily.     Calcium Carb-Cholecalciferol (CALTRATE 600+D3 PO) Take 1 tablet by mouth daily.     carvedilol (COREG) 12.5 MG tablet Take 1 tablet (12.5 mg total) by mouth 2 (two) times daily with a meal. 180 tablet 3   cetirizine (ZYRTEC) 10 MG tablet Take by mouth.     cholecalciferol (VITAMIN D) 1000 units tablet Take 4,000 Units by mouth daily.     Cranberry-Vitamin C-Probiotic (AZO CRANBERRY PO) Take 2 capsules by mouth daily in the afternoon.     cyclobenzaprine (FLEXERIL) 10 MG tablet As needed.     EPINEPHrine 0.3 mg/0.3 mL IJ SOAJ injection epinephrine 0.3 mg/0.3 mL injection, auto-injector     famotidine (PEPCID) 40 MG tablet Take 40 mg by mouth daily.     fluticasone (FLONASE) 50 MCG/ACT nasal spray Place 2 sprays into the nose as needed for allergies.      gabapentin (NEURONTIN) 400 MG capsule Take 400 mg by mouth at bedtime.     hydrochlorothiazide (HYDRODIURIL) 25 MG tablet Take 1 tablet by mouth daily.     hydrOXYzine (ATARAX/VISTARIL) 10 MG tablet hydroxyzine HCl 10 mg tablet     ipratropium (ATROVENT) 0.06 % nasal spray 2 sprays in each nostril     JANUVIA 50 MG tablet Take 50 mg by mouth daily.     levonorgestrel (MIRENA) 20 MCG/24HR IUD 1 each by Intrauterine route once.     linaclotide (LINZESS) 145 MCG CAPS capsule Take 145 mcg by mouth. As needed.     losartan (COZAAR) 100 MG tablet Take 1 tablet by mouth daily.     meloxicam (MOBIC) 7.5 MG tablet As needed.     montelukast (SINGULAIR) 10 MG tablet Take 1 tablet by mouth daily.     Multiple Vitamins-Calcium (ONE-A-DAY WOMENS PO) Take by mouth daily.     naltrexone (DEPADE) 50  MG tablet Take 25 mg by mouth daily.     traZODone (DESYREL) 50 MG tablet Take 50 mg by mouth at bedtime.     triamcinolone lotion (KENALOG) 0.1 %      No current facility-administered medications for this visit.     PHYSICAL EXAMINATION: ECOG PERFORMANCE STATUS: 0 - Asymptomatic  Vitals:   08/02/22 0959  BP: (!) 142/93  Pulse: 60  Resp: 16  Temp: 97.9 F (36.6 C)  SpO2: 100%   Filed Weights   08/02/22 0959  Weight: 220 lb 9.6 oz (100.1 kg)    GENERAL:alert, no distress and comfortable Breast: Bilateral breasts inspected and palpated.  Heterogeneously dense breasts.  No overtly palpable masses or regional adenopathy.  Biopsy site noted.    LABORATORY DATA:  I have reviewed the data as listed Lab Results  Component Value Date   WBC 14.0 (H) 12/25/2021   HGB 12.7 12/25/2021   HCT 38.8 12/25/2021   MCV 88.2 12/25/2021   PLT 280 12/25/2021     Chemistry      Component Value Date/Time   NA 139 12/25/2021 2224   K 3.5 12/25/2021 2224   CL 103 12/25/2021 2224   CO2 27 12/25/2021 2224   BUN 16 12/25/2021 2224   CREATININE 0.67 12/25/2021 2224      Component Value Date/Time   CALCIUM 9.1 12/25/2021 2224   ALKPHOS 79 12/25/2021 2224   AST 97 (H) 12/25/2021 2224   ALT 64 (H) 12/25/2021 2224   BILITOT 0.8 12/25/2021 2224       RADIOGRAPHIC STUDIES:  I have personally reviewed the radiological images as listed and agreed with the findings in the report. No results found.  All questions were answered. The patient knows to call the clinic with any problems, questions or concerns. I spent 45 minutes in the care of this patient including H and P, review of records, counseling and coordination of care.     Rachel Moulds, MD 08/02/2022 12:59 PM

## 2022-08-07 ENCOUNTER — Encounter (HOSPITAL_BASED_OUTPATIENT_CLINIC_OR_DEPARTMENT_OTHER)
Admission: RE | Admit: 2022-08-07 | Discharge: 2022-08-07 | Disposition: A | Discharge status: Home / Self Care | Payer: Medicaid Other | Source: Ambulatory Visit | Attending: General Surgery | Admitting: General Surgery

## 2022-08-07 ENCOUNTER — Ambulatory Visit
Admission: RE | Admit: 2022-08-07 | Discharge: 2022-08-07 | Disposition: A | Discharge status: Home / Self Care | Payer: Medicaid Other | Source: Ambulatory Visit | Attending: General Surgery | Admitting: General Surgery

## 2022-08-07 DIAGNOSIS — N6091 Unspecified benign mammary dysplasia of right breast: Secondary | ICD-10-CM

## 2022-08-07 DIAGNOSIS — I10 Essential (primary) hypertension: Secondary | ICD-10-CM | POA: Diagnosis not present

## 2022-08-07 DIAGNOSIS — Z01810 Encounter for preprocedural cardiovascular examination: Secondary | ICD-10-CM | POA: Diagnosis present

## 2022-08-07 DIAGNOSIS — M1990 Unspecified osteoarthritis, unspecified site: Secondary | ICD-10-CM | POA: Diagnosis not present

## 2022-08-07 DIAGNOSIS — R7303 Prediabetes: Secondary | ICD-10-CM | POA: Diagnosis not present

## 2022-08-07 DIAGNOSIS — Z79899 Other long term (current) drug therapy: Secondary | ICD-10-CM | POA: Diagnosis not present

## 2022-08-07 DIAGNOSIS — Z803 Family history of malignant neoplasm of breast: Secondary | ICD-10-CM | POA: Diagnosis not present

## 2022-08-07 DIAGNOSIS — N6021 Fibroadenosis of right breast: Secondary | ICD-10-CM | POA: Diagnosis not present

## 2022-08-07 DIAGNOSIS — Z951 Presence of aortocoronary bypass graft: Secondary | ICD-10-CM | POA: Diagnosis not present

## 2022-08-07 HISTORY — PX: BREAST BIOPSY: SHX20

## 2022-08-07 LAB — BASIC METABOLIC PANEL
Anion gap: 7 (ref 5–15)
BUN: 8 mg/dL (ref 6–20)
CO2: 24 mmol/L (ref 22–32)
Calcium: 8.7 mg/dL — ABNORMAL LOW (ref 8.9–10.3)
Chloride: 104 mmol/L (ref 98–111)
Creatinine, Ser: 0.83 mg/dL (ref 0.44–1.00)
GFR, Estimated: >60 mL/min (ref >60)
Glucose, Bld: 103 mg/dL — ABNORMAL HIGH (ref 70–99)
Potassium: 3.5 mmol/L (ref 3.5–5.1)
Sodium: 135 mmol/L (ref 135–145)

## 2022-08-07 MED ORDER — CHLORHEXIDINE GLUCONATE CLOTH 2 % EX PADS: 6.0000 | MEDICATED_PAD | Freq: Once | CUTANEOUS | Status: DC

## 2022-08-07 NOTE — Progress Notes (Signed)

## 2022-08-07 NOTE — Progress Notes (Signed)
Surgical soap given with instructions, pt verbalized understanding.  

## 2022-08-07 NOTE — Anesthesia Preprocedure Evaluation (Signed)
Anesthesia Evaluation  Patient identified by MRN, date of birth, ID band Patient awake    Reviewed: Allergy & Precautions, NPO status , Patient's Chart, lab work & pertinent test results  History of Anesthesia Complications Negative for: history of anesthetic complications  Airway Mallampati: III  TM Distance: >3 FB Neck ROM: Full    Dental  (+) Dental Advisory Given   Pulmonary    Pulmonary exam normal breath sounds clear to auscultation       Cardiovascular hypertension (carvedilol, HCTZ, losartan), Pt. on home beta blockers (-) angina (-) Past MI, (-) Cardiac Stents and (-) CABG (-) dysrhythmias  Rhythm:Regular Rate:Normal     Neuro/Psych  Headaches, neg Seizures    GI/Hepatic Neg liver ROS,GERD  Medicated,,S/p laparoscopic sleeve gastrectomy   Endo/Other  Pre-DM  Renal/GU negative Renal ROS     Musculoskeletal  (+) Arthritis , Osteoarthritis,    Abdominal  (+) + obese  Peds  Hematology negative hematology ROS (+)   Anesthesia Other Findings Right breast ADH  Reproductive/Obstetrics                             Anesthesia Physical Anesthesia Plan  ASA: 2  Anesthesia Plan: General   Post-op Pain Management: Tylenol PO (pre-op)*   Induction: Intravenous  PONV Risk Score and Plan: 3 and Propofol infusion, TIVA, Ondansetron, Dexamethasone and Treatment may vary due to age or medical condition  Airway Management Planned: LMA  Additional Equipment:   Intra-op Plan:   Post-operative Plan: Extubation in OR  Informed Consent: I have reviewed the patients History and Physical, chart, labs and discussed the procedure including the risks, benefits and alternatives for the proposed anesthesia with the patient or authorized representative who has indicated his/her understanding and acceptance.     Dental advisory given  Plan Discussed with: CRNA and Anesthesiologist  Anesthesia  Plan Comments: (Risks of general anesthesia discussed including, but not limited to, sore throat, hoarse voice, chipped/damaged teeth, injury to vocal cords, nausea and vomiting, allergic reactions, lung infection, heart attack, stroke, and death. All questions answered. )        Anesthesia Quick Evaluation

## 2022-08-08 ENCOUNTER — Ambulatory Visit
Admission: RE | Admit: 2022-08-08 | Discharge: 2022-08-08 | Disposition: A | Discharge status: Home / Self Care | Payer: Medicaid Other | Source: Ambulatory Visit | Attending: General Surgery | Admitting: General Surgery

## 2022-08-08 ENCOUNTER — Encounter (HOSPITAL_BASED_OUTPATIENT_CLINIC_OR_DEPARTMENT_OTHER): Payer: Self-pay | Admitting: General Surgery

## 2022-08-08 ENCOUNTER — Other Ambulatory Visit: Payer: Self-pay

## 2022-08-08 ENCOUNTER — Ambulatory Visit (HOSPITAL_BASED_OUTPATIENT_CLINIC_OR_DEPARTMENT_OTHER)
Admission: RE | Admit: 2022-08-08 | Discharge: 2022-08-08 | Disposition: A | Discharge status: Home / Self Care | Payer: Medicaid Other | Attending: General Surgery | Admitting: General Surgery

## 2022-08-08 ENCOUNTER — Ambulatory Visit (HOSPITAL_BASED_OUTPATIENT_CLINIC_OR_DEPARTMENT_OTHER): Payer: Medicaid Other | Admitting: Anesthesiology

## 2022-08-08 ENCOUNTER — Encounter (HOSPITAL_BASED_OUTPATIENT_CLINIC_OR_DEPARTMENT_OTHER)
Admission: RE | Disposition: A | Discharge status: Home / Self Care | Payer: Self-pay | Source: Home / Self Care | Attending: General Surgery

## 2022-08-08 DIAGNOSIS — E669 Obesity, unspecified: Secondary | ICD-10-CM | POA: Diagnosis not present

## 2022-08-08 DIAGNOSIS — N6021 Fibroadenosis of right breast: Secondary | ICD-10-CM | POA: Insufficient documentation

## 2022-08-08 DIAGNOSIS — I10 Essential (primary) hypertension: Secondary | ICD-10-CM

## 2022-08-08 DIAGNOSIS — N6091 Unspecified benign mammary dysplasia of right breast: Secondary | ICD-10-CM

## 2022-08-08 DIAGNOSIS — Z79899 Other long term (current) drug therapy: Secondary | ICD-10-CM | POA: Insufficient documentation

## 2022-08-08 DIAGNOSIS — Z951 Presence of aortocoronary bypass graft: Secondary | ICD-10-CM | POA: Insufficient documentation

## 2022-08-08 DIAGNOSIS — Z6838 Body mass index (BMI) 38.0-38.9, adult: Secondary | ICD-10-CM

## 2022-08-08 DIAGNOSIS — Z803 Family history of malignant neoplasm of breast: Secondary | ICD-10-CM | POA: Insufficient documentation

## 2022-08-08 DIAGNOSIS — M1990 Unspecified osteoarthritis, unspecified site: Secondary | ICD-10-CM | POA: Insufficient documentation

## 2022-08-08 DIAGNOSIS — R7303 Prediabetes: Secondary | ICD-10-CM | POA: Insufficient documentation

## 2022-08-08 DIAGNOSIS — Z01818 Encounter for other preprocedural examination: Secondary | ICD-10-CM

## 2022-08-08 HISTORY — PX: BREAST LUMPECTOMY WITH RADIOACTIVE SEED LOCALIZATION: SHX6424

## 2022-08-08 LAB — POCT PREGNANCY, URINE: Preg Test, Ur: NEGATIVE

## 2022-08-08 SURGERY — BREAST LUMPECTOMY WITH RADIOACTIVE SEED LOCALIZATION
Anesthesia: General | Site: Breast | Laterality: Right

## 2022-08-08 MED ORDER — FENTANYL CITRATE (PF) 100 MCG/2ML IJ SOLN
INTRAMUSCULAR | Status: AC
  Filled 2022-08-08: qty 2

## 2022-08-08 MED ORDER — GABAPENTIN 300 MG PO CAPS
300.0000 mg | ORAL_CAPSULE | ORAL | Status: AC
  Administered 2022-08-08: 300 mg via ORAL

## 2022-08-08 MED ORDER — CEFAZOLIN SODIUM-DEXTROSE 2-4 GM/100ML-% IV SOLN: 2.0000 g | INTRAVENOUS | Status: DC

## 2022-08-08 MED ORDER — LACTATED RINGERS IV SOLN: INTRAVENOUS | Status: DC | PRN

## 2022-08-08 MED ORDER — FENTANYL CITRATE (PF) 100 MCG/2ML IJ SOLN
INTRAMUSCULAR | Status: DC | PRN
  Administered 2022-08-08: 50 ug via INTRAVENOUS
  Administered 2022-08-08: 100 ug via INTRAVENOUS
  Administered 2022-08-08: 50 ug via INTRAVENOUS

## 2022-08-08 MED ORDER — PROMETHAZINE HCL 25 MG/ML IJ SOLN: 6.2500 mg | INTRAMUSCULAR | Status: DC | PRN

## 2022-08-08 MED ORDER — KETOROLAC TROMETHAMINE 30 MG/ML IJ SOLN: 30.0000 mg | Freq: Once | INTRAMUSCULAR | Status: DC | PRN

## 2022-08-08 MED ORDER — DEXMEDETOMIDINE HCL IN NACL 80 MCG/20ML IV SOLN
INTRAVENOUS | Status: DC | PRN
  Administered 2022-08-08: 8 ug via INTRAVENOUS
  Administered 2022-08-08: 12 ug via INTRAVENOUS

## 2022-08-08 MED ORDER — LIDOCAINE HCL (CARDIAC) PF 100 MG/5ML IV SOSY
PREFILLED_SYRINGE | INTRAVENOUS | Status: DC | PRN
  Administered 2022-08-08: 60 mg via INTRAVENOUS

## 2022-08-08 MED ORDER — CEFAZOLIN SODIUM-DEXTROSE 2-3 GM-%(50ML) IV SOLR
INTRAVENOUS | Status: DC | PRN
  Administered 2022-08-08: 2 g via INTRAVENOUS

## 2022-08-08 MED ORDER — LACTATED RINGERS IV SOLN: INTRAVENOUS | Status: DC

## 2022-08-08 MED ORDER — CELECOXIB 200 MG PO CAPS
200.0000 mg | ORAL_CAPSULE | ORAL | Status: AC
  Administered 2022-08-08: 200 mg via ORAL

## 2022-08-08 MED ORDER — FENTANYL CITRATE (PF) 100 MCG/2ML IJ SOLN
25.0000 ug | INTRAMUSCULAR | Status: DC | PRN
  Administered 2022-08-08: 50 ug via INTRAVENOUS

## 2022-08-08 MED ORDER — ONDANSETRON HCL 4 MG/2ML IJ SOLN
INTRAMUSCULAR | Status: DC | PRN
  Administered 2022-08-08: 4 mg via INTRAVENOUS

## 2022-08-08 MED ORDER — TRAMADOL HCL 50 MG PO TABS
50.0000 mg | ORAL_TABLET | Freq: Four times a day (QID) | ORAL | 0 refills | Status: DC | PRN
Start: 2022-08-08 — End: 2022-08-23

## 2022-08-08 MED ORDER — BUPIVACAINE-EPINEPHRINE (PF) 0.25% -1:200000 IJ SOLN
INTRAMUSCULAR | Status: DC | PRN
  Administered 2022-08-08: 20 mL

## 2022-08-08 MED ORDER — MIDAZOLAM HCL 2 MG/2ML IJ SOLN
INTRAMUSCULAR | Status: AC
  Filled 2022-08-08: qty 2

## 2022-08-08 MED ORDER — CEFAZOLIN SODIUM-DEXTROSE 2-4 GM/100ML-% IV SOLN
INTRAVENOUS | Status: AC
  Filled 2022-08-08: qty 100

## 2022-08-08 MED ORDER — PROPOFOL 10 MG/ML IV BOLUS
INTRAVENOUS | Status: DC | PRN
  Administered 2022-08-08: 200 mg via INTRAVENOUS

## 2022-08-08 MED ORDER — CELECOXIB 200 MG PO CAPS
ORAL_CAPSULE | ORAL | Status: AC
  Filled 2022-08-08: qty 1

## 2022-08-08 MED ORDER — DEXAMETHASONE SODIUM PHOSPHATE 10 MG/ML IJ SOLN
INTRAMUSCULAR | Status: DC | PRN
  Administered 2022-08-08: 5 mg via INTRAVENOUS

## 2022-08-08 MED ORDER — GABAPENTIN 300 MG PO CAPS
ORAL_CAPSULE | ORAL | Status: AC
  Filled 2022-08-08: qty 1

## 2022-08-08 MED ORDER — PROPOFOL 500 MG/50ML IV EMUL
INTRAVENOUS | Status: DC | PRN
  Administered 2022-08-08: 250 ug/kg/min via INTRAVENOUS

## 2022-08-08 MED ORDER — AMISULPRIDE (ANTIEMETIC) 5 MG/2ML IV SOLN: 10.0000 mg | Freq: Once | INTRAVENOUS | Status: DC | PRN

## 2022-08-08 MED ORDER — MIDAZOLAM HCL 2 MG/2ML IJ SOLN
INTRAMUSCULAR | Status: DC | PRN
  Administered 2022-08-08: 2 mg via INTRAVENOUS

## 2022-08-08 SURGICAL SUPPLY — 40 items
ADH SKN CLS APL DERMABOND .7 (GAUZE/BANDAGES/DRESSINGS) ×1
APL PRP STRL LF DISP 70% ISPRP (MISCELLANEOUS) ×1
APPLIER CLIP 9.375 MED OPEN (MISCELLANEOUS)
APR CLP MED 9.3 20 MLT OPN (MISCELLANEOUS)
BLADE SURG 15 STRL LF DISP TIS (BLADE) ×1 IMPLANT
BLADE SURG 15 STRL SS (BLADE) ×1
CANISTER SUC SOCK COL 7IN (MISCELLANEOUS) ×1 IMPLANT
CANISTER SUCT 1200ML W/VALVE (MISCELLANEOUS) ×1 IMPLANT
CHLORAPREP W/TINT 26 (MISCELLANEOUS) ×1 IMPLANT
CLIP APPLIE 9.375 MED OPEN (MISCELLANEOUS) IMPLANT
COVER BACK TABLE 60X90IN (DRAPES) ×1 IMPLANT
COVER MAYO STAND STRL (DRAPES) ×1 IMPLANT
COVER PROBE CYLINDRICAL 5X96 (MISCELLANEOUS) ×1 IMPLANT
DERMABOND ADVANCED .7 DNX12 (GAUZE/BANDAGES/DRESSINGS) ×1 IMPLANT
DRAPE LAPAROSCOPIC ABDOMINAL (DRAPES) ×1 IMPLANT
DRAPE UTILITY XL STRL (DRAPES) ×1 IMPLANT
ELECT COATED BLADE 2.86 ST (ELECTRODE) ×1 IMPLANT
ELECT REM PT RETURN 9FT ADLT (ELECTROSURGICAL) ×1
ELECTRODE REM PT RTRN 9FT ADLT (ELECTROSURGICAL) ×1 IMPLANT
GLOVE BIO SURGEON STRL SZ 6.5 (GLOVE) IMPLANT
GLOVE BIO SURGEON STRL SZ7.5 (GLOVE) ×2 IMPLANT
GOWN STRL REUS W/ TWL LRG LVL3 (GOWN DISPOSABLE) ×2 IMPLANT
GOWN STRL REUS W/TWL LRG LVL3 (GOWN DISPOSABLE) ×2
KIT MARKER MARGIN INK (KITS) ×1 IMPLANT
NDL HYPO 25X1 1.5 SAFETY (NEEDLE) IMPLANT
NEEDLE HYPO 25X1 1.5 SAFETY (NEEDLE) ×1 IMPLANT
NS IRRIG 1000ML POUR BTL (IV SOLUTION) IMPLANT
PACK BASIN DAY SURGERY FS (CUSTOM PROCEDURE TRAY) ×1 IMPLANT
PENCIL SMOKE EVACUATOR (MISCELLANEOUS) ×1 IMPLANT
SLEEVE SCD COMPRESS KNEE MED (STOCKING) ×1 IMPLANT
SPIKE FLUID TRANSFER (MISCELLANEOUS) IMPLANT
SPONGE T-LAP 18X18 ~~LOC~~+RFID (SPONGE) ×1 IMPLANT
SUT MON AB 4-0 PC3 18 (SUTURE) ×1 IMPLANT
SUT SILK 2 0 SH (SUTURE) IMPLANT
SUT VICRYL 3-0 CR8 SH (SUTURE) ×1 IMPLANT
SYR CONTROL 10ML LL (SYRINGE) IMPLANT
TOWEL GREEN STERILE FF (TOWEL DISPOSABLE) ×1 IMPLANT
TRAY FAXITRON CT DISP (TRAY / TRAY PROCEDURE) ×1 IMPLANT
TUBE CONNECTING 20X1/4 (TUBING) ×1 IMPLANT
YANKAUER SUCT BULB TIP NO VENT (SUCTIONS) IMPLANT

## 2022-08-08 NOTE — Transfer of Care (Signed)
Immediate Anesthesia Transfer of Care Note  Patient: Kathy White  Procedure(s) Performed: RIGHT BREAST LUMPECTOMY WITH RADIOACTIVE SEED LOCALIZATION (Right: Breast)  Patient Location: PACU  Anesthesia Type:General  Level of Consciousness: drowsy and patient cooperative  Airway & Oxygen Therapy: Patient Spontanous Breathing and Patient connected to face mask oxygen  Post-op Assessment: Report given to RN and Post -op Vital signs reviewed and stable  Post vital signs: Reviewed and stable  Last Vitals:  Vitals Value Taken Time  BP 107/64 08/08/22 0950  Temp    Pulse 71 08/08/22 0953  Resp 23 08/08/22 0953  SpO2 97 % 08/08/22 0953  Vitals shown include unvalidated device data.  Last Pain:  Vitals:   08/08/22 0836  TempSrc: Oral  PainSc: 0-No pain      Patients Stated Pain Goal: 7 (08/08/22 0836)  Complications: No notable events documented.

## 2022-08-08 NOTE — Discharge Instructions (Addendum)
  Post Anesthesia Home Care Instructions  Activity: Get plenty of rest for the remainder of the day. A responsible individual must stay with you for 24 hours following the procedure.  For the next 24 hours, DO NOT: -Drive a car -Advertising copywriter -Drink alcoholic beverages -Take any medication unless instructed by your physician -Make any legal decisions or sign important papers.  Meals: Start with liquid foods such as gelatin or soup. Progress to regular foods as tolerated. Avoid greasy, spicy, heavy foods. If nausea and/or vomiting occur, drink only clear liquids until the nausea and/or vomiting subsides. Call your physician if vomiting continues.  Special Instructions/Symptoms: Your throat may feel dry or sore from the anesthesia or the breathing tube placed in your throat during surgery. If this causes discomfort, gargle with warm salt water. The discomfort should disappear within 24 hours.  If you had a scopolamine patch placed behind your ear for the management of post- operative nausea and/or vomiting:  1. The medication in the patch is effective for 72 hours, after which it should be removed.  Wrap patch in a tissue and discard in the trash. Wash hands thoroughly with soap and water. 2. You may remove the patch earlier than 72 hours if you experience unpleasant side effects which may include dry mouth, dizziness or visual disturbances. 3. Avoid touching the patch. Wash your hands with soap and water after contact with the patch.    No NSAIDS (ibuprofen, motrin) until after 3pm today if needed

## 2022-08-08 NOTE — Op Note (Signed)
08/08/2022  9:43 AM  PATIENT:  Kathy White  42 y.o. female  PRE-OPERATIVE DIAGNOSIS:  RIGHT BREAST ADH  POST-OPERATIVE DIAGNOSIS:  RIGHT BREAST ADH  PROCEDURE:  Procedure(s): RIGHT BREAST LUMPECTOMY WITH RADIOACTIVE SEED LOCALIZATION (Right)  SURGEON:  Surgeon(s) and Role:    * Griselda Miner, MD - Primary  PHYSICIAN ASSISTANT:   ASSISTANTS: none   ANESTHESIA:   local and general  EBL:  15 mL   BLOOD ADMINISTERED:none  DRAINS: none   LOCAL MEDICATIONS USED:  MARCAINE     SPECIMEN:  Source of Specimen:  right breast tissue  DISPOSITION OF SPECIMEN:  PATHOLOGY  COUNTS:  YES  TOURNIQUET:  * No tourniquets in log *  DICTATION: .Dragon Dictation  After informed consent was obtained the patient was brought to the operating room and placed in the supine position on the operating table.  After adequate induction of general anesthesia the patient's right breast was prepped with ChloraPrep, allowed to dry, and draped in usual sterile manner.  An appropriate timeout was performed.  Previously an I-125 seed was placed in the upper outer right breast to mark an area of atypical ductal hyperplasia.  The neoprobe was then set to I-125 in the area of radioactivity was readily identified.  The area around this was infiltrated with quarter percent Marcaine.  An elliptical incision was made in the skin overlying the area of radioactivity with a 15 blade knife.  The incision was carried through the skin and subcutaneous tissue sharply with the electrocautery.  Dissection was then carried around the radioactive seed while checking the area of radioactivity frequently.  Once the specimen was removed it was oriented with the appropriate paint colors.  A specimen radiograph was obtained that showed the clip and seed to be near the center of the specimen.  The specimen was then sent to pathology for further evaluation.  Hemostasis was achieved using the Bovie electrocautery.  The wound was  irrigated with saline and infiltrated with more quarter percent Marcaine.  The deep layer of the wound was then closed with layers of interrupted 3-0 Vicryl stitches.  The skin was then closed with a running 4-0 Monocryl subcuticular stitch.  Dermabond dressings were applied.  The patient tolerated the procedure well.  At the end of the case all needle sponge and instrument counts were correct.  The patient was then awakened and taken to recovery in stable condition.  PLAN OF CARE: Discharge to home after PACU  PATIENT DISPOSITION:  PACU - hemodynamically stable.   Delay start of Pharmacological VTE agent (>24hrs) due to surgical blood loss or risk of bleeding: not applicable

## 2022-08-08 NOTE — Anesthesia Postprocedure Evaluation (Signed)
Anesthesia Post Note  Patient: Neriyah L Marts  Procedure(s) Performed: RIGHT BREAST LUMPECTOMY WITH RADIOACTIVE SEED LOCALIZATION (Right: Breast)     Patient location during evaluation: PACU Anesthesia Type: General Level of consciousness: awake Pain management: pain level controlled Vital Signs Assessment: post-procedure vital signs reviewed and stable Respiratory status: spontaneous breathing, nonlabored ventilation and respiratory function stable Cardiovascular status: blood pressure returned to baseline and stable Postop Assessment: no apparent nausea or vomiting Anesthetic complications: no   No notable events documented.  Last Vitals:  Vitals:   08/08/22 1030 08/08/22 1049  BP: (!) 143/93 (!) 155/93  Pulse: 71 72  Resp: 19 14  Temp:  36.7 C  SpO2: 95% 97%    Last Pain:  Vitals:   08/08/22 1049  TempSrc:   PainSc: 2                  Linton Rump

## 2022-08-08 NOTE — Interval H&P Note (Signed)
History and Physical Interval Note:  08/08/2022 8:55 AM  Kathy White  has presented today for surgery, with the diagnosis of RIGHT BREAST ADH.  The various methods of treatment have been discussed with the patient and family. After consideration of risks, benefits and other options for treatment, the patient has consented to  Procedure(s): RIGHT BREAST LUMPECTOMY WITH RADIOACTIVE SEED LOCALIZATION (Right) as a surgical intervention.  The patient's history has been reviewed, patient examined, no change in status, stable for surgery.  I have reviewed the patient's chart and labs.  Questions were answered to the patient's satisfaction.     Chevis Pretty III

## 2022-08-08 NOTE — Anesthesia Procedure Notes (Signed)
Procedure Name: LMA Insertion Date/Time: 08/08/2022 9:27 AM  Performed by: Karen Kitchens, CRNAPre-anesthesia Checklist: Patient identified, Emergency Drugs available, Suction available and Patient being monitored Patient Re-evaluated:Patient Re-evaluated prior to induction Oxygen Delivery Method: Circle system utilized Preoxygenation: Pre-oxygenation with 100% oxygen Induction Type: IV induction Ventilation: Mask ventilation without difficulty LMA: LMA inserted LMA Size: 4.0 Number of attempts: 1 Airway Equipment and Method: Bite block Placement Confirmation: positive ETCO2 Tube secured with: Tape Dental Injury: Teeth and Oropharynx as per pre-operative assessment

## 2022-08-08 NOTE — H&P (Signed)
REFERRING PHYSICIAN: Cyril Mourning Apple* PROVIDER: Lindell Noe, MD MRN: [id number] DOB: 06-30-1980 Subjective   Chief Complaint: No chief complaint on file.  History of Present Illness: Kathy White is a 42 y.o. female who is seen today as an office consultation for evaluation of No chief complaint on file.  We are asked to see the patient in consultation by Dr. Cyril Mourning to evaluate her for atypical ductal hyperplasia of the right breast. The patient is a 42 year old female who has a very strong family history of breast cancer in her mother, grandmother, sister, and aunts. She recently underwent a screening with an MRI because of her high risk. She was found to have an 8 cm area of enhancement in the upper outer quadrant of the right breast. This was biopsied in 2 places. 1 area was benign and the second area showed a focal area of atypical ductal hyperplasia. She had genetic testing when she was 20 but only for the BRCA gene and it was negative by her report. She has other family members who are BRCA1 positive  Review of Systems: A complete review of systems was obtained from the patient. I have reviewed this information and discussed as appropriate with the patient. See HPI as well for other ROS.  ROS   Medical History: History reviewed. No pertinent past medical history.  Patient Active Problem List  Diagnosis  Atypical ductal hyperplasia of right breast   History reviewed. No pertinent surgical history.   Not on File  No current outpatient medications on file prior to visit.   No current facility-administered medications on file prior to visit.   History reviewed. No pertinent family history.   Social History   Tobacco Use  Smoking Status Not on file  Smokeless Tobacco Not on file    Social History   Socioeconomic History  Marital status: Single   Social Determinants of Health   Financial Resource Strain: Low Risk (06/14/2022)  Received from [facility]  Overall Financial Resource Strain (CARDIA)  Difficulty of Paying Living Expenses: Not hard at all  Food Insecurity: No Food Insecurity (06/14/2022)  Received from [facility]  Hunger Vital Sign  Worried About Running Out of Food in the Last Year: Never true  Ran Out of Food in the Last Year: Never true  Transportation Needs: No Transportation Needs (06/14/2022)  Received from [facility] - Transportation  Lack of Transportation (Medical): No  Lack of Transportation (Non-Medical): No  Physical Activity: Insufficiently Active (10/23/2021)  Received from [facility]  Exercise Vital Sign  Days of Exercise per Week: 1 day  Minutes of Exercise per Session: 30 min  Stress: Stress Concern Present (10/23/2021)  Received from [facility] of Occupational [facility] - Occupational Stress Questionnaire  Feeling of Stress : Very much  Social Connections: Socially Isolated (10/23/2021)  Received from [facility]  Social Connection and Isolation Panel [NHANES]  Frequency of Communication with Friends and Family: More than three times a week  Frequency of Social Gatherings with Friends and Family: More than three times a week  Attends Religious Services: Never  Database administrator or Organizations: No  Attends Banker Meetings: Never  Marital Status: Never married   Objective:  There were no vitals filed for this visit.  There is no height or weight on file to calculate BMI.  Physical Exam Vitals reviewed.  Constitutional:  General: She is not in acute distress. Appearance: Normal appearance.  HENT:  Head: Normocephalic and atraumatic.  Right Ear: External ear normal.  Left Ear: External ear normal.  Nose: Nose normal.  Mouth/Throat:  Mouth: Mucous membranes are moist.  Pharynx: Oropharynx is clear.  Eyes:  General: No scleral icterus. Extraocular Movements: Extraocular movements intact.  Conjunctiva/sclera: Conjunctivae normal.   Pupils: Pupils are equal, round, and reactive to light.  Cardiovascular:  Rate and Rhythm: Normal rate and regular rhythm.  Pulses: Normal pulses.  Heart sounds: Normal heart sounds.  Pulmonary:  Effort: Pulmonary effort is normal. No respiratory distress.  Breath sounds: Normal breath sounds.  Abdominal:  General: Bowel sounds are normal.  Palpations: Abdomen is soft.  Tenderness: There is no abdominal tenderness.  Musculoskeletal:  General: No swelling, tenderness or deformity. Normal range of motion.  Cervical back: Normal range of motion and neck supple.  Skin: General: Skin is warm and dry.  Coloration: Skin is not jaundiced.  Neurological:  General: No focal deficit present.  Mental Status: She is alert and oriented to person, place, and time.  Psychiatric:  Mood and Affect: Mood normal.  Behavior: Behavior normal.     Breast: There is no palpable mass in either breast. There is no palpable axillary, supraclavicular, or cervical lymphadenopathy. There is some slight fullness in the upper outer quadrant of the right breast compared to the left  Labs, Imaging and Diagnostic Testing:  Assessment and Plan:   Diagnoses and all orders for this visit:  Atypical ductal hyperplasia of right breast   The patient appears to have a small focal area of atypical ductal hyperplasia in the upper outer quadrant of the right breast. Given that this is a high risk lesion and because its appearance can be similar to preinvasive cancer my recommendation would be to have this area removed. She would also like to have this done. I have discussed with her in detail the risks and benefits of the operation as well as some of the technical aspect including the use of a radioactive seed for localization and she understands and wishes to proceed. I will also refer her to the high risk clinic at the cancer center to talk about risk reduction. I will also refer her to genetics as I think she needs  updated testing. She is not ready to think about bilateral mastectomies for prophylaxis at this point but we did discuss this. We will move forward with surgical scheduling.

## 2022-08-09 ENCOUNTER — Encounter (HOSPITAL_BASED_OUTPATIENT_CLINIC_OR_DEPARTMENT_OTHER): Payer: Self-pay | Admitting: General Surgery

## 2022-08-10 ENCOUNTER — Other Ambulatory Visit: Payer: Self-pay

## 2022-08-10 ENCOUNTER — Encounter (HOSPITAL_BASED_OUTPATIENT_CLINIC_OR_DEPARTMENT_OTHER): Payer: Self-pay | Admitting: Orthopaedic Surgery

## 2022-08-10 LAB — SURGICAL PATHOLOGY

## 2022-08-14 ENCOUNTER — Ambulatory Visit: Payer: Self-pay | Admitting: Genetic Counselor

## 2022-08-14 ENCOUNTER — Encounter: Payer: Self-pay | Admitting: Genetic Counselor

## 2022-08-14 ENCOUNTER — Encounter: Payer: Self-pay | Admitting: Hematology and Oncology

## 2022-08-14 ENCOUNTER — Telehealth: Payer: Self-pay | Admitting: Genetic Counselor

## 2022-08-14 DIAGNOSIS — Z1379 Encounter for other screening for genetic and chromosomal anomalies: Secondary | ICD-10-CM | POA: Insufficient documentation

## 2022-08-14 NOTE — Telephone Encounter (Signed)
Revealed negative genetic testing.  This is a true negative in that there is a known BRCA1 mutation in the family and she does not have it. Continue to do breast cancer screening per her providers recommendation.

## 2022-08-14 NOTE — Progress Notes (Addendum)
HPI:  [name] was previously seen in the  Cancer Genetics clinic due to a family history of cancer and concerns regarding a hereditary predisposition to cancer. Please refer to our prior cancer genetics clinic note for more information regarding our discussion, assessment and recommendations, at the time. [name] recent genetic test results were disclosed to her, as were recommendations warranted by these results. These results and recommendations are discussed in more detail below.  CANCER HISTORY:  Oncology History   No history exists.    FAMILY HISTORY:  We obtained a detailed, 4-generation family history.  Significant diagnoses are listed below: Family History  Problem Relation Age of Onset   Hypertension Mother    Breast cancer Mother 49   BRCA 1/2 Mother        BRCA1+   Diabetes Father    Hypertension Father    Other Father        open heart surgery   Anxiety disorder Father    Depression Father    Heart disease Father    Stroke Father    Hypertension Sister    Diabetes Sister    Breast cancer Sister 25       bilateral   Stroke Sister    Anxiety disorder Sister    Depression Sister    Heart disease Sister    BRCA 1/2 Sister        BRCA1   Diabetes Sister    Hypertension Sister    Neuropathy Sister    Obesity Sister    Anxiety disorder Sister    Depression Sister    Hypertension Sister    Thyroid disease Sister    Endometrial cancer Sister 41   Obesity Sister    Thyroid disease Brother    BRCA 1/2 Maternal Grandmother        BRCA1 pos   Breast cancer Maternal Grandmother        bilateral, d. 32   Bone cancer Maternal Grandfather    Diabetes Paternal Grandfather    Anxiety disorder Son    Depression Son    Obesity Son    Cancer Maternal Aunt    Breast cancer Maternal Aunt        bilateral; d. 24   BRCA 1/2 Cousin    Breast cancer Cousin 26       d. 54       The patient has one son who is cancer free. She has a twin sister who is  cancer free and reportedly BRCA1 neg.  There is a sister who was diagnosed with uterine cancer in her 59's and has not had genetic testing, and a sister who is BRCA1 positive and had bilateral breast cancer at 23.  The patient's mother is deceased and her father is living.   The patient's mother had bilateral breast cancer at 65 and died at 63.  She reportedly was BRCA1 positive.  She had a brother who was cancer free and a sister who had bilateral breast cancer at 76 and throat cancer.  The maternal grandmother had bilateral breast cancer in her 46's and died.  The grandfather had bone cancer.   The patient's father has diabetes.  There reportedly is no cancer in his family.   [name] is aware of previous family history of genetic testing for hereditary cancer risks. There is no reported Ashkenazi Jewish ancestry. There is no known consanguinity  GENETIC TEST RESULTS: Genetic testing reported out on August 13, 2022 through the CancerNext-Expanded+RNAinsight  cancer panel found no pathogenic mutations. The CancerNext-Expanded gene panel offered by [employer] and includes sequencing and rearrangement analysis for the following 71 genes: AIP, ALK, APC, ATM, BAP1, BARD1, BMPR1A, BRCA1, BRCA2, BRIP1, CDC73, CDH1, CDK4, CDKN1B, CDKN2A, CHEK2, DICER1, FH, FLCN, KIF1B, LZTR1, MAX, MEN1, MET, MLH1, MSH2, MSH6, MUTYH, NF1, NF2, NTHL1, PALB2, PHOX2B, PMS2, POT1, PRKAR1A, PTCH1, PTEN, RAD51C, RAD51D, RB1, RET, SDHA, SDHAF2, SDHB, SDHC, SDHD, SMAD4, SMARCA4, SMARCB1, SMARCE1, STK11, SUFU, TMEM127, TP53, TSC1, TSC2 and VHL (sequencing and deletion/duplication); AXIN2, CTNNA1, EGFR, EGLN1, HOXB13, KIT, MITF, MSH3, PDGFRA, POLD1 and POLE (sequencing only); EPCAM and GREM1 (deletion/duplication only). RNA data is routinely analyzed for use in variant interpretation for all genes. [name] test was normal and did not reveal the familial mutation. We call this result a true negative result because the cancer-causing  mutation was identified in [name]'s family, and she did not inherit it.  The test report has been scanned into EPIC and is located under the Molecular Pathology section of the Results Review tab.  A portion of the result report is included below for reference.     We discussed with [name] that because current genetic testing is not perfect, it is possible there may be a gene mutation in one of these genes that current testing cannot detect, but that chance is small.  We also discussed, that there could be another gene that has not yet been discovered, or that we have not yet tested, that is responsible for the cancer diagnoses in the family. It is also possible there is a hereditary cause for the cancer in the family that [name] did not inherit and therefore was not identified in her testing.  Therefore, it is important to remain in touch with cancer genetics in the future so that we can continue to offer [name] the most up to date genetic testing.   ADDITIONAL GENETIC TESTING: We discussed with [name] that her genetic testing was fairly extensive.  If there are genes identified to increase cancer risk that can be analyzed in the future, we would be happy to discuss and coordinate this testing at that time.    CANCER SCREENING RECOMMENDATIONS: [name] test result is considered negative (normal).  This means that we have not identified a hereditary cause for her family history of cancer at this time. Most cancers happen by chance and this negative test suggests that her cancer may fall into this category.    Possible reasons for [name]'s negative genetic test include:  1. There may be a gene mutation in one of these genes that current testing methods cannot detect but that chance is small.  2. There could be another gene that has not yet been discovered, or that we have not yet tested, that is responsible for the cancer diagnoses in the family.  3.  There may be no  hereditary risk for cancer in the family. The cancers in [name] and/or her family may be sporadic/familial or due to other genetic and environmental factors. 4. It is also possible there is a hereditary cause for the cancer in the family that [name] did not inherit.  Therefore, it is recommended she continue to follow the cancer management and screening guidelines provided by her high risk and primary healthcare provider. An individual's cancer risk and medical management are not determined by genetic test results alone. Overall cancer risk assessment incorporates additional factors, including personal medical history, family history, and any available  genetic information that may result in a personalized plan for cancer prevention and surveillance  An individual's cancer risk and medical management are not determined by genetic test results alone. Overall cancer risk assessment incorporates additional factors, including personal medical history, family history, and any available genetic information that may result in a personalized plan for cancer prevention and surveillance.  [name] has been determined to be at high risk for breast cancer. her Tyrer-Cuzick risk score is 22.6%.  For women with a greater than 20% lifetime risk of breast cancer, the [employer] (NCCN) recommends the following:   1.      Clinical encounter every 6-12 months to begin when identified as being at increased risk, but not before age 70  2.      Annual mammograms. Tomosynthesis is recommended starting 10 years earlier than the youngest breast cancer diagnosis in the family or at age 5 (whichever comes first), but not before age 3    21.      Annual breast MRI starting 10 years earlier than the youngest breast cancer diagnosis in the family or at age 7 (whichever comes first), but not before age 58.   We, therefore, discussed that it is reasonable for [name] to be followed by a  high-risk breast cancer clinic; in addition to a yearly mammogram and physical exam by a healthcare provider, she should discuss the usefulness of an annual breast MRI with the high-risk clinic providers.    RECOMMENDATIONS FOR FAMILY MEMBERS:  Individuals in this family might be at some increased risk of developing cancer, over the general population risk, simply due to the family history of cancer.  We recommended women in this family have a yearly mammogram beginning at age 53, or 44 years younger than the earliest onset of cancer, an annual clinical breast exam, and perform monthly breast self-exams. Women in this family should also have a gynecological exam as recommended by their primary provider. All family members should be referred for colonoscopy starting at age 17.  FOLLOW-UP: Lastly, we discussed with [name] that cancer genetics is a rapidly advancing field and it is possible that new genetic tests will be appropriate for her and/or her family members in the future. We encouraged her to remain in contact with cancer genetics on an annual basis so we can update her personal and family histories and let her know of advances in cancer genetics that may benefit this family.   Our contact number was provided. [name] questions were answered to her satisfaction, and she knows she is welcome to call us at anytime with additional questions or concerns.   Maylon Cos, MS, [facility] Licensed, Certified Genetic Counselor [email or web address]

## 2022-08-22 NOTE — H&P (Signed)
PREOPERATIVE H&P  Chief Complaint: left knee ACL tear, synovitis  HPI: Kathy White is a 42 y.o. female who is scheduled for, Procedure(s): KNEE ARTHROSCOPY WITH ANTERIOR CRUCIATE LIGAMENT (ACL) RECONSTRUCTION WITH TIBIAL ANTERIOR ALLOGRAFT SYNOVECTOMY.   Patient has a past medical history significant for GERD, HTN,.   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  She had an ACL reconstruction with an unknown graft by another surgeon about four years ago.  She did okay afterwards.  She has had some instability and pain in her knee.  She finds it is difficult to get up from a low chair.  She feels the knee gives out on her on occasion.    Symptoms are rated as moderate to severe, and have been worsening.  This is significantly impairing activities of daily living.    Please see clinic note for further details on this patient's care.    She has elected for surgical management.   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   Hematuria 04/21/2013   History of abnormal cervical Pap smear    History of HPV infection    Hypertension    IBS (irritable bowel syndrome)    Left ACL tear    Migraine    OA (osteoarthritis) of knee    Obesity    Pain of joint of left ankle and foot 10/08/2019   PCO (polycystic ovaries)    Pelvic pain 09/16/2018   S/P ACL reconstruction 03/22/2015   Stiffness of left knee 09/25/2021   Vaginal Pap smear, abnormal    Vitamin D deficiency    Past Surgical History:  Procedure  Laterality Date   BREAST BIOPSY  08/07/2022   MM RT RADIOACTIVE SEED LOC MAMMO GUIDE 08/07/2022 [facility] MAMMOGRAP[facility]   BREAST LUMPECTOMY WITH RADIOACTIVE SEED LOCALIZATION Right 08/08/2022   Procedure: RIGHT BREAST LUMPECTOMY WITH RADIOACTIVE SEED LOCALIZATION;  Surgeon: Griselda Miner, MD;  Location: [facility];  Service: General;  Laterality: Right;   CARPAL TUNNEL RELEASE Right 04/28/2021   CERVICAL BIOPSY  W/ LOOP ELECTRODE EXCISION     CESAREAN SECTION  11/14/2004   EXAM UNDER ANESTHESIA WITH MANIPULATION OF KNEE Left 03/22/2015   Procedure: LEFT KNEE EXAM UNDER ANESTHESIA  ;  Surgeon: Eugenia Mcalpine, MD;  Location: [facility];  Service: Orthopedics;  Laterality: Left;   HAND WOUND EXPLORATION AND TENDON REPAIR'S Right 10/12/2014   right ring and index fingers   KNEE ARTHROSCOPY W/ PARTIAL MEDIAL MENISCECTOMY Left 09/23/2021   KNEE ARTHROSCOPY WITH ANTERIOR CRUCIATE LIGAMENT (ACL) REPAIR WITH HAMSTRING GRAFT Left 03/22/2015   Procedure: LEFT KNEE ARTHROSCOPY WITH ANTERIOR CRUCIATE LIGAMENT (ACL) AUTOGRAFT RECONSTRUCTION;  Surgeon: Eugenia Mcalpine, MD;  Location: [facility];  Service: Orthopedics;  Laterality: Left;  ANESTHESIA: GENERAL/ABDUCTOR CANAL BLOCK   KNEE SURGERY  09/2021   LAPAROSCOPIC CHOLECYSTECTOMY  12/20/2004   LAPAROSCOPIC GASTRIC BAND REMOVAL WITH LAPAROSCOPIC GASTRIC SLEEVE RESECTION  09/09/2017   MENISECTOMY Left  03/22/2015   Procedure: PARTIAL LEFT KNEE  LATERAL MENISECTOMY, CHONDROPLASTY;  Surgeon: Eugenia Mcalpine, MD;  Location: [facility];  Service: Orthopedics;  Laterality: Left;   NERVE, TENDON AND ARTERY REPAIR Right 10/12/2014   Procedure: RIGHT RING FINGER AND RIGHT SMALL FINGER WOUND EXPLORATION WITH TENDON REPAIRS;  Surgeon: Bradly Bienenstock, MD;  Location: [facility];  Service: Orthopedics;  Laterality: Right;   WISDOM TOOTH EXTRACTION     Social History   Socioeconomic History   Marital status: Single     Spouse name: Not on file   Number of children: 1   Years of education: Not on file   Highest education level: Bachelor's degree (e.g., BA, AB, BS)  Occupational History   Not on file  Tobacco Use   Smoking status: Never   Smokeless tobacco: Never  Vaping Use   Vaping Use: Never used  Substance and Sexual Activity   Alcohol use: No   Drug use: No   Sexual activity: Yes    Birth control/protection: I.U.D.  Other Topics Concern   Not on file  Social History Narrative   Not on file   Social Determinants of Health   Financial Resource Strain: Low Risk  (10/23/2021)   Overall Financial Resource Strain (CARDIA)    Difficulty of Paying Living Expenses: Not hard at all  Food Insecurity: Food Insecurity Present (10/23/2021)   Hunger Vital Sign    Worried About Running Out of Food in the Last Year: Sometimes true    Ran Out of Food in the Last Year: Sometimes true  Transportation Needs: No Transportation Needs (10/23/2021)   PRAPARE - Administrator, Civil Service (Medical): No    Lack of Transportation (Non-Medical): No  Physical Activity: Insufficiently Active (10/23/2021)   Exercise Vital Sign    Days of Exercise per Week: 1 day    Minutes of Exercise per Session: 30 min  Stress: Stress Concern Present (10/23/2021)   [employer] of Occupational Health - Occupational Stress Questionnaire    Feeling of Stress : Very much  Social Connections: Socially Isolated (10/23/2021)   Social Connection and Isolation Panel [NHANES]    Frequency of Communication with Friends and Family: More than three times a week    Frequency of Social Gatherings with Friends and Family: More than three times a week    Attends Religious Services: Never    Database administrator or Organizations: No    Attends Engineer, structural: Never    Marital Status: Never married   Family History  Problem Relation Age of Onset   Hypertension Mother    Breast cancer Mother 61   BRCA 1/2  Mother        BRCA1+   Diabetes Father    Hypertension Father    Other Father        open heart surgery   Anxiety disorder Father    Depression Father    Heart disease Father    Stroke Father    Hypertension Sister    Diabetes Sister    Breast cancer Sister 35       bilateral   Stroke Sister    Anxiety disorder Sister    Depression Sister    Heart disease Sister    BRCA 1/2 Sister        BRCA1   Diabetes Sister    Hypertension Sister    Neuropathy Sister    Obesity Sister    Anxiety disorder Sister  Depression Sister    Hypertension Sister    Thyroid disease Sister    Endometrial cancer Sister 40   Obesity Sister    Thyroid disease Brother    BRCA 1/2 Maternal Grandmother        BRCA1 pos   Breast cancer Maternal Grandmother        bilateral, d. 52   Bone cancer Maternal Grandfather    Diabetes Paternal Grandfather    Anxiety disorder Son    Depression Son    Obesity Son    Cancer Maternal Aunt    Breast cancer Maternal Aunt        bilateral; d. 20   BRCA 1/2 Cousin    Breast cancer Cousin 26       d. 29   Allergies  Allergen Reactions   Bee Venom Anaphylaxis and Hives   Hydrocodone Nausea Only and Rash    Other reaction(s): Unknown   Oxycodone-Acetaminophen Nausea And Vomiting    Any of this type of pain medication   Amlodipine Swelling    Swelling in feet, ankles and legs   Attends Briefs Small Itching   Percocet [Oxycodone-Acetaminophen] Nausea And Vomiting   Tape Rash   Prior to Admission medications   Medication Sig Start Date End Date Taking? Authorizing Provider  budesonide-formoterol (SYMBICORT) 160-4.5 MCG/ACT inhaler As needed.    [provider]  buPROPion (WELLBUTRIN XL) 150 MG 24 hr tablet Take 150 mg by mouth daily.    [provider]  Calcium Carb-Cholecalciferol (CALTRATE 600+D3 PO) Take 1 tablet by mouth daily.    [provider]  carvedilol (COREG) 12.5 MG tablet Take 1 tablet (12.5 mg total) by mouth 2  (two) times daily with a meal. 03/08/22   Lula Olszewski, MD  cetirizine (ZYRTEC) 10 MG tablet Take by mouth.    [provider]  cholecalciferol (VITAMIN D) 1000 units tablet Take 4,000 Units by mouth daily.    [provider]  Cranberry-Vitamin C-Probiotic (AZO CRANBERRY PO) Take 2 capsules by mouth daily in the afternoon.    [provider]  cyclobenzaprine (FLEXERIL) 10 MG tablet As needed.    [provider]  EPINEPHrine 0.3 mg/0.3 mL IJ SOAJ injection epinephrine 0.3 mg/0.3 mL injection, auto-injector    [provider]  famotidine (PEPCID) 40 MG tablet Take 40 mg by mouth daily.    [provider]  fluticasone (FLONASE) 50 MCG/ACT nasal spray Place 2 sprays into the nose as needed for allergies.  07/20/13   [provider]  gabapentin (NEURONTIN) 400 MG capsule Take 400 mg by mouth at bedtime.    [provider]  hydrochlorothiazide (HYDRODIURIL) 25 MG tablet Take 1 tablet by mouth daily. 06/14/20   [provider]  hydrOXYzine (ATARAX/VISTARIL) 10 MG tablet hydroxyzine HCl 10 mg tablet    [provider]  ipratropium (ATROVENT) 0.06 % nasal spray 2 sprays in each nostril    [provider]  JANUVIA 50 MG tablet Take 50 mg by mouth daily.    [provider]  levonorgestrel (MIRENA) 20 MCG/24HR IUD 1 each by Intrauterine route once.    [provider]  linaclotide (LINZESS) 145 MCG CAPS capsule Take 145 mcg by mouth. As needed.    [provider]  losartan (COZAAR) 100 MG tablet Take 1 tablet by mouth daily. 07/05/20   [provider]  meloxicam (MOBIC) 7.5 MG tablet As needed.    [provider]  montelukast (SINGULAIR) 10 MG tablet  Take 1 tablet by mouth daily. 06/13/20   [provider]  Multiple Vitamins-Calcium (ONE-A-DAY WOMENS PO) Take by mouth daily.    [provider]  naltrexone (DEPADE) 50 MG tablet Take 25 mg by mouth daily.     [provider]  tamoxifen (NOLVADEX) 10 MG tablet Take 0.5 tablets (5 mg total) by mouth daily. 08/02/22   Rachel Moulds, MD  traMADol (ULTRAM) 50 MG tablet Take 1 tablet (50 mg total) by mouth every 6 (six) hours as needed. 08/08/22 08/08/23  Chevis Pretty III, MD  traZODone (DESYREL) 50 MG tablet Take 50 mg by mouth at bedtime.    [provider]  triamcinolone lotion (KENALOG) 0.1 %     [provider]    ROS: All other systems have been reviewed and were otherwise negative with the exception of those mentioned in the HPI and as above.  Physical Exam: General: Alert, no acute distress Cardiovascular: No pedal edema Respiratory: No cyanosis, no use of accessory musculature GI: No organomegaly, abdomen is soft and non-tender Skin: No lesions in the area of chief complaint Neurologic: Sensation intact distally Psychiatric: Patient is competent for consent with normal mood and affect Lymphatic: No axillary or cervical lymphadenopathy  MUSCULOSKELETAL:  Range of motion of the knee is 0-120 degrees, limited by body habitus.  Incision is benign.  Distal motor and sensory function are intact.   Imaging: X-rays and MRI reviewed and demonstrate some mild arthritis in primarily the lateral compartment.  Signs of a lateral partial meniscectomy.  An all inside ACL reconstruction was done with a relatively small appearing graft.  Tunnel appears to be a few millimeters anterior, but it is a reasonable position from a verticality standpoint.  There is complete rupture of the graft.    Assessment: left knee ACL tear, synovitis  Plan: Plan for Procedure(s): KNEE ARTHROSCOPY WITH ANTERIOR CRUCIATE LIGAMENT (ACL) RECONSTRUCTION WITH TIBIAL ANTERIOR ALLOGRAFT SYNOVECTOMY  Patient does have some arthritis type symptoms; however, the knee is unstable.  She finds that she has continued instability.  We talked to her at length about the fact that she would not get relief of pain  that might be coming from arthritis from an ACL reconstruction; however, we could provide her a more stable knee.  She will likely need an arthroplasty at some point in the future based on her overall appearance.  The risks, benefits and alternatives of revision ACL reconstruction with allograft were discussed.    The risks benefits and alternatives were discussed with the patient including but not limited to the risks of nonoperative treatment, versus surgical intervention including infection, bleeding, nerve injury,  blood clots, cardiopulmonary complications, morbidity, mortality, among others, and they were willing to proceed.   The patient acknowledged the explanation, agreed to proceed with the plan and consent was signed.   Operative Plan: Left knee scope with ACL reconstruction with allograft Discharge Medications: standard DVT Prophylaxis: aspirin Physical Therapy: outpatient PT Special Discharge needs: Knee immobilizer. IceMan   Vernetta Honey, PA-C  08/22/2022 3:35 PM

## 2022-08-23 ENCOUNTER — Ambulatory Visit (HOSPITAL_BASED_OUTPATIENT_CLINIC_OR_DEPARTMENT_OTHER): Payer: Medicaid Other | Admitting: Certified Registered"

## 2022-08-23 ENCOUNTER — Encounter (HOSPITAL_BASED_OUTPATIENT_CLINIC_OR_DEPARTMENT_OTHER)
Admission: RE | Disposition: A | Discharge status: Home / Self Care | Payer: Self-pay | Source: Home / Self Care | Attending: Orthopaedic Surgery

## 2022-08-23 ENCOUNTER — Ambulatory Visit (HOSPITAL_BASED_OUTPATIENT_CLINIC_OR_DEPARTMENT_OTHER): Payer: Medicaid Other

## 2022-08-23 ENCOUNTER — Ambulatory Visit (HOSPITAL_BASED_OUTPATIENT_CLINIC_OR_DEPARTMENT_OTHER)
Admission: RE | Admit: 2022-08-23 | Discharge: 2022-08-23 | Disposition: A | Discharge status: Home / Self Care | Payer: Medicaid Other | Attending: Orthopaedic Surgery | Admitting: Orthopaedic Surgery

## 2022-08-23 ENCOUNTER — Other Ambulatory Visit: Payer: Self-pay

## 2022-08-23 ENCOUNTER — Encounter (HOSPITAL_BASED_OUTPATIENT_CLINIC_OR_DEPARTMENT_OTHER): Payer: Self-pay | Admitting: Orthopaedic Surgery

## 2022-08-23 DIAGNOSIS — I10 Essential (primary) hypertension: Secondary | ICD-10-CM

## 2022-08-23 DIAGNOSIS — S83512A Sprain of anterior cruciate ligament of left knee, initial encounter: Secondary | ICD-10-CM | POA: Diagnosis present

## 2022-08-23 DIAGNOSIS — M659 Synovitis and tenosynovitis, unspecified: Secondary | ICD-10-CM | POA: Diagnosis not present

## 2022-08-23 DIAGNOSIS — X58XXXA Exposure to other specified factors, initial encounter: Secondary | ICD-10-CM | POA: Diagnosis not present

## 2022-08-23 DIAGNOSIS — Z01818 Encounter for other preprocedural examination: Secondary | ICD-10-CM

## 2022-08-23 HISTORY — PX: SYNOVECTOMY: SHX5180

## 2022-08-23 LAB — POCT PREGNANCY, URINE: Preg Test, Ur: NEGATIVE

## 2022-08-23 SURGERY — KNEE ARTHROSCOPY WITH ANTERIOR CRUCIATE LIGAMENT (ACL) RECONSTRUCTION WITH HAMSTRING GRAFT
Anesthesia: General | Site: Knee | Laterality: Left

## 2022-08-23 MED ORDER — ONDANSETRON HCL 4 MG/2ML IJ SOLN
INTRAMUSCULAR | Status: AC
  Filled 2022-08-23: qty 2

## 2022-08-23 MED ORDER — PROPOFOL 500 MG/50ML IV EMUL
INTRAVENOUS | Status: AC
  Filled 2022-08-23: qty 50

## 2022-08-23 MED ORDER — FENTANYL CITRATE (PF) 100 MCG/2ML IJ SOLN
INTRAMUSCULAR | Status: AC
  Filled 2022-08-23: qty 2

## 2022-08-23 MED ORDER — FENTANYL CITRATE (PF) 100 MCG/2ML IJ SOLN
25.0000 ug | INTRAMUSCULAR | Status: DC | PRN
  Administered 2022-08-23 (×2): 50 ug via INTRAVENOUS

## 2022-08-23 MED ORDER — CEFAZOLIN SODIUM-DEXTROSE 2-4 GM/100ML-% IV SOLN
INTRAVENOUS | Status: AC
  Filled 2022-08-23: qty 100

## 2022-08-23 MED ORDER — DEXAMETHASONE SODIUM PHOSPHATE 10 MG/ML IJ SOLN
INTRAMUSCULAR | Status: DC | PRN
  Administered 2022-08-23: 5 mg via INTRAVENOUS

## 2022-08-23 MED ORDER — CELECOXIB 100 MG PO CAPS: 100.0000 mg | ORAL_CAPSULE | Freq: Two times a day (BID) | ORAL | 0 refills | Status: AC

## 2022-08-23 MED ORDER — EPHEDRINE SULFATE (PRESSORS) 50 MG/ML IJ SOLN
INTRAMUSCULAR | Status: DC | PRN
  Administered 2022-08-23: 10 mg via INTRAVENOUS

## 2022-08-23 MED ORDER — ONDANSETRON HCL 4 MG/2ML IJ SOLN: 4.0000 mg | Freq: Four times a day (QID) | INTRAMUSCULAR | Status: DC | PRN

## 2022-08-23 MED ORDER — PROPOFOL 10 MG/ML IV BOLUS
INTRAVENOUS | Status: DC | PRN
  Administered 2022-08-23: 200 mg via INTRAVENOUS

## 2022-08-23 MED ORDER — SODIUM CHLORIDE 0.9 % IR SOLN
Status: DC | PRN
  Administered 2022-08-23: 6000 mL

## 2022-08-23 MED ORDER — VANCOMYCIN HCL 1000 MG IV SOLR
INTRAVENOUS | Status: AC
  Filled 2022-08-23: qty 20

## 2022-08-23 MED ORDER — ACETAMINOPHEN 500 MG PO TABS
ORAL_TABLET | ORAL | Status: AC
  Filled 2022-08-23: qty 2

## 2022-08-23 MED ORDER — VANCOMYCIN HCL 1000 MG IV SOLR
INTRAVENOUS | Status: DC | PRN
  Administered 2022-08-23: 1000 mg via TOPICAL

## 2022-08-23 MED ORDER — GABAPENTIN 300 MG PO CAPS
ORAL_CAPSULE | ORAL | Status: AC
  Filled 2022-08-23: qty 1

## 2022-08-23 MED ORDER — ONDANSETRON HCL 4 MG PO TABS: 4.0000 mg | ORAL_TABLET | Freq: Three times a day (TID) | ORAL | 0 refills | Status: AC | PRN

## 2022-08-23 MED ORDER — HYDROMORPHONE HCL 1 MG/ML IJ SOLN
INTRAMUSCULAR | Status: AC
  Filled 2022-08-23: qty 0.5

## 2022-08-23 MED ORDER — MIDAZOLAM HCL 2 MG/2ML IJ SOLN
1.0000 mg | Freq: Once | INTRAMUSCULAR | Status: AC
  Administered 2022-08-23: 1 mg via INTRAVENOUS

## 2022-08-23 MED ORDER — FENTANYL CITRATE (PF) 100 MCG/2ML IJ SOLN
50.0000 ug | Freq: Once | INTRAMUSCULAR | Status: AC
  Administered 2022-08-23: 50 ug via INTRAVENOUS

## 2022-08-23 MED ORDER — DEXAMETHASONE SODIUM PHOSPHATE 10 MG/ML IJ SOLN
INTRAMUSCULAR | Status: AC
  Filled 2022-08-23: qty 1

## 2022-08-23 MED ORDER — HYDROMORPHONE HCL 1 MG/ML IJ SOLN
0.5000 mg | INTRAMUSCULAR | Status: DC | PRN
  Administered 2022-08-23: 0.5 mg via INTRAVENOUS

## 2022-08-23 MED ORDER — MIDAZOLAM HCL 2 MG/2ML IJ SOLN
INTRAMUSCULAR | Status: AC
  Filled 2022-08-23: qty 2

## 2022-08-23 MED ORDER — ACETAMINOPHEN 500 MG PO TABS
1000.0000 mg | ORAL_TABLET | Freq: Once | ORAL | Status: AC
  Administered 2022-08-23: 1000 mg via ORAL

## 2022-08-23 MED ORDER — ACETAMINOPHEN 500 MG PO TABS: 1000.0000 mg | ORAL_TABLET | Freq: Three times a day (TID) | ORAL | 0 refills | Status: AC

## 2022-08-23 MED ORDER — CEFAZOLIN SODIUM-DEXTROSE 2-4 GM/100ML-% IV SOLN
2.0000 g | INTRAVENOUS | Status: AC
  Administered 2022-08-23: 2 g via INTRAVENOUS

## 2022-08-23 MED ORDER — OXYCODONE HCL 5 MG PO TABS
ORAL_TABLET | ORAL | Status: AC
  Filled 2022-08-23: qty 1

## 2022-08-23 MED ORDER — ONDANSETRON HCL 4 MG/2ML IJ SOLN
INTRAMUSCULAR | Status: DC | PRN
  Administered 2022-08-23: 4 mg via INTRAVENOUS

## 2022-08-23 MED ORDER — OXYCODONE HCL 5 MG PO TABS
5.0000 mg | ORAL_TABLET | Freq: Once | ORAL | Status: AC
  Administered 2022-08-23: 5 mg via ORAL

## 2022-08-23 MED ORDER — LACTATED RINGERS IV SOLN: INTRAVENOUS | Status: DC

## 2022-08-23 MED ORDER — GABAPENTIN 300 MG PO CAPS
300.0000 mg | ORAL_CAPSULE | Freq: Once | ORAL | Status: AC
  Administered 2022-08-23: 300 mg via ORAL

## 2022-08-23 MED ORDER — FENTANYL CITRATE (PF) 100 MCG/2ML IJ SOLN
INTRAMUSCULAR | Status: DC | PRN
  Administered 2022-08-23 (×2): 25 ug via INTRAVENOUS
  Administered 2022-08-23: 50 ug via INTRAVENOUS

## 2022-08-23 MED ORDER — OXYCODONE HCL 5 MG PO TABS: ORAL_TABLET | ORAL | 0 refills | Status: AC

## 2022-08-23 MED ORDER — KETOROLAC TROMETHAMINE 30 MG/ML IJ SOLN
INTRAMUSCULAR | Status: AC
  Filled 2022-08-23: qty 1

## 2022-08-23 MED ORDER — ROPIVACAINE HCL 5 MG/ML IJ SOLN
INTRAMUSCULAR | Status: DC | PRN
  Administered 2022-08-23: 25 mL via PERINEURAL

## 2022-08-23 MED ORDER — LIDOCAINE 2% (20 MG/ML) 5 ML SYRINGE
INTRAMUSCULAR | Status: AC
  Filled 2022-08-23: qty 5

## 2022-08-23 MED ORDER — KETOROLAC TROMETHAMINE 30 MG/ML IJ SOLN
30.0000 mg | Freq: Once | INTRAMUSCULAR | Status: AC
  Administered 2022-08-23: 30 mg via INTRAVENOUS

## 2022-08-23 MED ORDER — ASPIRIN 81 MG PO CHEW: 81.0000 mg | CHEWABLE_TABLET | Freq: Two times a day (BID) | ORAL | 0 refills | Status: AC

## 2022-08-23 MED ORDER — LIDOCAINE 2% (20 MG/ML) 5 ML SYRINGE
INTRAMUSCULAR | Status: DC | PRN
  Administered 2022-08-23: 50 mg via INTRAVENOUS

## 2022-08-23 SURGICAL SUPPLY — 84 items
ANCHOR TIGHTROPE II 20 W/IB (Anchor) IMPLANT
APL PRP STRL LF DISP 70% ISPRP (MISCELLANEOUS) ×1
BLADE SHAVER BONE 5.0X13 (MISCELLANEOUS) ×1 IMPLANT
BLADE SURG 10 STRL SS (BLADE) ×1 IMPLANT
BLADE SURG 15 STRL LF DISP TIS (BLADE) ×1 IMPLANT
BLADE SURG 15 STRL SS (BLADE) ×1
BNDG CMPR 5X4 CHSV STRCH STRL (GAUZE/BANDAGES/DRESSINGS)
BNDG CMPR 5X62 HK CLSR LF (GAUZE/BANDAGES/DRESSINGS) ×1
BNDG CMPR 6"X 5 YARDS HK CLSR (GAUZE/BANDAGES/DRESSINGS) ×1
BNDG COHESIVE 4X5 TAN STRL LF (GAUZE/BANDAGES/DRESSINGS) IMPLANT
BNDG ELASTIC 6INX 5YD STR LF (GAUZE/BANDAGES/DRESSINGS) ×1 IMPLANT
BONE TUNNEL PLUG CANNULATED (MISCELLANEOUS) IMPLANT
BURR OVAL 8 FLU 4.0X13 (MISCELLANEOUS) IMPLANT
CHLORAPREP W/TINT 26 (MISCELLANEOUS) ×1 IMPLANT
CLSR STERI-STRIP ANTIMIC 1/2X4 (GAUZE/BANDAGES/DRESSINGS) IMPLANT
COLLECTOR GRAFT TISSUE (SYSTAGENIX WOUND MANAGEMENT)
COOLER ICEMAN CLASSIC (MISCELLANEOUS) ×1 IMPLANT
COVER BACK TABLE 60X90IN (DRAPES) ×1 IMPLANT
CUFF TOURN SGL QUICK 34 (TOURNIQUET CUFF)
CUFF TRNQT CYL 34X4.125X (TOURNIQUET CUFF) IMPLANT
CUTTER TENSIONER SUT 2-0 0 FBW (INSTRUMENTS) IMPLANT
DISSECTOR 3.5MM X 13CM CVD (MISCELLANEOUS) IMPLANT
DISSECTOR 4.0MMX13CM CVD (MISCELLANEOUS) ×1 IMPLANT
DRAPE IMP U-DRAPE 54X76 (DRAPES) IMPLANT
DRAPE OEC MINIVIEW 54X84 (DRAPES) ×1 IMPLANT
DRAPE U-SHAPE 47X51 STRL (DRAPES) ×1 IMPLANT
DRAPE-T ARTHROSCOPY W/POUCH (DRAPES) ×1 IMPLANT
ELECT REM PT RETURN 9FT ADLT (ELECTROSURGICAL) ×1
ELECTRODE REM PT RTRN 9FT ADLT (ELECTROSURGICAL) ×1 IMPLANT
FIBERSTICK 2 (SUTURE) IMPLANT
GAUZE SPONGE 4X4 12PLY STRL (GAUZE/BANDAGES/DRESSINGS) ×2 IMPLANT
GLOVE BIO SURGEON STRL SZ 6.5 (GLOVE) ×1 IMPLANT
GLOVE BIOGEL PI IND STRL 6.5 (GLOVE) ×1 IMPLANT
GLOVE BIOGEL PI IND STRL 8 (GLOVE) ×1 IMPLANT
GLOVE ECLIPSE 8.0 STRL XLNG CF (GLOVE) ×1 IMPLANT
GOWN STRL REUS W/ TWL LRG LVL3 (GOWN DISPOSABLE) ×2 IMPLANT
GOWN STRL REUS W/TWL LRG LVL3 (GOWN DISPOSABLE) ×2
GOWN STRL REUS W/TWL XL LVL3 (GOWN DISPOSABLE) ×1 IMPLANT
GRAFT TISS ANT TIB TNDN (Tissue) IMPLANT
IMMOBILIZER KNEE 20 (SOFTGOODS)
IMMOBILIZER KNEE 20 THIGH 36 (SOFTGOODS) IMPLANT
IMMOBILIZER KNEE 22 UNIV (SOFTGOODS) IMPLANT
IMP SYS 2ND FIX PEEK 4.75X19.1 (Miscellaneous) ×1 IMPLANT
IMPL SYS 2ND FX PEEK 4.75X19.1 (Miscellaneous) IMPLANT
IV NS IRRIG 3000ML ARTHROMATIC (IV SOLUTION) ×4 IMPLANT
KIT TRANSTIBIAL (DISPOSABLE) ×1 IMPLANT
NDL SAFETY ECLIP 18X1.5 (MISCELLANEOUS) ×1 IMPLANT
NDL SUT 2-0 SCORPION KNEE (NEEDLE) IMPLANT
NEEDLE SUT 2-0 SCORPION KNEE (NEEDLE) IMPLANT
NS IRRIG 1000ML POUR BTL (IV SOLUTION) ×1 IMPLANT
PACK ARTHROSCOPY DSU (CUSTOM PROCEDURE TRAY) ×1 IMPLANT
PACK BASIN DAY SURGERY FS (CUSTOM PROCEDURE TRAY) IMPLANT
PAD CAST 4YDX4 CTTN HI CHSV (CAST SUPPLIES) ×1 IMPLANT
PAD COLD SHLDR WRAP-ON (PAD) ×1 IMPLANT
PADDING CAST COTTON 4X4 STRL (CAST SUPPLIES) ×1
PENCIL SMOKE EVACUATOR (MISCELLANEOUS) ×1 IMPLANT
PORT APPOLLO RF 90DEGREE MULTI (SURGICAL WAND) ×1 IMPLANT
PORTAL SKID DEVICE (INSTRUMENTS) IMPLANT
SCREW FT BIOCOMP 10X30 (Screw) IMPLANT
SHEET MEDIUM DRAPE 40X70 STRL (DRAPES) ×1 IMPLANT
SLEEVE SCD COMPRESS KNEE MED (STOCKING) ×1 IMPLANT
SPIKE FLUID TRANSFER (MISCELLANEOUS) IMPLANT
SPONGE T-LAP 18X18 ~~LOC~~+RFID (SPONGE) IMPLANT
SPONGE T-LAP 4X18 ~~LOC~~+RFID (SPONGE) ×1 IMPLANT
SUT 0 FIBERLOOP 38 BLUE TPR ND (SUTURE)
SUT 2 FIBERLOOP 20 STRT BLUE (SUTURE)
SUT FIBERWIRE #2 38 T-5 BLUE (SUTURE)
SUT MNCRL AB 4-0 PS2 18 (SUTURE) ×1 IMPLANT
SUT PDS AB 0 CT 36 (SUTURE) IMPLANT
SUT VIC AB 0 CT1 27 (SUTURE) ×2
SUT VIC AB 0 CT1 27XBRD ANBCTR (SUTURE) ×2 IMPLANT
SUT VIC AB 1 CT1 27 (SUTURE)
SUT VIC AB 1 CT1 27XBRD ANBCTR (SUTURE) IMPLANT
SUT VIC AB 3-0 SH 27 (SUTURE) ×1
SUT VIC AB 3-0 SH 27X BRD (SUTURE) ×1 IMPLANT
SUTURE 0 FIBERLP 38 BLU TPR ND (SUTURE) IMPLANT
SUTURE 2 FIBERLOOP 20 STRT BLU (SUTURE) IMPLANT
SUTURE FIBERWR #2 38 T-5 BLUE (SUTURE) IMPLANT
TENDON ANTERIOR TIBIALIS (Tissue) ×1 IMPLANT
TISSUE GRAFT COLLECTOR (SYSTAGENIX WOUND MANAGEMENT) IMPLANT
TOWEL GREEN STERILE FF (TOWEL DISPOSABLE) ×1 IMPLANT
TUBE CONNECTING 20X1/4 (TUBING) ×1 IMPLANT
TUBE SUCTION HIGH CAP CLEAR NV (SUCTIONS) ×1 IMPLANT
TUBING ARTHROSCOPY IRRIG 16FT (MISCELLANEOUS) ×1 IMPLANT

## 2022-08-23 NOTE — Discharge Instructions (Addendum)
Post Anesthesia Home Care Instructions  Activity: Get plenty of rest for the remainder of the day. A responsible individual must stay with you for 24 hours following the procedure.  For the next 24 hours, DO NOT: -Drive a car -Advertising copywriter -Drink alcoholic beverages -Take any medication unless instructed by your physician -Make any legal decisions or sign important papers.  Meals: Start with liquid foods such as gelatin or soup. Progress to regular foods as tolerated. Avoid greasy, spicy, heavy foods. If nausea and/or vomiting occur, drink only clear liquids until the nausea and/or vomiting subsides. Call your physician if vomiting continues.  Special Instructions/Symptoms: Your throat may feel dry or sore from the anesthesia or the breathing tube placed in your throat during surgery. If this causes discomfort, gargle with warm salt water. The discomfort should disappear within 24 hours.  If you had a scopolamine patch placed behind your ear for the management of post- operative nausea and/or vomiting:  1. The medication in the patch is effective for 72 hours, after which it should be removed.  Wrap patch in a tissue and discard in the trash. Wash hands thoroughly with soap and water. 2. You may remove the patch earlier than 72 hours if you experience unpleasant side effects which may include dry mouth, dizziness or visual disturbances. 3. Avoid touching the patch. Wash your hands with soap and water after contact with the patch.      Regional Anesthesia Blocks  1. Numbness or the inability to move the "blocked" extremity may last from 3-48 hours after placement. The length of time depends on the medication injected and your individual response to the medication. If the numbness is not going away after 48 hours, call your surgeon.  2. The extremity that is blocked will need to be protected until the numbness is gone and the  Strength has returned. Because you cannot feel it, you  will need to take extra care to avoid injury. Because it may be weak, you may have difficulty moving it or using it. You may not know what position it is in without looking at it while the block is in effect.  3. For blocks in the legs and feet, returning to weight bearing and walking needs to be done carefully. You will need to wait until the numbness is entirely gone and the strength has returned. You should be able to move your leg and foot normally before you try and bear weight or walk. You will need someone to be with you when you first try to ensure you do not fall and possibly risk injury.  4. Bruising and tenderness at the needle site are common side effects and will resolve in a few days.  5. Persistent numbness or new problems with movement should be communicated to the surgeon or the [facility] [phone] [facility] [phone]).  Next dose of tylenol may be taken at 1p  Next dose of ibuprofen may be taken at 1p  Ramond Marrow MD, MPH Alfonse Alpers, PA-C [facility] [address], [address] [phone] (tel)   [phone] (fax)   POST-OPERATIVE INSTRUCTIONS - ACL RECONSTRUCTION  WOUND CARE You may remove the Operative Dressing on Post-Op Day #3 (72hrs after surgery).   Leave steri strips in place.   If you feel more comfortable with it you can leave all dressings in place till your 1 week follow-up appointment.   KEEP THE INCISIONS CLEAN AND DRY. An ACE wrap may  be used to control swelling, do not wrap this too tight.  If the initial ACE wrap feels too tight or constricting you may loosen it. There may be a small amount of fluid/bleeding leaking at the surgical site.  This is normal; the knee is filled with fluid during the procedure and can leak for 24-48hrs after surgery.  You may change/reinforce the bandage as needed.  Use the Cryocuff, GameReady or Ice as often as possible for the first 3-4 days, then as needed  for pain relief. Always keep a towel, ACE wrap or other barrier between the cooling unit and your skin.  You may shower on Post-Op Day #3. Gently pat the area dry.  Do not soak the knee in water.  Do not go swimming in the pool or ocean until 4 weeks after surgery or when otherwise instructed.  BRACE/AMBULATION Your leg will be placed in a brace post-operatively.  You may remove for hygiene only! You will need to wear your brace at all times until we discuss it further.  It should be locked in full extension (0 degrees) if adjustable.   You will be instructed on further bracing after your first visit. Use crutches for comfort but you can put your full weight on the leg as tolerated.  PHYSICAL THERAPY - You will begin physical therapy soon after surgery (unless otherwise specified)  - A PT referral was sent to [facility] outpatient PT on [address] - Please call to set up an appointment, if you do not already have one    REGIONAL ANESTHESIA (NERVE BLOCKS) The anesthesia team may have performed a nerve block for you this is a great tool used to minimize pain.   The block may start wearing off overnight (between 8-24 hours postop) When the block wears off, your pain may go from nearly zero to the pain you would have had postop without the block. This is an abrupt transition but nothing dangerous is happening.   This can be a challenging period but utilize your as needed pain medications to try and manage this period. We suggest you use the pain medication the first night prior to going to bed, to ease this transition.  You may take an extra dose of narcotic when this happens if needed  POST-OP MEDICATIONS- Multimodal approach to pain control In general your pain will be controlled with a combination of substances.  Prescriptions unless otherwise discussed are electronically sent to your pharmacy.  This is a carefully made plan we use to minimize narcotic use.     Celebrex - Anti-inflammatory  medication taken on a scheduled basis Acetaminophen - Non-narcotic pain medicine taken on a scheduled basis  Oxycodone - This is a strong narcotic, to be used only on an "as needed" basis for SEVERE pain. Aspirin 81mg  - This medicine is used to minimize the risk of blood clots after surgery. Zofran - take as needed for nausea   FOLLOW-UP Please call the office to schedule a follow-up appointment for your incision check if you do not already have one, 7-10 days post-operatively. IF YOU HAVE ANY QUESTIONS, PLEASE FEEL FREE TO CALL OUR OFFICE.  HELPFUL INFORMATION   Keep your leg elevated to decrease swelling, which will then in turn decrease your pain. I would elevate the foot of your bed by putting a couple of couch pillows between your mattress and box spring. I would not keep pillow directly under your ankle.  You must wear the brace locked while sleeping  and ambulating until follow-up.   There will be MORE swelling on days 1-3 than there is on the day of surgery.  This also is normal. The swelling will decrease with the anti-inflammatory medication, ice and keeping it elevated. The swelling will make it more difficult to bend your knee. As the swelling goes down your motion will become easier  You may develop swelling and bruising that extends from your knee down to your calf and perhaps even to your foot over the next week. Do not be alarmed. This too is normal, and it is due to gravity  There may be some numbness adjacent to the incision site. This may last for 6-12 months or longer in some patients and is expected.  You may return to sedentary work/school in the next couple of days when you feel up to it. You will need to keep your leg elevated as much as possible   You should wean off your narcotic medicines as soon as you are able.  Most patients will be off narcotics before their first postop appointment.   We suggest you use the pain medication the first night prior to going to  bed, in order to ease any pain when the anesthesia wears off. You should avoid taking pain medications on an empty stomach as it will make you nauseous.  Do not drink alcoholic beverages or take illicit drugs when taking pain medications.  It is against the law to drive while taking narcotics. You cannot drive if your Right leg is in brace locked in extension.  Pain medication may make you constipated.  Below are a few solutions to try in this order: Decrease the amount of pain medication if you aren't having pain. Drink lots of decaffeinated fluids. Drink prune juice and/or each dried prunes  If the first 3 don't work start with additional solutions Take Colace - an over-the-counter stool softener Take Senokot - an over-the-counter laxative Take Miralax - a stronger over-the-counter laxative  For more information including helpful videos and documents visit our website:   [email or web address]

## 2022-08-23 NOTE — Op Note (Addendum)
Orthopaedic Surgery Operative Note (CSN: [id number])  Kathy White  07/22/1980 Date of Surgery: 08/23/2022   Diagnoses:  left knee ACL tear, synovitis  Procedure: Left revision allograft ACL reconstruction Left 2 compartment synovectomy Hardware removal tibia   Operative Finding Successful completion of the planned procedure.  Patient had a complete rupture of her graft.  Upon examination her graft was quite small.  Her tibial tunnel was in appropriate position the aperture was in the anatomic position.  The ACL tunnel for the femur was quite anterior though its position on the clock face was reasonable at about 2-2 30.  We are able to ream a completely noon tunnel with a 1 mm posterior wall.  This allowed for a 10 mm graft to be shuttled.  Formed anterior synovectomy the medial and lateral compartments.  There was some fraying of the lateral meniscus that was trimmed back however there was full-thickness cartilage loss in the posterior lateral aspect of the joint on the tibia as well as the femur consistent with her previous complete meniscal transection the meniscus to the popliteal hiatus.  There was grade 2 and 3 changes over the medial femoral condyle.  Patellofemoral compartment also grade 2 and 3 changes.  There was grade 4 changes with posterior lateral aspect of the tibial platea and lateral femoral condyle.  Patient is at high risk of stiffness and continued arthrosis secondary to her surgery however ACL reconstruction is quite routine for a revision case.  Post-operative plan: The patient will be weightbearing to tolerance in the knee immobilizer.  The patient will be discharged home.  DVT prophylaxis Aspirin 81 mg twice daily for 6 weeks.   Pain control with PRN pain medication preferring oral medicines.  Follow up plan will be scheduled in approximately 7 days for incision check and XR.  Post-Op Diagnosis: Same Surgeons:Primary: Bjorn Pippin, MD Assistants:Caroline McBane  PA-C Location: [facility] OR ROOM 6 Anesthesia: General with regional anesthesia Antibiotics: Ancef 2 g with local vancomycin powder 1 g at the surgical site Tourniquet time:  Total Tourniquet Time Documented: Thigh (Left) - 53 minutes Total: Thigh (Left) - 53 minutes  Estimated Blood Loss: Minimal Complications: None Specimens: None Implants: Implant Name Type Inv. Item Serial No. Manufacturer Lot No. LRB No. Used Action  TENDON ANTERIOR TIBIALIS - [id number] Tissue TENDON ANTERIOR TIBIALIS [id number] [employer] [id number] Left 1 Implanted  ANCHOR TIGHTROPE II 20 W/IB - [id number] Anchor ANCHOR TIGHTROPE II 20 W/IB  [employer] [id number] Left 1 Implanted  SCREW FT BIOCOMP 10X30 - [id number] Screw SCREW FT BIOCOMP 10X30  [employer] [id number] Left 1 Implanted  IMP SYS 2ND FIX PEEK 4.75X19.1 - [id number] Miscellaneous IMP SYS 2ND FIX PEEK 4.75X19.1  [employer] [id number] Left 1 Implanted    Indications for Surgery:   Kathy White is a 42 y.o. female with previous ACL reconstruction and lateral partial meniscectomy.  Benefits and risks of operative and nonoperative management were discussed prior to surgery with patient/guardian(s) and informed consent form was completed.  Specific risks including infection, need for additional surgery,    Procedure:   The patient was identified properly. Informed consent was obtained and the surgical site was marked. The patient was taken up to suite where general anesthesia was induced.  The patient was positioned supine on a regular bed.  The left knee was prepped and draped in the usual sterile fashion.  Timeout was performed before the beginning of the case.  Tourniquet was used for the above duration.  Began with diagnostic arthroscopy.  We made portals in the typical fashion.  We used the previous portals as a general guideline.  We cleared the anterior portion of the knee performed a synovectomy of synovial tissue in the medial lateral  compartment as well as the patellofemoral compartment.  Once we cleared the joint with the above-noted findings we identified the torn ACL.  The stump was cleared.  We identified that the tibial aperture was appropriate.  The femoral aperture was quite anterior.  We performed a revision notchplasty.  On the back table a tibialis anterior graft was prepped by Alfonse Alpers PA-C 10 mm in size  doubled over a tight rope.  We kept the sutures for the internal brace in place.  Once our graft was sized we carefully assessed the femoral tunnel.  It was appropriate for drilling posterior to this is the tunnel aperture of the previous tunnel was had a relatively appropriate position on the clock face however it was far anterior.  We had room posterior to this tunnel to Protilase a completely new tunnel.  We hyperflexed the knee and used a 7 mm offset guide as well as a guidepin to drill a 10 mm tunnel using a acorn reamer in the appropriate position at about the 2:30 position on the clock face.  We are able to ream protecting the back wall.  30 mm tunnel was reamed.  We removed all bony debris and passed a passing suture.  We cleared the tunnel and visualized using the scope that the tunnel was in the anatomic position.  Once this was complete we turned attention to tibial tunnel.  We made a incision on the anterior tibia and the previously placed incision scar.  We went down through skin sharply achieving hemostasis progressed.  We followed her safety sutures down to the button.  The hardware was removed.  Once the button was removed we placed a guidewire in the form of 2.4 Beath pin and a freehand technique and were able to identify the previous ACL tunnel.  We reamed with a barrel reamer clearing soft tissue debris as well as suture debris.  We used the scope to place in the tunnel to ensure that we have clean healthy walls.  Once this was complete we passed a passing suture through the tunnel.  We shuttled our  graft in retrograde confirming the placement of the Arthrex tight rope button on the lateral femoral cortex using fluoroscopy.  We confirmed that the button was the appropriate button by manipulating under live fluoroscopy.  Once this was complete we used the tight rope mechanism to pull her graft up into the joint.  We cycled the knee multiple times and checked that there was no impingement.  Once this was complete we left the internal brace sutures loose purposely and with the knee in about 20 degrees of flexion pulled maximal tension on the graft sutures.  We placed a 10 x 30 biocomposite Arthrex screw.  We ensured that the internal brace sutures were not tight at this point.  With the knee in full extension it was not captured.  We then backed up our fixation with a 4.75 peek swivel lock placed in the anterior-inferior portion of the tibial plateau.  Final construct demonstrated a good Lachman with no instability.  We irrigated the wound copiously before placing local antibiotic as listed above.  We closed the incision in a multilayer fashion with absorbable suture.  Sterile dressing was placed.  Knee immobilizer  placed.  Patient was awoken taken to PACU in stable condition.  Alfonse Alpers, PA-C, present and scrubbed throughout the case, critical for completion in a timely fashion, and for retraction, instrumentation, closure.

## 2022-08-23 NOTE — Progress Notes (Signed)
Assisted Dr. Hodierne with left, adductor canal, ultrasound guided block. Side rails up, monitors on throughout procedure. See vital signs in flow sheet. Tolerated Procedure well. 

## 2022-08-23 NOTE — Transfer of Care (Signed)
Immediate Anesthesia Transfer of Care Note  Patient: Grisell L Pedregon  Procedure(s) Performed: KNEE ARTHROSCOPY WITH ANTERIOR CRUCIATE LIGAMENT (ACL) RECONSTRUCTION WITH TIBIAL ANTERIOR ALLOGRAFT (Left: Knee) SYNOVECTOMY (Left: Knee)  Patient Location: PACU  Anesthesia Type:GA combined with regional for post-op pain  Level of Consciousness: sedated  Airway & Oxygen Therapy: Patient Spontanous Breathing and Patient connected to face mask oxygen  Post-op Assessment: Report given to RN and Post -op Vital signs reviewed and stable  Post vital signs: Reviewed and stable  Last Vitals:  Vitals Value Taken Time  BP    Temp    Pulse    Resp    SpO2      Last Pain:  Vitals:   08/23/22 0715  TempSrc: Oral  PainSc: 0-No pain      Patients Stated Pain Goal: 3 (08/23/22 0715)  Complications: No notable events documented.

## 2022-08-23 NOTE — Interval H&P Note (Signed)
All questions answered, patient wants to proceed with procedure. ? ?

## 2022-08-23 NOTE — Anesthesia Preprocedure Evaluation (Signed)
Anesthesia Evaluation  Patient identified by MRN, date of birth, ID band Patient awake    Reviewed: Allergy & Precautions, H&P , NPO status , Patient's Chart, lab work & pertinent test results  Airway Mallampati: II   Neck ROM: full    Dental   Pulmonary neg pulmonary ROS   breath sounds clear to auscultation       Cardiovascular hypertension,  Rhythm:regular Rate:Normal     Neuro/Psych  Headaches  Neuromuscular disease    GI/Hepatic ,GERD  ,,  Endo/Other    Renal/GU      Musculoskeletal  (+) Arthritis ,    Abdominal   Peds  Hematology   Anesthesia Other Findings   Reproductive/Obstetrics                             Anesthesia Physical Anesthesia Plan  ASA: 2  Anesthesia Plan: General   Post-op Pain Management: Regional block*   Induction: Intravenous  PONV Risk Score and Plan: 3 and Ondansetron, Dexamethasone, Midazolam and Treatment may vary due to age or medical condition  Airway Management Planned: LMA  Additional Equipment:   Intra-op Plan:   Post-operative Plan: Extubation in OR  Informed Consent: I have reviewed the patients History and Physical, chart, labs and discussed the procedure including the risks, benefits and alternatives for the proposed anesthesia with the patient or authorized representative who has indicated his/her understanding and acceptance.     Dental advisory given  Plan Discussed with: CRNA, Anesthesiologist and Surgeon  Anesthesia Plan Comments:        Anesthesia Quick Evaluation

## 2022-08-23 NOTE — Anesthesia Procedure Notes (Signed)
Anesthesia Regional Block: Adductor canal block   Pre-Anesthetic Checklist: , timeout performed,  Correct Patient, Correct Site, Correct Laterality,  Correct Procedure, Correct Position, site marked,  Risks and benefits discussed,  Surgical consent,  Pre-op evaluation,  At surgeon's request and post-op pain management  Laterality: Left  Prep: chloraprep       Needles:  Injection technique: Single-shot  Needle Type: Echogenic Needle     Needle Length: 9cm  Needle Gauge: 21     Additional Needles:   Narrative:  Start time: 08/23/2022 8:00 AM End time: 08/23/2022 8:10 AM Injection made incrementally with aspirations every 5 mL.  Performed by: Personally  Anesthesiologist: Achille Rich, MD  Additional Notes: Pt tolerated the procedure well.

## 2022-08-23 NOTE — Anesthesia Procedure Notes (Signed)
Procedure Name: LMA Insertion Date/Time: 08/23/2022 8:35 AM  Performed by: Burna Cash, CRNAPre-anesthesia Checklist: Patient identified, Emergency Drugs available, Suction available and Patient being monitored Patient Re-evaluated:Patient Re-evaluated prior to induction Oxygen Delivery Method: Circle system utilized Preoxygenation: Pre-oxygenation with 100% oxygen Induction Type: IV induction Ventilation: Mask ventilation without difficulty LMA: LMA inserted LMA Size: 4.0 Number of attempts: 1 Airway Equipment and Method: Bite block Placement Confirmation: positive ETCO2 Tube secured with: Tape Dental Injury: Teeth and Oropharynx as per pre-operative assessment

## 2022-08-23 NOTE — Anesthesia Postprocedure Evaluation (Signed)
Anesthesia Post Note  Patient: Kathy White  Procedure(s) Performed: KNEE ARTHROSCOPY WITH ANTERIOR CRUCIATE LIGAMENT (ACL) RECONSTRUCTION WITH TIBIAL ANTERIOR ALLOGRAFT (Left: Knee) SYNOVECTOMY (Left: Knee)     Patient location during evaluation: PACU Anesthesia Type: General and Regional Level of consciousness: awake and alert Pain management: pain level controlled Vital Signs Assessment: post-procedure vital signs reviewed and stable Respiratory status: spontaneous breathing, nonlabored ventilation, respiratory function stable and patient connected to nasal cannula oxygen Cardiovascular status: blood pressure returned to baseline and stable Postop Assessment: no apparent nausea or vomiting Anesthetic complications: no   No notable events documented.  Last Vitals:  Vitals:   08/23/22 1100 08/23/22 1114  BP: (!) 148/84 (!) 143/88  Pulse: 66 65  Resp: 12 14  Temp:    SpO2: 100% 100%    Last Pain:  Vitals:   08/23/22 1114  TempSrc:   PainSc: 3                  Keierra Nudo S

## 2022-08-24 ENCOUNTER — Encounter (HOSPITAL_BASED_OUTPATIENT_CLINIC_OR_DEPARTMENT_OTHER): Payer: Self-pay | Admitting: Orthopaedic Surgery

## 2022-08-30 NOTE — Therapy (Addendum)
OUTPATIENT PHYSICAL THERAPY LOWER EXTREMITY EVALUATION   Patient Name: Kathy White BRASSELL MRN: [id number] DOB:05-14-1980, 42 y.o., female Today's Date: 09/03/2022  END OF SESSION:  PT End of Session - 09/03/22 1549     Visit Number 1    Number of Visits 25    Date for PT Re-Evaluation 10/29/22    Authorization Type [place] medicaid healthy blue    Authorization Time Period auth tbd    PT Start Time 1550    PT Stop Time 1625    PT Time Calculation (min) 35 min    Activity Tolerance Patient tolerated treatment well;No increased pain    Behavior During Therapy [facility] for tasks assessed/performed             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   Hematuria 04/21/2013   History of abnormal cervical Pap smear    History of HPV infection    Hypertension    IBS (irritable bowel syndrome)    Left ACL tear    Migraine    OA (osteoarthritis) of knee    Obesity    Pain of joint of left ankle and foot 10/08/2019   PCO (polycystic ovaries)    Pelvic pain 09/16/2018   S/P ACL reconstruction 03/22/2015   Stiffness of left knee 09/25/2021   Vaginal Pap smear, abnormal    Vitamin D deficiency    Past Surgical History:  Procedure Laterality Date   BREAST BIOPSY  08/07/2022   MM RT RADIOACTIVE SEED LOC MAMMO GUIDE 08/07/2022 [facility] MAMMOGRAPHY   BREAST LUMPECTOMY WITH RADIOACTIVE SEED LOCALIZATION Right 08/08/2022   Procedure: RIGHT BREAST LUMPECTOMY WITH RADIOACTIVE SEED LOCALIZATION;  Surgeon: Griselda Miner, MD;  Location: [facility];   Service: General;  Laterality: Right;   CARPAL TUNNEL RELEASE Right 04/28/2021   CERVICAL BIOPSY  W/ LOOP ELECTRODE EXCISION     CESAREAN SECTION  11/14/2004   EXAM UNDER ANESTHESIA WITH MANIPULATION OF KNEE Left 03/22/2015   Procedure: LEFT KNEE EXAM UNDER ANESTHESIA  ;  Surgeon: Eugenia Mcalpine, MD;  Location: [facility];  Service: Orthopedics;  Laterality: Left;   HAND WOUND EXPLORATION AND TENDON REPAIR'S Right 10/12/2014   right ring and index fingers   KNEE ARTHROSCOPY W/ PARTIAL MEDIAL MENISCECTOMY Left 09/23/2021   KNEE ARTHROSCOPY WITH ANTERIOR CRUCIATE LIGAMENT (ACL) REPAIR WITH HAMSTRING GRAFT Left 03/22/2015   Procedure: LEFT KNEE ARTHROSCOPY WITH ANTERIOR CRUCIATE LIGAMENT (ACL) AUTOGRAFT RECONSTRUCTION;  Surgeon: Eugenia Mcalpine, MD;  Location: [facility];  Service: Orthopedics;  Laterality: Left;  ANESTHESIA: GENERAL/ABDUCTOR CANAL BLOCK   KNEE SURGERY  09/2021   LAPAROSCOPIC CHOLECYSTECTOMY  12/20/2004   LAPAROSCOPIC GASTRIC BAND REMOVAL WITH LAPAROSCOPIC GASTRIC SLEEVE RESECTION  09/09/2017   MENISECTOMY Left 03/22/2015   Procedure: PARTIAL LEFT KNEE  LATERAL MENISECTOMY, CHONDROPLASTY;  Surgeon: Eugenia Mcalpine, MD;  Location: [facility];  Service: Orthopedics;  Laterality: Left;   NERVE, TENDON AND ARTERY REPAIR Right 10/12/2014   Procedure: RIGHT RING FINGER AND RIGHT  SMALL FINGER WOUND EXPLORATION WITH TENDON REPAIRS;  Surgeon: Bradly Bienenstock, MD;  Location: [facility];  Service: Orthopedics;  Laterality: Right;   SYNOVECTOMY Left 08/23/2022   Procedure: SYNOVECTOMY;  Surgeon: Bjorn Pippin, MD;  Location: [facility];  Service: Orthopedics;  Laterality: Left;   WISDOM TOOTH EXTRACTION     Patient Active Problem List   Diagnosis Date Noted   Genetic testing 08/14/2022   Family history of uterine cancer 08/02/2022   Family history of BRCA1 gene positive 08/02/2022   Dysuria 06/11/2022   Burning with urination  05/22/2022   Urinary frequency 05/22/2022   Gastro-esophageal reflux disease without esophagitis 03/08/2022   Neuropathy 03/08/2022   Generalized abdominal pain 03/07/2022   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   Right facial numbness 01/11/2021   Carpal tunnel syndrome of right wrist 01/05/2021   Numbness of hand 01/05/2021   Mass of upper outer quadrant of right breast 07/21/2020   Pain of joint of left ankle and foot 10/08/2019   Arthritis of both knees 10/02/2019   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   Prediabetes 05/30/2017   Vitamin D deficiency 05/30/2017   PCOS (polycystic ovarian syndrome) 01/11/2016   Fibroid 09/15/2015   S/P ACL reconstruction 03/22/2015   Hypertension 03/31/2013   IUD (intrauterine device) in place 02/26/2013   FH: breast cancer in first degree relative 02/26/2013   Leg edema 11/21/2012   Migraine 07/31/2012    PCP: Dois Davenport, MD  REFERRING PROVIDER: Vernetta Honey, PA-C   REFERRING DIAG: Left knee revision ACL reconstruction with allograft 08/23/22   THERAPY DIAG:  Left knee pain, unspecified chronicity  Stiffness of left knee, not elsewhere classified  Other abnormalities of gait and mobility  Localized edema  Rationale for Evaluation and Treatment: Rehabilitation  ONSET DATE: 08/23/22 op date  SUBJECTIVE:   SUBJECTIVE STATEMENT: Pt arrives post op day 11 R ACLR with tibial anterior allograft.  Pt states pain has been relatively good since surgery, does still have some swelling. Saw surgeon last week, still has some of her steri strips. States she was initially using a brace but was discharged from it at most recent surgeon visit. Having some popping but states it is relieving, nonpainful. States she initially used a walker, hasn't been using it for a few days. States she has had to modify her stair navigation but otherwise getting around relatively well.   PERTINENT HISTORY: Migraines, prior L ACL  repair  PAIN:  Are you having pain: no pain Location/description: L knee Best-worst over past week: denies any pain since POD1  - aggravating factors: being on feet for a while, heat - Easing factors: icing, medication    PRECAUTIONS: s/p R ACLR [facility] Protocol, refer to media tab for details) Phase 1 0-6weeks post op: WBAT, goal is to be without assist by POD10 Knee immobilizer for 1 week with sleep, may be weaned during ambulation once able to do SLR without lag ROM as tolerated, goals of full extension by week 2, 120 deg flexion by week 6    WEIGHT BEARING RESTRICTIONS: No  FALLS:  Has patient fallen in last 6 months? 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 Mild bruising about lateral incision. No erythema or excessive warmth  PALPATION: No TTP quad or surgical site, patellar mobility is confounded by edema but appears grossly WNL, no pain  LOWER EXTREMITY ROM:     Active  Right eval Left eval  Hip flexion    Hip extension    Hip internal rotation    Hip external rotation    Knee extension 0 deg Lacking 5 deg  Knee flexion 130 deg 80 deg (Able to achieve 90 deg post HEP)  (Blank rows = not tested) (Key: WFL = within functional limits not formally assessed, * = concordant pain, s =  stiffness/stretching sensation, NT = not tested)  Comments: above is painless  LOWER EXTREMITY MMT:    MMT Right eval Left eval  Hip flexion    Hip abduction (modified sitting)    Hip internal rotation    Hip external rotation    Knee flexion    Knee extension    Ankle dorsiflexion     (Blank rows = not tested) (Key: WFL = within functional limits not formally assessed, * = concordant pain, s = stiffness/stretching sensation, NT = not tested)  Comments: deferred on eval given recency of surgery  LOWER EXTREMITY SPECIAL TESTS:  Deferred given recency of surgery  FUNCTIONAL TESTS:  Sit to stand transfer: weight shift towards R, gentle UE support, mild anterior placement of LLE    GAIT: Distance walked: within clinic Assistive device utilized: None Level of assistance: Complete Independence Comments: antalgic gait LLE, reduced stance time and mildly increased trunk sway    TODAY'S TREATMENT:                                                                                                                              [facility] Adult PT Treatment:                                                DATE: 09/03/22 Therapeutic Exercise: QS with heel prop x10 SLR limited ROM x5 HEP handout + education  PATIENT EDUCATION:  Education details: Pt education on PT impairments, prognosis, and POC. Informed consent. Rationale for interventions, safe/appropriate HEP performance, safety w/ activity, icing  Person educated: Patient Education method: Explanation, Demonstration, Tactile cues, Verbal cues, and Handouts Education comprehension: verbalized understanding, returned demonstration, verbal cues required, tactile cues required, and needs further education    HOME EXERCISE PROGRAM: Access Code: [email or web address] URL:  [email or web address] Date: 09/03/2022 Prepared by: Fransisco Hertz  Exercises - Active Straight Leg Raise with Quad Set  - 2-3 x daily - 7 x weekly - 1 sets - 5 reps -  Long Sitting Quad Set with Towel Roll Under Heel  - 2-3 x daily - 7 x weekly - 1 sets - 10 reps  ASSESSMENT:  CLINICAL IMPRESSION: Pt is a pleasant 42 year old woman who arrives to PT evaluation on this date for knee pain s/p ACLR. Pt reports difficulty with functional mobility/ADL's due to post surgical healing/weakness. During today's session pt demonstrates limitations in gait, ROM, and functional mechanics as expected post op which are likely contributing to difficulty with aforementioned activities. Some delay in quad activation as expected post op but fair contraction, and able to perform SLR without lag in available ROM. Recommend skilled PT to address aforementioned deficits to improve functional independence/tolerance. No adverse events or pain during session, tolerates HEP well. Pt departs today's session in no acute distress, all voiced questions/concerns addressed appropriately from PT perspective.    OBJECTIVE IMPAIRMENTS: Abnormal gait, decreased activity tolerance, decreased endurance, decreased mobility, difficulty walking, decreased ROM, decreased strength, increased edema, impaired sensation, improper body mechanics, postural dysfunction, and pain.   ACTIVITY LIMITATIONS: carrying, lifting, bending, standing, squatting, stairs, transfers, and locomotion level  PARTICIPATION LIMITATIONS: meal prep, cleaning, laundry, community activity, and occupation  PERSONAL FACTORS: Past/current experiences and Time since onset of injury/illness/exacerbation are also affecting patient's functional outcome.   REHAB POTENTIAL: Good  CLINICAL DECISION MAKING: Evolving/moderate complexity  EVALUATION COMPLEXITY: Low   GOALS: Goals reviewed with patient? No  SHORT TERM GOALS: Target date: 10/15/2022 Pt will demonstrate appropriate understanding and performance of initially prescribed HEP in order to facilitate improved independence with management of symptoms.  Baseline: HEP provided on  eval Goal status: INITIAL   2. Pt will score greater than or equal to 65 on FOTO in order to demonstrate improved perception of function due to symptoms.  Baseline: 63  Goal status: INITIAL    LONG TERM GOALS: Target date: 11/26/2022 Pt will score 68 or greater on FOTO in order to demonstrate improved perception of function due to symptoms.  Baseline: 63 Goal status: INITIAL  2.  Pt will demonstrate at least 0-120 degrees of left knee AROM in order to facilitate improved tolerance to functional movements such as walking, stair navigation, and transfers.  Baseline: see ROM chart above Goal status: INITIAL  3.  Pt will demonstrate grossly symmetrical knee extension strength via MMT or handheld dynamometer in order to facilitate improved functional kinematics and maximize outcomes. Baseline: deferred given surgical acuity Goal status: INITIAL  4.  Pt will be able to perform 5 consecutive sit to stands without UE support and grossly WNL mechanics to improve safety w/ transfers.  Baseline: UE support for transfers w/ reduced WB through surgical limb Goal status: INITIAL   5. Pt will report ability to stand/walk for up to 1hr in order to maximize tolerance to community navigation and household tasks.  Baseline:  altered mechanics, increased swelling with standing/walking  Goal status: INITIAL   6. Pt will be able to safely navigate up to 5 stairs without UE support in order to facilitate improved independence with home access.   Baseline: altered mechanics, rail use  Goal status: INITIAL    PLAN:  PT FREQUENCY: 2x/week  PT DURATION: 12 weeks  PLANNED INTERVENTIONS: Therapeutic exercises, Therapeutic activity, Neuromuscular re-education, Balance training, Gait training, Patient/Family education, Self Care, Joint mobilization, Stair  training, Aquatic Therapy, Dry Needling, Electrical stimulation, Cryotherapy, Moist heat, scar mobilization, Taping, Manual therapy, and  Re-evaluation  PLAN FOR NEXT SESSION: Review/update HEP PRN. Work on quad activation, ROM; refer to [employer] protocol (see media tab). Symptom modification strategies as indicated/appropriate.    Ashley Murrain PT, DPT 09/03/2022 5:01 PM   Addendum for Jerilee Field submission:  Check all possible CPT codes: 66440 - PT Re-evaluation, 97110- Therapeutic Exercise, [phone]- Neuro Re-education, [phone] - Gait Training, [phone] - Manual Therapy, [phone] - Therapeutic Activities, [phone] - Self Care, [phone] - Physical performance training, and [phone] - Aquatic therapy    Check all conditions that are expected to impact treatment: {Conditions expected to impact treatment:Musculoskeletal disorders   If treatment provided at initial evaluation, no treatment charged due to lack of authorization.

## 2022-09-03 ENCOUNTER — Other Ambulatory Visit: Payer: Self-pay

## 2022-09-03 ENCOUNTER — Ambulatory Visit: Payer: Medicaid Other | Attending: Physician Assistant | Admitting: Physical Therapy

## 2022-09-03 ENCOUNTER — Encounter: Payer: Self-pay | Admitting: Physical Therapy

## 2022-09-03 DIAGNOSIS — R600 Localized edema: Secondary | ICD-10-CM | POA: Insufficient documentation

## 2022-09-03 DIAGNOSIS — M25662 Stiffness of left knee, not elsewhere classified: Secondary | ICD-10-CM | POA: Insufficient documentation

## 2022-09-03 DIAGNOSIS — M25562 Pain in left knee: Secondary | ICD-10-CM | POA: Diagnosis present

## 2022-09-03 DIAGNOSIS — R2689 Other abnormalities of gait and mobility: Secondary | ICD-10-CM | POA: Diagnosis present

## 2022-09-04 ENCOUNTER — Encounter: Payer: Self-pay | Admitting: Physical Therapy

## 2022-09-04 ENCOUNTER — Ambulatory Visit: Payer: Medicaid Other | Admitting: Physical Therapy

## 2022-09-04 DIAGNOSIS — M25662 Stiffness of left knee, not elsewhere classified: Secondary | ICD-10-CM

## 2022-09-04 DIAGNOSIS — R2689 Other abnormalities of gait and mobility: Secondary | ICD-10-CM

## 2022-09-04 DIAGNOSIS — M25562 Pain in left knee: Secondary | ICD-10-CM | POA: Diagnosis not present

## 2022-09-04 DIAGNOSIS — R600 Localized edema: Secondary | ICD-10-CM

## 2022-09-04 NOTE — Therapy (Signed)
OUTPATIENT PHYSICAL THERAPY LOWER EXTREMITY EVALUATION   Patient Name: Tajuana NNEOMA HARRAL MRN: [id number] DOB:03-09-1981, 42 y.o., female Today's Date: 09/04/2022  END OF SESSION:  PT End of Session - 09/04/22 1506     Visit Number 2    Number of Visits 25    Date for PT Re-Evaluation 10/29/22    Authorization Type [place] medicaid [employer] blue    Authorization Time Period 09/04/2022 -    Authorization - Visit Number 1    Authorization - Number of Visits 12    PT Start Time 1505    Activity Tolerance Patient tolerated treatment well;No increased pain              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   Hematuria 04/21/2013   History of abnormal cervical Pap smear    History of HPV infection    Hypertension    IBS (irritable bowel syndrome)    Left ACL tear    Migraine    OA (osteoarthritis) of knee    Obesity    Pain of joint of left ankle and foot 10/08/2019   PCO (polycystic ovaries)    Pelvic pain 09/16/2018   S/P ACL reconstruction 03/22/2015   Stiffness of left knee 09/25/2021   Vaginal Pap smear, abnormal    Vitamin D deficiency    Past Surgical History:  Procedure Laterality Date   BREAST BIOPSY  08/07/2022   MM RT RADIOACTIVE SEED LOC MAMMO GUIDE 08/07/2022 [facility] MAMMOGRAPHY   BREAST LUMPECTOMY WITH RADIOACTIVE SEED LOCALIZATION Right 08/08/2022   Procedure: RIGHT BREAST LUMPECTOMY WITH RADIOACTIVE SEED LOCALIZATION;  Surgeon: Griselda Miner, MD;  Location: [facility];  Service: General;  Laterality: Right;    CARPAL TUNNEL RELEASE Right 04/28/2021   CERVICAL BIOPSY  W/ LOOP ELECTRODE EXCISION     CESAREAN SECTION  11/14/2004   EXAM UNDER ANESTHESIA WITH MANIPULATION OF KNEE Left 03/22/2015   Procedure: LEFT KNEE EXAM UNDER ANESTHESIA  ;  Surgeon: Eugenia Mcalpine, MD;  Location: [facility];  Service: Orthopedics;  Laterality: Left;   HAND WOUND EXPLORATION AND TENDON REPAIR'S Right 10/12/2014   right ring and index fingers   KNEE ARTHROSCOPY W/ PARTIAL MEDIAL MENISCECTOMY Left 09/23/2021   KNEE ARTHROSCOPY WITH ANTERIOR CRUCIATE LIGAMENT (ACL) REPAIR WITH HAMSTRING GRAFT Left 03/22/2015   Procedure: LEFT KNEE ARTHROSCOPY WITH ANTERIOR CRUCIATE LIGAMENT (ACL) AUTOGRAFT RECONSTRUCTION;  Surgeon: Eugenia Mcalpine, MD;  Location: [facility];  Service: Orthopedics;  Laterality: Left;  ANESTHESIA: GENERAL/ABDUCTOR CANAL BLOCK   KNEE SURGERY  09/2021   LAPAROSCOPIC CHOLECYSTECTOMY  12/20/2004   LAPAROSCOPIC GASTRIC BAND REMOVAL WITH LAPAROSCOPIC GASTRIC SLEEVE RESECTION  09/09/2017   MENISECTOMY Left 03/22/2015   Procedure: PARTIAL LEFT KNEE  LATERAL MENISECTOMY, CHONDROPLASTY;  Surgeon: Eugenia Mcalpine, MD;  Location: [facility] [place];  Service: Orthopedics;  Laterality: Left;   NERVE, TENDON AND ARTERY REPAIR Right 10/12/2014   Procedure: RIGHT RING FINGER AND RIGHT SMALL FINGER WOUND EXPLORATION WITH TENDON REPAIRS;  Surgeon: Bradly Bienenstock, MD;  Location: [facility];  Service: Orthopedics;  Laterality: Right;   SYNOVECTOMY Left 08/23/2022   Procedure: SYNOVECTOMY;  Surgeon: Bjorn Pippin, MD;  Location: [facility];  Service: Orthopedics;  Laterality: Left;   WISDOM TOOTH EXTRACTION     Patient Active Problem List   Diagnosis Date Noted   Genetic testing 08/14/2022   Family history of uterine cancer 08/02/2022   Family history of BRCA1 gene positive 08/02/2022   Dysuria 06/11/2022   Burning with urination 05/22/2022   Urinary frequency 05/22/2022    Gastro-esophageal reflux disease without esophagitis 03/08/2022   Neuropathy 03/08/2022   Generalized abdominal pain 03/07/2022   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   Right facial numbness 01/11/2021   Carpal tunnel syndrome of right wrist 01/05/2021   Numbness of hand 01/05/2021   Mass of upper outer quadrant of right breast 07/21/2020   Pain of joint of left ankle and foot 10/08/2019   Arthritis of both knees 10/02/2019   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   Prediabetes 05/30/2017   Vitamin D deficiency 05/30/2017   PCOS (polycystic ovarian syndrome) 01/11/2016   Fibroid 09/15/2015   S/P ACL reconstruction 03/22/2015   Hypertension 03/31/2013   IUD (intrauterine device) in place 02/26/2013   FH: breast cancer in first degree relative 02/26/2013   Leg edema 11/21/2012   Migraine 07/31/2012    PCP: Dois Davenport, MD  REFERRING PROVIDER: Vernetta Honey, PA-C   REFERRING DIAG: Left knee revision ACL reconstruction with allograft 08/23/22   THERAPY DIAG:  Left knee pain, unspecified chronicity  Stiffness of left knee, not elsewhere classified  Other abnormalities of gait and mobility  Localized edema  Rationale for Evaluation and Treatment: Rehabilitation  ONSET DATE: 08/23/22 op date  SUBJECTIVE:   SUBJECTIVE STATEMENT: " No issues besides tenderness at the surgery site."  PERTINENT HISTORY: Migraines, prior L ACL repair   PAIN:  Are you having pain: no pain Location/description: L knee Best-worst over past week: denies any pain since POD1  - aggravating factors: being on feet for a while, heat - Easing factors: icing, medication    PRECAUTIONS: s/p R ACLR [facility] Protocol, refer to media tab for details) Phase 1 0-6weeks post op: WBAT, goal is to be without assist by POD10 Knee immobilizer for 1 week with sleep, may be weaned during ambulation once able to do SLR without lag ROM as tolerated, goals of full extension by  week 2, 120 deg flexion by week 6    WEIGHT BEARING RESTRICTIONS: No  FALLS:  Has patient fallen in last 6 months? 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 Mild bruising about lateral incision. No erythema or excessive warmth  PALPATION: No TTP quad or surgical site, patellar mobility is confounded by edema but  appears grossly WNL, no pain  LOWER EXTREMITY ROM:     Active  Right eval Left eval Left 09/04/2022  Hip flexion     Hip extension     Hip internal rotation     Hip external rotation     Knee extension 0 deg Lacking 5 deg 3  Knee flexion 130 deg 80 deg (Able to achieve 90 deg post HEP) 118  (Blank rows = not tested) (Key: WFL = within functional limits not formally assessed, * = concordant pain, s = stiffness/stretching sensation, NT = not tested)  Comments: above is painless  LOWER EXTREMITY MMT:    MMT Right eval Left eval  Hip flexion    Hip abduction (modified sitting)    Hip internal rotation    Hip external rotation    Knee flexion    Knee extension    Ankle dorsiflexion     (Blank rows = not tested) (Key: WFL = within functional limits not formally assessed, * = concordant pain, s = stiffness/stretching sensation, NT = not tested)  Comments: deferred on eval given recency  of surgery  LOWER EXTREMITY SPECIAL TESTS:  Deferred given recency of surgery  FUNCTIONAL TESTS:  Sit to stand transfer: weight shift towards R, gentle UE support, mild anterior placement of LLE    GAIT: Distance walked: within clinic Assistive device utilized: None Level of assistance: Complete Independence Comments: antalgic gait LLE, reduced stance time and mildly increased trunk sway    TODAY'S TREATMENT:                                                                                                                              [facility] Adult PT Treatment:                                                DATE: 09/04/2022 Therapeutic Exercise: Recumbent bike L1 x 6 min backward x 2 min and forward 4 min  Prone quad stretch 2 x 30 sec with strap Supine hamstring stretch 1 x 30 sec Quad set with towel in popliteal space for quad activation 5 x 45 sec hold Sidelying hip abduction 1 x 15, 1 x 10 circles both CW/ CCW Mini wall squats 0-45 degrees 1 x 15   Updated HEP today for mini wall squats, sidelying hip abduciton prone quad stretch, hamstring stretch (supine and seated)    [facility] Adult PT Treatment:                                                DATE: 09/03/22 Therapeutic Exercise: QS with heel prop x10 SLR limited ROM x5 HEP handout + education  PATIENT EDUCATION:  Education  details: Pt education on PT impairments, prognosis, and POC. Informed consent. Rationale for interventions, safe/appropriate HEP performance, safety w/ activity, icing  Person educated: Patient Education method: Explanation, Demonstration, Tactile cues, Verbal cues, and Handouts Education comprehension: verbalized understanding, returned demonstration, verbal cues required, tactile cues required, and needs further education    HOME EXERCISE PROGRAM: Access Code: [email or web address] URL: [email or web address] Date: 09/04/2022 Prepared by: Lulu Riding  Exercises - Active Straight Leg Raise with  Quad Set  - 2-3 x daily - 7 x weekly - 1 sets - 5 reps - Long Sitting Quad Set with Towel Roll Under Heel  - 2-3 x daily - 7 x weekly - 1 sets - 10 reps - Supine Hamstring Stretch with Strap  - 1 x daily - 7 x weekly - 2 sets - 2 reps - 30 hold - Seated Hamstring Stretch  - 1 x daily - 7 x weekly - 2 sets - 2 reps - 30 hold - Wall Quarter Squat with Swiss Ball  - 1 x daily - 7 x weekly - 2 sets - 10 reps - Sidelying Hip Abduction  - 1 x daily - 7 x weekly - 3 sets - 10 reps  ASSESSMENT:  CLINICAL IMPRESSION: 6/25/2024Mrs Kist arrives to PT for her follow up after her initial PT evaluation. She is doing quite well with ROM improving her total arc ROM to 3- 119 degrees and denies any pain today. She have quad activation but it is limited compared bil. Held off on manual today and focused on stretching and strengthening staying consistent with MD's protocol. She did well with all exercises requiring verbal cues to address R lateral hip shifting with mini wall squat and did fatigue quickly with hip abduction circles. End of session she denied any pain or modalities.     Evaluation: Pt is a pleasant 42 year old woman who arrives to PT evaluation on this date for knee pain s/p ACLR. Pt reports difficulty with functional mobility/ADL's due to post surgical healing/weakness. During today's session pt demonstrates limitations in gait, ROM, and functional mechanics as expected post op which are likely contributing to difficulty with aforementioned activities. Some delay in quad activation as expected post op but fair contraction, and able to perform SLR without lag in available ROM. Recommend skilled PT to address aforementioned deficits to improve functional independence/tolerance. No adverse events or pain during session, tolerates HEP well. Pt departs today's session in no acute distress, all voiced questions/concerns addressed appropriately from PT perspective.    OBJECTIVE IMPAIRMENTS: Abnormal gait,  decreased activity tolerance, decreased endurance, decreased mobility, difficulty walking, decreased ROM, decreased strength, increased edema, impaired sensation, improper body mechanics, postural dysfunction, and pain.   ACTIVITY LIMITATIONS: carrying, lifting, bending, standing, squatting, stairs, transfers, and locomotion level  PARTICIPATION LIMITATIONS: meal prep, cleaning, laundry, community activity, and occupation  PERSONAL FACTORS: Past/current experiences and Time since onset of injury/illness/exacerbation are also affecting patient's functional outcome.   REHAB POTENTIAL: Good  CLINICAL DECISION MAKING: Evolving/moderate complexity  EVALUATION COMPLEXITY: Low   GOALS: Goals reviewed with patient? No  SHORT TERM GOALS: Target date: 10/15/2022 Pt will demonstrate appropriate understanding and performance of initially prescribed HEP in order to facilitate improved independence with management of symptoms.  Baseline: HEP provided on eval Goal status: INITIAL   2. Pt will score greater than or equal to 65 on FOTO in order to demonstrate improved perception of function due to symptoms.  Baseline: 63  Goal status: INITIAL    LONG TERM  GOALS: Target date: 11/26/2022  Pt will score 68 or greater on FOTO in order to demonstrate improved perception of function due to symptoms.  Baseline: 63 Goal status: INITIAL  2.  Pt will demonstrate at least 0-120 degrees of left knee AROM in order to facilitate improved tolerance to functional movements such as walking, stair navigation, and transfers.  Baseline: see ROM chart above Goal status: INITIAL  3.  Pt will demonstrate grossly symmetrical knee extension strength via MMT or handheld dynamometer in order to facilitate improved functional kinematics and maximize outcomes. Baseline: deferred given surgical acuity Goal status: INITIAL  4.  Pt will be able to perform 5 consecutive sit to stands without UE support and grossly WNL mechanics  to improve safety w/ transfers.  Baseline: UE support for transfers w/ reduced WB through surgical limb Goal status: INITIAL   5. Pt will report ability to stand/walk for up to 1hr in order to maximize tolerance to community navigation and household tasks.  Baseline:  altered mechanics, increased swelling with standing/walking  Goal status: INITIAL   6. Pt will be able to safely navigate up to 5 stairs without UE support in order to facilitate improved independence with home access.   Baseline: altered mechanics, rail use  Goal status: INITIAL    PLAN:  PT FREQUENCY: 2x/week  PT DURATION: 12 weeks  PLANNED INTERVENTIONS: Therapeutic exercises, Therapeutic activity, Neuromuscular re-education, Balance training, Gait training, Patient/Family education, Self Care, Joint mobilization, Stair training, Aquatic Therapy, Dry Needling, Electrical stimulation, Cryotherapy, Moist heat, scar mobilization, Taping, Manual therapy, and Re-evaluation  PLAN FOR NEXT SESSION: Review/update HEP PRN. Work on quad activation, ROM; refer to [employer] protocol (see media tab). Gait training, gross hip/ ankle strengthening.   Eward Rutigliano PT, DPT, LAT, ATC  09/04/22  4:13 PM

## 2022-09-06 ENCOUNTER — Ambulatory Visit: Payer: Medicaid Other

## 2022-09-06 DIAGNOSIS — R2689 Other abnormalities of gait and mobility: Secondary | ICD-10-CM

## 2022-09-06 DIAGNOSIS — R600 Localized edema: Secondary | ICD-10-CM

## 2022-09-06 DIAGNOSIS — M25662 Stiffness of left knee, not elsewhere classified: Secondary | ICD-10-CM

## 2022-09-06 DIAGNOSIS — M25562 Pain in left knee: Secondary | ICD-10-CM | POA: Diagnosis not present

## 2022-09-06 NOTE — Therapy (Signed)
OUTPATIENT PHYSICAL THERAPY TREATMENT NOTE   Patient Name: Kathy White MRN: [id number] DOB:1980/10/08, 42 y.o., female Today's Date: 09/06/2022  END OF SESSION:  PT End of Session - 09/06/22 1444     Visit Number 3    Number of Visits 25    Date for PT Re-Evaluation 10/29/22    Authorization Type [place] [employer] blue    Authorization Time Period 09/04/2022 -12/02/2022    Authorization - Visit Number 2    Authorization - Number of Visits 12    PT Start Time 1445    PT Stop Time 1530    PT Time Calculation (min) 45 min    Activity Tolerance Patient tolerated treatment well;No increased pain    Behavior During Therapy [facility] for tasks assessed/performed               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   Hematuria 04/21/2013   History of abnormal cervical Pap smear    History of HPV infection    Hypertension    IBS (irritable bowel syndrome)    Left ACL tear    Migraine    OA (osteoarthritis) of knee    Obesity    Pain of joint of left ankle and foot 10/08/2019   PCO (polycystic ovaries)    Pelvic pain 09/16/2018   S/P ACL reconstruction 03/22/2015   Stiffness of left knee 09/25/2021   Vaginal Pap smear, abnormal    Vitamin D deficiency    Past Surgical History:  Procedure Laterality Date   BREAST BIOPSY  08/07/2022   MM RT RADIOACTIVE SEED LOC MAMMO GUIDE 08/07/2022 [facility] MAMMOGRAPHY   BREAST LUMPECTOMY WITH RADIOACTIVE SEED LOCALIZATION Right 08/08/2022   Procedure: RIGHT BREAST LUMPECTOMY WITH RADIOACTIVE SEED  LOCALIZATION;  Surgeon: Griselda Miner, MD;  Location: [facility];  Service: General;  Laterality: Right;   CARPAL TUNNEL RELEASE Right 04/28/2021   CERVICAL BIOPSY  W/ LOOP ELECTRODE EXCISION     CESAREAN SECTION  11/14/2004   EXAM UNDER ANESTHESIA WITH MANIPULATION OF KNEE Left 03/22/2015   Procedure: LEFT KNEE EXAM UNDER ANESTHESIA  ;  Surgeon: Eugenia Mcalpine, MD;  Location: [facility];  Service: Orthopedics;  Laterality: Left;   HAND WOUND EXPLORATION AND TENDON REPAIR'S Right 10/12/2014   right ring and index fingers   KNEE ARTHROSCOPY W/ PARTIAL MEDIAL MENISCECTOMY Left 09/23/2021   KNEE ARTHROSCOPY WITH ANTERIOR CRUCIATE LIGAMENT (ACL) REPAIR WITH HAMSTRING GRAFT Left 03/22/2015   Procedure: LEFT KNEE ARTHROSCOPY WITH ANTERIOR CRUCIATE LIGAMENT (ACL) AUTOGRAFT RECONSTRUCTION;  Surgeon: Eugenia Mcalpine, MD;  Location: [facility] [place];  Service: Orthopedics;  Laterality: Left;  ANESTHESIA: GENERAL/ABDUCTOR CANAL BLOCK   KNEE SURGERY  09/2021   LAPAROSCOPIC CHOLECYSTECTOMY  12/20/2004   LAPAROSCOPIC GASTRIC BAND REMOVAL WITH LAPAROSCOPIC GASTRIC SLEEVE RESECTION  09/09/2017   MENISECTOMY Left 03/22/2015   Procedure: PARTIAL LEFT KNEE  LATERAL MENISECTOMY, CHONDROPLASTY;  Surgeon: Eugenia Mcalpine, MD;  Location: [facility];  Service: Orthopedics;  Laterality:  Left;   NERVE, TENDON AND ARTERY REPAIR Right 10/12/2014   Procedure: RIGHT RING FINGER AND RIGHT SMALL FINGER WOUND EXPLORATION WITH TENDON REPAIRS;  Surgeon: Bradly Bienenstock, MD;  Location: [facility];  Service: Orthopedics;  Laterality: Right;   SYNOVECTOMY Left 08/23/2022   Procedure: SYNOVECTOMY;  Surgeon: Bjorn Pippin, MD;  Location: [facility];  Service: Orthopedics;  Laterality: Left;   WISDOM TOOTH EXTRACTION     Patient Active Problem List   Diagnosis Date Noted   Genetic testing 08/14/2022   Family history of uterine cancer 08/02/2022   Family history of BRCA1  gene positive 08/02/2022   Dysuria 06/11/2022   Burning with urination 05/22/2022   Urinary frequency 05/22/2022   Gastro-esophageal reflux disease without esophagitis 03/08/2022   Neuropathy 03/08/2022   Generalized abdominal pain 03/07/2022   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   Right facial numbness 01/11/2021   Carpal tunnel syndrome of right wrist 01/05/2021   Numbness of hand 01/05/2021   Mass of upper outer quadrant of right breast 07/21/2020   Pain of joint of left ankle and foot 10/08/2019   Arthritis of both knees 10/02/2019   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   Prediabetes 05/30/2017   Vitamin D deficiency 05/30/2017   PCOS (polycystic ovarian syndrome) 01/11/2016   Fibroid 09/15/2015   S/P ACL reconstruction 03/22/2015   Hypertension 03/31/2013   IUD (intrauterine device) in place 02/26/2013   FH: breast cancer in first degree relative 02/26/2013   Leg edema 11/21/2012   Migraine 07/31/2012    PCP: Dois Davenport, MD  REFERRING PROVIDER: Vernetta Honey, PA-C   REFERRING DIAG: Left knee revision ACL reconstruction with allograft 08/23/22   THERAPY DIAG:  Left knee pain, unspecified chronicity  Localized edema  Stiffness of left knee, not elsewhere classified  Other abnormalities of gait and mobility  Rationale for Evaluation and Treatment: Rehabilitation  ONSET DATE: 08/23/22 op date  SUBJECTIVE:   SUBJECTIVE STATEMENT: Patient denies presence of pain today "just that rubberband pulling feeling."   PERTINENT HISTORY: Migraines, prior L ACL repair   PAIN:  Are you having pain: no pain Location/description: L knee Best-worst over past week: denies any pain since POD1  - aggravating factors: being on feet for a while, heat - Easing factors: icing, medication    PRECAUTIONS: s/p R ACLR [facility] Protocol, refer to media tab for details) Phase 1 0-6weeks post op: WBAT, goal is to be without assist by POD10 Knee  immobilizer for 1 week with sleep, may be weaned during ambulation once able to do SLR without lag ROM as tolerated, goals of full extension by week 2, 120 deg flexion by week 6    WEIGHT BEARING RESTRICTIONS: No  FALLS:  Has patient fallen in last 6 months? 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 Mild bruising about lateral incision. No erythema or excessive warmth  PALPATION: No TTP quad or surgical site, patellar mobility is confounded by edema but appears grossly WNL, no pain  LOWER EXTREMITY ROM:     Active  Right eval Left eval Left 09/04/2022  Hip flexion     Hip extension     Hip internal rotation     Hip external rotation     Knee extension 0 deg Lacking 5 deg 3  Knee flexion 130 deg 80 deg (Able to achieve 90 deg post HEP) 118  (Blank rows = not tested) (Key: WFL = within functional limits not formally assessed, * = concordant pain, s = stiffness/stretching sensation, NT = not tested)  Comments: above is painless  LOWER EXTREMITY MMT:    MMT Right eval Left eval  Hip flexion    Hip abduction (modified sitting)    Hip internal rotation    Hip external rotation    Knee flexion    Knee extension    Ankle dorsiflexion     (Blank rows = not tested) (Key: WFL = within functional  limits not formally assessed, * = concordant pain, s = stiffness/stretching sensation, NT = not tested)  Comments: deferred on eval given recency of surgery  LOWER EXTREMITY SPECIAL TESTS:  Deferred given recency of surgery  FUNCTIONAL TESTS:  Sit to stand transfer: weight shift towards R, gentle UE support, mild anterior placement of LLE    GAIT: Distance walked: within clinic Assistive device utilized: None Level of assistance: Complete Independence Comments: antalgic gait LLE, reduced stance time and mildly increased trunk sway    TODAY'S TREATMENT:    [facility] Adult PT Treatment:                                                DATE: 09/06/2022  Therapeutic Exercise: Recumbent bike L1 x 8 min (backward x 4 min and forward x 4 min)  Prone quad stretch 3 x 30 sec with strap Supine hamstring stretch 3 x 30 sec Quad set with towel in popliteal space for quad activation 5 x 45 sec hold Sidelying hip abduction 1 x 15, 1 x 10 circles both CW/ CCW Mini wall squats 0-45 degrees 1 x 16  Supine glute bridge x 15                                                                                                                               [facility] Adult PT Treatment:                                                DATE: 09/04/2022 Therapeutic Exercise:  Recumbent bike L1 x 6 min backward x 2 min and forward 4 min  Prone quad stretch 2 x 30 sec with strap Supine hamstring stretch 1 x 30 sec Quad set with towel in popliteal space for quad activation 5 x 45 sec hold Sidelying hip abduction 1 x 15, 1 x 10 circles both CW/ CCW Mini wall squats 0-45 degrees 1 x 15   Updated HEP today for mini wall squats, sidelying hip abduciton prone quad stretch, hamstring stretch (supine and seated)    [facility] Adult PT Treatment:                                                DATE: 09/03/22 Therapeutic Exercise: QS with heel prop x10 SLR limited ROM x5 HEP handout + education  PATIENT EDUCATION:  Education  details: Pt education on PT impairments, prognosis, and POC. Informed consent. Rationale for interventions, safe/appropriate HEP performance, safety w/ activity, icing  Person educated: Patient Education method: Explanation, Demonstration, Tactile cues, Verbal cues, and Handouts Education comprehension: verbalized understanding, returned demonstration, verbal cues required, tactile cues required, and needs further education    HOME EXERCISE PROGRAM: Access Code: [email or web address] URL: [email or web address] Date: 09/04/2022 Prepared by: Lulu Riding  Exercises - Active Straight Leg Raise with Quad Set  - 2-3 x daily - 7 x weekly - 1 sets - 5 reps - Long Sitting Quad Set with Towel Roll Under Heel  - 2-3 x daily - 7 x weekly - 1 sets - 10 reps - Supine Hamstring Stretch with Strap  - 1 x daily - 7 x weekly - 2 sets - 2 reps - 30 hold - Seated Hamstring Stretch  - 1 x daily - 7 x weekly - 2 sets - 2 reps - 30 hold - Wall Quarter Squat with Swiss Ball  - 1 x daily - 7 x weekly - 2 sets - 10 reps - Sidelying Hip Abduction  - 1 x daily - 7 x weekly - 3 sets - 10 reps  ASSESSMENT:  CLINICAL IMPRESSION: Kathy White is doing very well with her exercises today and was able to begin supine glute bridge. She denies any increase in pain with exercises. She will continue to benefit on quad strengthening and BIL hip/ankle strengthening activities within surgeon's protocol.     Evaluation: Pt is a pleasant 42 year old woman who arrives to PT evaluation on this date for knee pain s/p ACLR. Pt reports difficulty with functional mobility/ADL's due to post surgical healing/weakness. During today's session pt demonstrates limitations in gait, ROM, and functional mechanics as expected post op which are likely contributing to difficulty with aforementioned activities. Some delay in quad activation as expected post op but fair contraction, and able to perform SLR without lag in available ROM. Recommend  skilled PT to address aforementioned deficits to improve functional independence/tolerance. No adverse events or pain during session, tolerates HEP well. Pt departs today's session in no acute distress, all voiced questions/concerns addressed appropriately from PT perspective.    OBJECTIVE IMPAIRMENTS: Abnormal gait, decreased activity tolerance, decreased endurance, decreased mobility, difficulty walking, decreased ROM, decreased strength, increased edema, impaired sensation, improper body mechanics, postural dysfunction, and pain.   ACTIVITY LIMITATIONS: carrying, lifting, bending, standing, squatting, stairs, transfers, and locomotion level  PARTICIPATION LIMITATIONS: meal prep, cleaning, laundry, community activity, and occupation  PERSONAL FACTORS: Past/current experiences and Time  since onset of injury/illness/exacerbation are also affecting patient's functional outcome.   REHAB POTENTIAL: Good  CLINICAL DECISION MAKING: Evolving/moderate complexity  EVALUATION COMPLEXITY: Low   GOALS: Goals reviewed with patient? No  SHORT TERM GOALS: Target date: 10/15/2022 Pt will demonstrate appropriate understanding and performance of initially prescribed HEP in order to facilitate improved independence with management of symptoms.  Baseline: HEP provided on eval Goal status: INITIAL   2. Pt will score greater than or equal to 65 on FOTO in order to demonstrate improved perception of function due to symptoms.  Baseline: 63  Goal status: INITIAL    LONG TERM GOALS: Target date: 11/26/2022  Pt will score 68 or greater on FOTO in order to demonstrate improved perception of function due to symptoms.  Baseline: 63 Goal status: INITIAL  2.  Pt will demonstrate at least 0-120 degrees of left knee AROM in order to facilitate improved tolerance to functional movements such as walking, stair navigation, and transfers.  Baseline: see ROM chart above Goal status: INITIAL  3.  Pt will demonstrate  grossly symmetrical knee extension strength via MMT or handheld dynamometer in order to facilitate improved functional kinematics and maximize outcomes. Baseline: deferred given surgical acuity Goal status: INITIAL  4.  Pt will be able to perform 5 consecutive sit to stands without UE support and grossly WNL mechanics to improve safety w/ transfers.  Baseline: UE support for transfers w/ reduced WB through surgical limb Goal status: INITIAL   5. Pt will report ability to stand/walk for up to 1hr in order to maximize tolerance to community navigation and household tasks.  Baseline:  altered mechanics, increased swelling with standing/walking  Goal status: INITIAL   6. Pt will be able to safely navigate up to 5 stairs without UE support in order to facilitate improved independence with home access.   Baseline: altered mechanics, rail use  Goal status: INITIAL    PLAN:  PT FREQUENCY: 2x/week  PT DURATION: 12 weeks  PLANNED INTERVENTIONS: Therapeutic exercises, Therapeutic activity, Neuromuscular re-education, Balance training, Gait training, Patient/Family education, Self Care, Joint mobilization, Stair training, Aquatic Therapy, Dry Needling, Electrical stimulation, Cryotherapy, Moist heat, scar mobilization, Taping, Manual therapy, and Re-evaluation  PLAN FOR NEXT SESSION: Review/update HEP PRN. Work on quad activation, ROM; refer to [employer] protocol (see media tab). Gait training, gross hip/ ankle strengthening.   Mauri Reading PT, DPT 09/06/22  4:24 PM

## 2022-09-11 ENCOUNTER — Ambulatory Visit: Payer: Medicaid Other | Attending: Physician Assistant

## 2022-09-11 DIAGNOSIS — R600 Localized edema: Secondary | ICD-10-CM | POA: Insufficient documentation

## 2022-09-11 DIAGNOSIS — M25562 Pain in left knee: Secondary | ICD-10-CM | POA: Diagnosis present

## 2022-09-11 DIAGNOSIS — M25662 Stiffness of left knee, not elsewhere classified: Secondary | ICD-10-CM | POA: Diagnosis present

## 2022-09-11 DIAGNOSIS — R2689 Other abnormalities of gait and mobility: Secondary | ICD-10-CM | POA: Insufficient documentation

## 2022-09-11 NOTE — Therapy (Signed)
OUTPATIENT PHYSICAL THERAPY TREATMENT NOTE   Patient Name: Kathy White SCHIPPERS MRN: [id number] DOB:1980/04/14, 42 y.o., female Today's Date: 09/11/2022  END OF SESSION:  PT End of Session - 09/11/22 1451     Visit Number 4    Number of Visits 25    Date for PT Re-Evaluation 10/29/22    Authorization Type [place] [employer]    Authorization - Visit Number 3    Authorization - Number of Visits 12    PT Start Time 1450    PT Stop Time 1530    PT Time Calculation (min) 40 min    Activity Tolerance Patient tolerated treatment well;No increased pain    Behavior During Therapy [facility] for tasks assessed/performed               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   Hematuria 04/21/2013   History of abnormal cervical Pap smear    History of HPV infection    Hypertension    IBS (irritable bowel syndrome)    Left ACL tear    Migraine    OA (osteoarthritis) of knee    Obesity    Pain of joint of left ankle and foot 10/08/2019   PCO (polycystic ovaries)    Pelvic pain 09/16/2018   S/P ACL reconstruction 03/22/2015   Stiffness of left knee 09/25/2021   Vaginal Pap smear, abnormal    Vitamin D deficiency    Past Surgical History:  Procedure Laterality Date   BREAST BIOPSY  08/07/2022   MM RT RADIOACTIVE SEED LOC MAMMO GUIDE 08/07/2022 [facility] MAMMOGRAPHY   BREAST LUMPECTOMY WITH RADIOACTIVE SEED LOCALIZATION Right 08/08/2022   Procedure: RIGHT BREAST LUMPECTOMY WITH RADIOACTIVE SEED LOCALIZATION;  Surgeon: Griselda Miner, MD;  Location:  [facility];  Service: General;  Laterality: Right;   CARPAL TUNNEL RELEASE Right 04/28/2021   CERVICAL BIOPSY  W/ LOOP ELECTRODE EXCISION     CESAREAN SECTION  11/14/2004   EXAM UNDER ANESTHESIA WITH MANIPULATION OF KNEE Left 03/22/2015   Procedure: LEFT KNEE EXAM UNDER ANESTHESIA  ;  Surgeon: Eugenia Mcalpine, MD;  Location: [facility] [place];  Service: Orthopedics;  Laterality: Left;   HAND WOUND EXPLORATION AND TENDON REPAIR'S Right 10/12/2014   right ring and index fingers   KNEE ARTHROSCOPY W/ PARTIAL MEDIAL MENISCECTOMY Left 09/23/2021   KNEE ARTHROSCOPY WITH ANTERIOR CRUCIATE LIGAMENT (ACL) REPAIR WITH HAMSTRING GRAFT Left 03/22/2015   Procedure: LEFT KNEE ARTHROSCOPY WITH ANTERIOR CRUCIATE LIGAMENT (ACL) AUTOGRAFT RECONSTRUCTION;  Surgeon: Eugenia Mcalpine, MD;  Location: [facility] [place];  Service: Orthopedics;  Laterality: Left;  ANESTHESIA: GENERAL/ABDUCTOR CANAL BLOCK   KNEE SURGERY  09/2021   LAPAROSCOPIC CHOLECYSTECTOMY  12/20/2004   LAPAROSCOPIC GASTRIC BAND REMOVAL WITH LAPAROSCOPIC GASTRIC SLEEVE RESECTION  09/09/2017   MENISECTOMY Left 03/22/2015   Procedure: PARTIAL LEFT KNEE  LATERAL MENISECTOMY, CHONDROPLASTY;  Surgeon: Eugenia Mcalpine, MD;  Location: [facility];  Service: Orthopedics;  Laterality: Left;   NERVE, TENDON AND ARTERY REPAIR  Right 10/12/2014   Procedure: RIGHT RING FINGER AND RIGHT SMALL FINGER WOUND EXPLORATION WITH TENDON REPAIRS;  Surgeon: Bradly Bienenstock, MD;  Location: [facility];  Service: Orthopedics;  Laterality: Right;   SYNOVECTOMY Left 08/23/2022   Procedure: SYNOVECTOMY;  Surgeon: Bjorn Pippin, MD;  Location: [facility];  Service: Orthopedics;  Laterality: Left;   WISDOM TOOTH EXTRACTION     Patient Active Problem List   Diagnosis Date Noted   Genetic testing 08/14/2022   Family history of uterine cancer 08/02/2022   Family history of BRCA1 gene positive 08/02/2022   Dysuria 06/11/2022    Burning with urination 05/22/2022   Urinary frequency 05/22/2022   Gastro-esophageal reflux disease without esophagitis 03/08/2022   Neuropathy 03/08/2022   Generalized abdominal pain 03/07/2022   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   Right facial numbness 01/11/2021   Carpal tunnel syndrome of right wrist 01/05/2021   Numbness of hand 01/05/2021   Mass of upper outer quadrant of right breast 07/21/2020   Pain of joint of left ankle and foot 10/08/2019   Arthritis of both knees 10/02/2019   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   Prediabetes 05/30/2017   Vitamin D deficiency 05/30/2017   PCOS (polycystic ovarian syndrome) 01/11/2016   Fibroid 09/15/2015   S/P ACL reconstruction 03/22/2015   Hypertension 03/31/2013   IUD (intrauterine device) in place 02/26/2013   FH: breast cancer in first degree relative 02/26/2013   Leg edema 11/21/2012   Migraine 07/31/2012    PCP: Dois Davenport, MD  REFERRING PROVIDER: Vernetta Honey, PA-C   REFERRING DIAG: Left knee revision ACL reconstruction with allograft 08/23/22   THERAPY DIAG:  Left knee pain, unspecified chronicity  Localized edema  Stiffness of left knee, not elsewhere classified  Other abnormalities of gait and mobility  Rationale for Evaluation and Treatment: Rehabilitation  ONSET DATE: 08/23/22 op date  SUBJECTIVE:   SUBJECTIVE STATEMENT: Patient reports to PT with 1/10 pain.   PERTINENT HISTORY: Migraines, prior L ACL repair   PAIN:  Are you having pain: no pain Location/description: L knee Best-worst over past week: denies any pain since POD1  - aggravating factors: being on feet for a while, heat - Easing factors: icing, medication    PRECAUTIONS: s/p R ACLR [facility]ness Protocol, refer to media tab for details) Phase 1 0-6weeks post op: WBAT, goal is to be without assist by POD10 Knee immobilizer for 1 week with sleep, may be weaned during ambulation once able to do SLR  without lag ROM as tolerated, goals of full extension by week 2, 120 deg flexion by week 6    WEIGHT BEARING RESTRICTIONS: No  FALLS:  Has patient fallen in last 6 months? 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 Mild bruising about lateral incision. No erythema or  excessive warmth  PALPATION: No TTP quad or surgical site, patellar mobility is confounded by edema but appears grossly WNL, no pain  LOWER EXTREMITY ROM:     Active  Right eval Left eval Left 09/04/2022  Hip flexion     Hip extension     Hip internal rotation     Hip external rotation     Knee extension 0 deg Lacking 5 deg 3  Knee flexion 130 deg 80 deg (Able to achieve 90 deg post HEP) 118  (Blank rows = not tested) (Key: WFL = within functional limits not formally assessed, * = concordant pain, s = stiffness/stretching sensation, NT = not tested)  Comments: above is painless  LOWER EXTREMITY MMT:    MMT Right eval Left eval  Hip flexion    Hip abduction (modified sitting)    Hip internal rotation    Hip external rotation    Knee flexion    Knee extension    Ankle dorsiflexion     (Blank rows = not tested) (Key: WFL = within functional limits not formally assessed, * = concordant pain, s = stiffness/stretching sensation, NT  = not tested)  Comments: deferred on eval given recency of surgery  LOWER EXTREMITY SPECIAL TESTS:  Deferred given recency of surgery  FUNCTIONAL TESTS:  Sit to stand transfer: weight shift towards R, gentle UE support, mild anterior placement of LLE    GAIT: Distance walked: within clinic Assistive device utilized: None Level of assistance: Complete Independence Comments: antalgic gait LLE, reduced stance time and mildly increased trunk sway    TODAY'S TREATMENT:    [facility] Adult PT Treatment:                                                DATE: 09/11/2022  Therapeutic Exercise: Recumbent bike L4 x 5 min Prone quad stretch 3 x 30 sec with strap Supine hamstring stretch 3 x 30 sec Sidelying hip abduction 1 x 15 circles both CW/ CCW Mini wall squats with ball 0-45 degrees 1 x 16  Supine glute bridge, 2 x10 Seated ankle 4-way with blue theraband x 15 each    [facility] Adult PT Treatment:                                                DATE: 09/06/2022  Therapeutic Exercise: Recumbent bike L1 x 8 min (backward x 4 min and forward x 4 min)  Prone quad stretch 3 x 30 sec with strap Supine hamstring stretch 3 x 30 sec Quad set with towel in popliteal space for quad activation 5 x 45 sec hold Sidelying hip abduction 1 x 15, 1 x 10 circles both CW/ CCW Mini wall squats 0-45 degrees 1 x 16  Supine glute bridge x 15  [facility] Adult PT Treatment:                                                DATE: 09/04/2022 Therapeutic Exercise: Recumbent bike L1 x 6 min backward x 2 min and forward 4 min  Prone quad stretch 2 x 30 sec with strap Supine hamstring stretch 1 x 30 sec Quad set with towel in popliteal space for quad activation 5 x 45 sec hold Sidelying hip abduction 1 x 15, 1 x 10 circles both CW/ CCW Mini wall squats 0-45 degrees 1 x 15   Updated HEP today for mini wall  squats, sidelying hip abduciton prone quad stretch, hamstring stretch (supine and seated)    PATIENT EDUCATION:  Education details: Pt education on PT impairments, prognosis, and POC. Informed consent. Rationale for interventions, safe/appropriate HEP performance, safety w/ activity, icing  Person educated: Patient Education method: Explanation, Demonstration, Tactile cues, Verbal cues, and Handouts Education comprehension: verbalized understanding, returned demonstration, verbal cues required, tactile cues required, and needs further education    HOME EXERCISE PROGRAM: Access Code: [email or web address] URL: [email or web address] Date: 09/04/2022 Prepared by: Lulu Riding  Exercises - Active Straight Leg Raise with Quad Set  - 2-3 x daily - 7 x weekly - 1 sets - 5 reps - Long Sitting Quad Set with Towel Roll Under Heel  - 2-3 x daily - 7 x weekly - 1 sets - 10 reps - Supine Hamstring Stretch with Strap  - 1 x daily - 7 x weekly - 2 sets - 2 reps - 30 hold - Seated Hamstring Stretch  - 1 x daily - 7 x weekly - 2 sets - 2 reps - 30 hold - Wall Quarter Squat with Swiss Ball  - 1 x daily - 7 x weekly - 2 sets - 10 reps - Sidelying Hip Abduction  - 1 x daily - 7 x weekly - 3 sets - 10 reps  ASSESSMENT:  CLINICAL IMPRESSION: Durenda continues to do very well with her exercises, including progression of activities. She will continue to benefit on quad strengthening and BIL hip/ankle strengthening activities within surgeon's protocol.      OBJECTIVE IMPAIRMENTS: Abnormal gait, decreased activity tolerance, decreased endurance, decreased mobility, difficulty walking, decreased ROM, decreased strength, increased edema, impaired sensation, improper body mechanics, postural dysfunction, and pain.   ACTIVITY LIMITATIONS: carrying, lifting, bending, standing, squatting, stairs, transfers, and locomotion level  PARTICIPATION LIMITATIONS: meal prep, cleaning, laundry, community activity, and  occupation  PERSONAL FACTORS: Past/current experiences and Time since onset of injury/illness/exacerbation are also affecting patient's functional outcome.   REHAB POTENTIAL: Good  CLINICAL DECISION MAKING: Evolving/moderate complexity  EVALUATION COMPLEXITY: Low   GOALS: Goals reviewed with patient? No  SHORT TERM GOALS: Target date: 10/15/2022 Pt will demonstrate appropriate understanding and performance of initially prescribed HEP in order to facilitate improved independence with management of symptoms.  Baseline: HEP provided on eval Goal status: INITIAL   2. Pt will score greater than or equal to 65 on FOTO in order to demonstrate improved perception of function due to symptoms.  Baseline: 63  Goal status: INITIAL    LONG TERM GOALS: Target date: 11/26/2022  Pt will score 68 or greater on FOTO in order to demonstrate improved perception of function due to symptoms.  Baseline: 63 Goal status: INITIAL  2.  Pt will demonstrate at least  0-120 degrees of left knee AROM in order to facilitate improved tolerance to functional movements such as walking, stair navigation, and transfers.  Baseline: see ROM chart above Goal status: INITIAL  3.  Pt will demonstrate grossly symmetrical knee extension strength via MMT or handheld dynamometer in order to facilitate improved functional kinematics and maximize outcomes. Baseline: deferred given surgical acuity Goal status: INITIAL  4.  Pt will be able to perform 5 consecutive sit to stands without UE support and grossly WNL mechanics to improve safety w/ transfers.  Baseline: UE support for transfers w/ reduced WB through surgical limb Goal status: INITIAL   5. Pt will report ability to stand/walk for up to 1hr in order to maximize tolerance to community navigation and household tasks.  Baseline:  altered mechanics, increased swelling with standing/walking  Goal status: INITIAL   6. Pt will be able to safely navigate up to 5 stairs  without UE support in order to facilitate improved independence with home access.   Baseline: altered mechanics, rail use  Goal status: INITIAL    PLAN:  PT FREQUENCY: 2x/week  PT DURATION: 12 weeks  PLANNED INTERVENTIONS: Therapeutic exercises, Therapeutic activity, Neuromuscular re-education, Balance training, Gait training, Patient/Family education, Self Care, Joint mobilization, Stair training, Aquatic Therapy, Dry Needling, Electrical stimulation, Cryotherapy, Moist heat, scar mobilization, Taping, Manual therapy, and Re-evaluation  PLAN FOR NEXT SESSION: Review/update HEP PRN. Work on quad activation, ROM; refer to [employer] protocol (see media tab). Gait training, gross hip/ ankle strengthening.   Mauri Reading PT, DPT 09/11/22  4:05 PM

## 2022-09-20 ENCOUNTER — Ambulatory Visit: Payer: Medicaid Other | Admitting: Physical Therapy

## 2022-09-20 ENCOUNTER — Encounter: Payer: Self-pay | Admitting: Physical Therapy

## 2022-09-20 DIAGNOSIS — M25662 Stiffness of left knee, not elsewhere classified: Secondary | ICD-10-CM

## 2022-09-20 DIAGNOSIS — R2689 Other abnormalities of gait and mobility: Secondary | ICD-10-CM

## 2022-09-20 DIAGNOSIS — M25562 Pain in left knee: Secondary | ICD-10-CM | POA: Diagnosis not present

## 2022-09-20 DIAGNOSIS — R600 Localized edema: Secondary | ICD-10-CM

## 2022-09-20 NOTE — Therapy (Signed)
OUTPATIENT PHYSICAL THERAPY TREATMENT NOTE   Patient Name: Kathy White MRN: [id number] DOB:02/05/1981, 42 y.o., female Today's Date: 09/20/2022  END OF SESSION:  PT End of Session - 09/20/22 1017     Visit Number 5    Number of Visits 25    Date for PT Re-Evaluation 10/29/22    Authorization Type [place] medicaid healthy blue    Authorization Time Period 09/04/2022 -12/02/2022    Authorization - Visit Number 4    Authorization - Number of Visits 12    PT Start Time 1018    PT Stop Time 1101    PT Time Calculation (min) 43 min    Activity Tolerance Patient tolerated treatment well;No increased pain    Behavior During Therapy [facility] for tasks assessed/performed                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   Hematuria 04/21/2013   History of abnormal cervical Pap smear    History of HPV infection    Hypertension    IBS (irritable bowel syndrome)    Left ACL tear    Migraine    OA (osteoarthritis) of knee    Obesity    Pain of joint of left ankle and foot 10/08/2019   PCO (polycystic ovaries)    Pelvic pain 09/16/2018   S/P ACL reconstruction 03/22/2015   Stiffness of left knee 09/25/2021   Vaginal Pap smear, abnormal    Vitamin D deficiency    Past Surgical History:  Procedure Laterality Date   BREAST BIOPSY  08/07/2022   MM RT RADIOACTIVE SEED LOC MAMMO GUIDE 08/07/2022 [facility] MAMMOGRAPHY   BREAST LUMPECTOMY WITH RADIOACTIVE SEED LOCALIZATION Right 08/08/2022   Procedure: RIGHT BREAST LUMPECTOMY WITH RADIOACTIVE SEED  LOCALIZATION;  Surgeon: Griselda Miner, MD;  Location: [facility];  Service: General;  Laterality: Right;   CARPAL TUNNEL RELEASE Right 04/28/2021   CERVICAL BIOPSY  W/ LOOP ELECTRODE EXCISION     CESAREAN SECTION  11/14/2004   EXAM UNDER ANESTHESIA WITH MANIPULATION OF KNEE Left 03/22/2015   Procedure: LEFT KNEE EXAM UNDER ANESTHESIA  ;  Surgeon: Eugenia Mcalpine, MD;  Location: [facility];  Service: Orthopedics;  Laterality: Left;   HAND WOUND EXPLORATION AND TENDON REPAIR'S Right 10/12/2014   right ring and index fingers   KNEE ARTHROSCOPY W/ PARTIAL MEDIAL MENISCECTOMY Left 09/23/2021   KNEE ARTHROSCOPY WITH ANTERIOR CRUCIATE LIGAMENT (ACL) REPAIR WITH HAMSTRING GRAFT Left 03/22/2015   Procedure: LEFT KNEE ARTHROSCOPY WITH ANTERIOR CRUCIATE LIGAMENT (ACL) AUTOGRAFT RECONSTRUCTION;  Surgeon: Eugenia Mcalpine, MD;  Location: [facility];  Service: Orthopedics;  Laterality: Left;  ANESTHESIA: GENERAL/ABDUCTOR CANAL BLOCK   KNEE SURGERY  09/2021   LAPAROSCOPIC CHOLECYSTECTOMY  12/20/2004   LAPAROSCOPIC GASTRIC BAND REMOVAL WITH LAPAROSCOPIC GASTRIC SLEEVE RESECTION  09/09/2017   MENISECTOMY Left 03/22/2015   Procedure: PARTIAL LEFT KNEE  LATERAL MENISECTOMY, CHONDROPLASTY;  Surgeon: Eugenia Mcalpine, MD;  Location: [facility];  Service: Orthopedics;  Laterality: Left;   NERVE, TENDON AND ARTERY REPAIR Right 10/12/2014   Procedure: RIGHT RING FINGER AND RIGHT SMALL FINGER WOUND EXPLORATION WITH TENDON REPAIRS;  Surgeon: Bradly Bienenstock, MD;  Location: [facility];  Service: Orthopedics;  Laterality: Right;   SYNOVECTOMY Left 08/23/2022   Procedure: SYNOVECTOMY;  Surgeon: Bjorn Pippin, MD;  Location: [facility];  Service: Orthopedics;  Laterality: Left;   WISDOM TOOTH EXTRACTION     Patient Active Problem List   Diagnosis Date Noted   Genetic testing 08/14/2022   Family history of uterine cancer 08/02/2022   Family history of BRCA1  gene positive 08/02/2022   Dysuria 06/11/2022   Burning with urination 05/22/2022   Urinary frequency 05/22/2022   Gastro-esophageal reflux disease without esophagitis 03/08/2022   Neuropathy 03/08/2022   Generalized abdominal pain 03/07/2022   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   Right facial numbness 01/11/2021   Carpal tunnel syndrome of right wrist 01/05/2021   Numbness of hand 01/05/2021   Mass of upper outer quadrant of right breast 07/21/2020   Pain of joint of left ankle and foot 10/08/2019   Arthritis of both knees 10/02/2019   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   Prediabetes 05/30/2017   Vitamin D deficiency 05/30/2017   PCOS (polycystic ovarian syndrome) 01/11/2016   Fibroid 09/15/2015   S/P ACL reconstruction 03/22/2015   Hypertension 03/31/2013   IUD (intrauterine device) in place 02/26/2013   FH: breast cancer in first degree relative 02/26/2013   Leg edema 11/21/2012   Migraine 07/31/2012    PCP: Dois Davenport, MD  REFERRING PROVIDER: Vernetta Honey, PA-C   REFERRING DIAG: Left knee revision ACL reconstruction with allograft 08/23/22   THERAPY DIAG:  Left knee pain, unspecified chronicity  Localized edema  Stiffness of left knee, not elsewhere classified  Other abnormalities of gait and mobility  Rationale for Evaluation and Treatment: Rehabilitation  ONSET DATE: 08/23/22 op date  SUBJECTIVE:   SUBJECTIVE STATEMENT: " I had some soreness in the outside of my knee."   PERTINENT HISTORY: Migraines, prior L ACL repair   PAIN:  Are you having pain: no pain Location/description: L knee Best-worst over past week: denies any pain since POD1  - aggravating factors: being on feet for a while, heat - Easing factors: icing, medication    PRECAUTIONS: s/p R ACLR [facility] Protocol, refer to media tab for details) Phase 1 0-6weeks post op: WBAT, goal is to be without assist by POD10 Knee immobilizer for 1 week with  sleep, may be weaned during ambulation once able to do SLR without lag ROM as tolerated, goals of full extension by week 2, 120 deg flexion by week 6    WEIGHT BEARING RESTRICTIONS: No  FALLS:  Has patient fallen in last 6 months? 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 Mild bruising about lateral incision. No erythema or excessive warmth  PALPATION: No TTP quad or surgical site, patellar mobility is confounded by edema but appears grossly WNL, no pain  LOWER EXTREMITY ROM:     Active  Right eval Left eval Left 09/04/2022 Left 09/20/2022  Hip flexion      Hip extension      Hip internal rotation      Hip external rotation      Knee extension 0 deg Lacking 5 deg 3 1  Knee flexion 130 deg 80 deg (Able to achieve 90 deg post HEP) 118 122  (Blank rows = not tested) (Key: WFL = within functional limits not formally assessed, * = concordant pain, s = stiffness/stretching sensation, NT = not tested)  Comments: above is painless  LOWER EXTREMITY MMT:    MMT Right eval Left eval  Hip flexion    Hip abduction (modified sitting)    Hip internal rotation    Hip external rotation    Knee flexion    Knee extension    Ankle dorsiflexion     (Blank rows = not tested) (Key: WFL = within functional  limits not formally assessed, * = concordant pain, s = stiffness/stretching sensation, NT = not tested)  Comments: deferred on eval given recency of surgery  LOWER EXTREMITY SPECIAL TESTS:  Deferred given recency of surgery  FUNCTIONAL TESTS:  Sit to stand transfer: weight shift towards R, gentle UE support, mild anterior placement of LLE    GAIT: Distance walked: within clinic Assistive device utilized: None Level of assistance: Complete Independence Comments: antalgic gait LLE, reduced stance time and mildly increased trunk sway    TODAY'S TREATMENT:   [facility] Adult PT Treatment:                                                DATE: 09/20/2022 Therapeutic Exercise: Prone quad stretch 1 x 30 sec, 1 x 30 sec following bike Recumbent bike x 7 min L 3  quadruped hip mule kicks 2 x 15 L sidelying R hip abduction oscillations at mid range 2 x 1 min Bridge with ball squeeze 1 x 20  Manual Therapy: MTPR along the vastus lateralis x 2  DTM with tiger tail    [facility] Adult PT Treatment:                                                DATE: 09/11/2022 Therapeutic Exercise: Recumbent bike L4 x 5 min Prone quad stretch 3 x 30 sec with strap Supine hamstring stretch 3 x 30 sec Sidelying hip abduction 1 x 15 circles both CW/ CCW Mini wall squats with ball 0-45 degrees 1 x 16  Supine glute bridge, 2 x10 Seated ankle 4-way with blue theraband x 15 each    [facility] Adult PT Treatment:                                                DATE: 09/06/2022  Therapeutic Exercise: Recumbent bike L1 x 8 min (backward x 4 min and  forward x 4 min)  Prone quad stretch 3 x 30 sec with strap Supine hamstring stretch 3 x 30 sec Quad set with towel in popliteal space for quad activation 5 x 45 sec hold Sidelying hip abduction 1 x 15, 1 x 10 circles both CW/ CCW Mini wall squats 0-45 degrees 1 x 16  Supine glute bridge x 15                                                                                                                            PATIENT EDUCATION: Education details: Pt education on PT impairments, prognosis, and POC. Informed consent. Rationale for interventions, safe/appropriate HEP performance, safety w/ activity, icing  Person educated: Patient Education method: Explanation, Demonstration, Tactile cues, Verbal cues, and Handouts Education comprehension: verbalized understanding, returned demonstration, verbal cues required, tactile cues required, and needs further education    HOME EXERCISE PROGRAM: Access Code: [email or web address] URL: [email or web address] Date: 09/04/2022 Prepared by: Lulu Riding  Exercises - Active Straight Leg Raise with Quad Set  - 2-3 x daily - 7 x weekly - 1 sets - 5 reps - Long Sitting Quad Set with Towel Roll Under Heel  - 2-3 x daily - 7 x weekly - 1 sets - 10 reps - Supine Hamstring Stretch with Strap  - 1 x daily - 7 x weekly - 2 sets - 2 reps - 30 hold - Seated Hamstring Stretch  - 1 x daily - 7 x weekly - 2 sets - 2 reps - 30 hold - Wall Quarter Squat with Swiss Ball  - 1 x daily - 7 x weekly - 2 sets - 10 reps - Sidelying Hip Abduction  - 1 x daily - 7 x weekly - 3 sets - 10 reps  ASSESSMENT:  CLINICAL IMPRESSION: Kalimah is 4 weeks post op today and continues to do very well with physical therapy with improving her ROM to within functional ranges. Continued focus on gross hip/ knee strengthening staying within the protocol of the referring provider. She does fatigue quickly especially with hip abduction strengthening. Some soreness noted along the lateral pole of the patella and lateral femoral condyle that greatly improved with manual trigger point release along the vastus lateralis followed with stretching. End of session she noted no pain or soreness. She sees her surgeon later today for her follow up.   OBJECTIVE IMPAIRMENTS: Abnormal gait, decreased activity tolerance, decreased endurance, decreased mobility, difficulty walking, decreased  ROM, decreased strength, increased edema, impaired sensation, improper body mechanics, postural dysfunction, and pain.   ACTIVITY LIMITATIONS: carrying, lifting, bending, standing, squatting, stairs, transfers, and locomotion level  PARTICIPATION LIMITATIONS: meal prep, cleaning, laundry, community activity, and occupation  PERSONAL FACTORS: Past/current experiences and Time since onset of injury/illness/exacerbation are also affecting patient's functional outcome.   REHAB POTENTIAL: Good  CLINICAL DECISION MAKING: Evolving/moderate complexity  EVALUATION COMPLEXITY: Low   GOALS: Goals reviewed with patient? No  SHORT TERM GOALS: Target date: 10/15/2022 Pt will  demonstrate appropriate understanding and performance of initially prescribed HEP in order to facilitate improved independence with management of symptoms.  Baseline: HEP provided on eval Goal status: INITIAL   2. Pt will score greater than or equal to 65 on FOTO in order to demonstrate improved perception of function due to symptoms.  Baseline: 63  Goal status: INITIAL    LONG TERM GOALS: Target date: 11/26/2022  Pt will score 68 or greater on FOTO in order to demonstrate improved perception of function due to symptoms.  Baseline: 63 Goal status: INITIAL  2.  Pt will demonstrate at least 0-120 degrees of left knee AROM in order to facilitate improved tolerance to functional movements such as walking, stair navigation, and transfers.  Baseline: see ROM chart above Goal status: INITIAL  3.  Pt will demonstrate grossly symmetrical knee extension strength via MMT or handheld dynamometer in order to facilitate improved functional kinematics and maximize outcomes. Baseline: deferred given surgical acuity Goal status: INITIAL  4.  Pt will be able to perform 5 consecutive sit to stands without UE support and grossly WNL mechanics to improve safety w/ transfers.  Baseline: UE support for transfers w/ reduced WB through surgical  limb Goal status: INITIAL   5. Pt will report ability to stand/walk for up to 1hr in order to maximize tolerance to community navigation and household tasks.  Baseline:  altered mechanics, increased swelling with standing/walking  Goal status: INITIAL   6. Pt will be able to safely navigate up to 5 stairs without UE support in order to facilitate improved independence with home access.   Baseline: altered mechanics, rail use  Goal status: INITIAL   PLAN:  PT FREQUENCY: 2x/week  PT DURATION: 12 weeks  PLANNED INTERVENTIONS: Therapeutic exercises, Therapeutic activity, Neuromuscular re-education, Balance training, Gait training, Patient/Family education, Self Care, Joint mobilization, Stair training, Aquatic Therapy, Dry Needling, Electrical stimulation, Cryotherapy, Moist heat, scar mobilization, Taping, Manual therapy, and Re-evaluation  PLAN FOR NEXT SESSION: Review/update HEP PRN. Work on quad activation, ROM; refer to [employer] protocol (see media tab). Gait training, gross hip/ ankle strengthening. How was MD's visit.   Nettie Wyffels PT, DPT, LAT, ATC  09/20/22  11:10 AM

## 2022-09-26 ENCOUNTER — Ambulatory Visit: Payer: Medicaid Other

## 2022-09-26 DIAGNOSIS — M25662 Stiffness of left knee, not elsewhere classified: Secondary | ICD-10-CM

## 2022-09-26 DIAGNOSIS — R2689 Other abnormalities of gait and mobility: Secondary | ICD-10-CM

## 2022-09-26 DIAGNOSIS — M25562 Pain in left knee: Secondary | ICD-10-CM

## 2022-09-26 DIAGNOSIS — R600 Localized edema: Secondary | ICD-10-CM

## 2022-09-26 NOTE — Therapy (Signed)
OUTPATIENT PHYSICAL THERAPY TREATMENT NOTE   Patient Name: Kathy White MRN: [id number] DOB:1980-05-13, 42 y.o., female Today's Date: 09/26/2022  END OF SESSION:  PT End of Session - 09/26/22 1528     Visit Number 6    Number of Visits 25    Date for PT Re-Evaluation 10/29/22    Authorization Type [place] [employer]    Authorization Time Period 09/04/2022 -12/02/2022    Authorization - Visit Number 5    Authorization - Number of Visits 12    PT Start Time 1530    PT Stop Time 1608    PT Time Calculation (min) 38 min    Activity Tolerance Patient tolerated treatment well;No increased pain    Behavior During Therapy [facility] for tasks assessed/performed                 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   Hematuria 04/21/2013   History of abnormal cervical Pap smear    History of HPV infection    Hypertension    IBS (irritable bowel syndrome)    Left ACL tear    Migraine    OA (osteoarthritis) of knee    Obesity    Pain of joint of left ankle and foot 10/08/2019   PCO (polycystic ovaries)    Pelvic pain 09/16/2018   S/P ACL reconstruction 03/22/2015   Stiffness of left knee 09/25/2021   Vaginal Pap smear, abnormal    Vitamin D deficiency    Past Surgical History:  Procedure Laterality Date   BREAST BIOPSY  08/07/2022   MM RT RADIOACTIVE SEED LOC MAMMO GUIDE 08/07/2022 [facility] MAMMOGRAPHY   BREAST LUMPECTOMY WITH RADIOACTIVE SEED LOCALIZATION Right 08/08/2022   Procedure: RIGHT BREAST LUMPECTOMY WITH RADIOACTIVE SEED  LOCALIZATION;  Surgeon: Griselda Miner, MD;  Location: [facility];  Service: General;  Laterality: Right;   CARPAL TUNNEL RELEASE Right 04/28/2021   CERVICAL BIOPSY  W/ LOOP ELECTRODE EXCISION     CESAREAN SECTION  11/14/2004   EXAM UNDER ANESTHESIA WITH MANIPULATION OF KNEE Left 03/22/2015   Procedure: LEFT KNEE EXAM UNDER ANESTHESIA  ;  Surgeon: Eugenia Mcalpine, MD;  Location: [facility] [place];  Service: Orthopedics;  Laterality: Left;   HAND WOUND EXPLORATION AND TENDON REPAIR'S Right 10/12/2014   right ring and index fingers   KNEE ARTHROSCOPY W/ PARTIAL MEDIAL MENISCECTOMY Left 09/23/2021   KNEE ARTHROSCOPY WITH ANTERIOR CRUCIATE LIGAMENT (ACL) REPAIR WITH HAMSTRING GRAFT Left 03/22/2015   Procedure: LEFT KNEE ARTHROSCOPY WITH ANTERIOR CRUCIATE LIGAMENT (ACL) AUTOGRAFT RECONSTRUCTION;  Surgeon: Eugenia Mcalpine, MD;  Location: [facility];  Service: Orthopedics;  Laterality: Left;  ANESTHESIA: GENERAL/ABDUCTOR CANAL BLOCK   KNEE SURGERY  09/2021   LAPAROSCOPIC CHOLECYSTECTOMY  12/20/2004   LAPAROSCOPIC GASTRIC BAND REMOVAL WITH LAPAROSCOPIC GASTRIC SLEEVE RESECTION  09/09/2017   MENISECTOMY Left 03/22/2015   Procedure: PARTIAL LEFT KNEE  LATERAL MENISECTOMY, CHONDROPLASTY;  Surgeon: Eugenia Mcalpine, MD;  Location: [facility] [place];  Service: Orthopedics;  Laterality: Left;   NERVE, TENDON AND ARTERY REPAIR Right 10/12/2014   Procedure: RIGHT RING FINGER AND RIGHT SMALL FINGER WOUND EXPLORATION WITH TENDON REPAIRS;  Surgeon: Bradly Bienenstock, MD;  Location: [facility];  Service: Orthopedics;  Laterality: Right;   SYNOVECTOMY Left 08/23/2022   Procedure: SYNOVECTOMY;  Surgeon: Bjorn Pippin, MD;  Location: [facility];  Service: Orthopedics;  Laterality: Left;   WISDOM TOOTH EXTRACTION     Patient Active Problem List   Diagnosis Date Noted   Genetic testing 08/14/2022   Family history of uterine cancer 08/02/2022   Family history of BRCA1  gene positive 08/02/2022   Dysuria 06/11/2022   Burning with urination 05/22/2022   Urinary frequency 05/22/2022   Gastro-esophageal reflux disease without esophagitis 03/08/2022   Neuropathy 03/08/2022   Generalized abdominal pain 03/07/2022   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   Right facial numbness 01/11/2021   Carpal tunnel syndrome of right wrist 01/05/2021   Numbness of hand 01/05/2021   Mass of upper outer quadrant of right breast 07/21/2020   Pain of joint of left ankle and foot 10/08/2019   Arthritis of both knees 10/02/2019   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   Prediabetes 05/30/2017   Vitamin D deficiency 05/30/2017   PCOS (polycystic ovarian syndrome) 01/11/2016   Fibroid 09/15/2015   S/P ACL reconstruction 03/22/2015   Hypertension 03/31/2013   IUD (intrauterine device) in place 02/26/2013   FH: breast cancer in first degree relative 02/26/2013   Leg edema 11/21/2012   Migraine 07/31/2012    PCP: Dois Davenport, MD  REFERRING PROVIDER: Vernetta Honey, PA-C   REFERRING DIAG: Left knee revision ACL reconstruction with allograft 08/23/22   THERAPY DIAG:  Left knee pain, unspecified chronicity  Localized edema  Stiffness of left knee, not elsewhere classified  Other abnormalities of gait and mobility  Rationale for Evaluation and Treatment: Rehabilitation  ONSET DATE: 08/23/22 op date  SUBJECTIVE:   SUBJECTIVE STATEMENT: Patient reports that she has no pain in her knee today, only some lingering swelling.    PERTINENT HISTORY: Migraines, prior L ACL repair   PAIN:  Are you having pain: no pain Location/description: L knee Best-worst over past week: denies any pain since POD1  - aggravating factors: being on feet for a while, heat - Easing factors: icing, medication    PRECAUTIONS: s/p R ACLR [facility] Protocol, refer to media tab for details) Phase 1 0-6weeks post op: WBAT, goal is to be without assist by  POD10 Knee immobilizer for 1 week with sleep, may be weaned during ambulation once able to do SLR without lag ROM as tolerated, goals of full extension by week 2, 120 deg flexion by week 6    WEIGHT BEARING RESTRICTIONS: No  FALLS:  Has patient fallen in last 6 months? 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 Mild bruising about lateral incision. No erythema or excessive warmth  PALPATION: No TTP quad or surgical site, patellar mobility is confounded by edema but appears grossly WNL, no pain  LOWER EXTREMITY ROM:     Active  Right eval Left eval Left 09/04/2022 Left 09/20/2022  Hip flexion      Hip extension      Hip internal rotation      Hip external rotation      Knee extension 0 deg Lacking 5 deg 3 1  Knee flexion 130 deg 80 deg (Able to achieve 90 deg post HEP) 118 122  (Blank rows = not tested) (Key: WFL = within functional limits not formally assessed, * = concordant pain, s = stiffness/stretching sensation, NT = not tested)  Comments: above is painless  LOWER EXTREMITY MMT:    MMT Right eval Left eval  Hip flexion    Hip abduction (modified sitting)    Hip internal rotation    Hip external rotation    Knee flexion    Knee extension    Ankle dorsiflexion     (Blank rows = not  tested) (Key: WFL = within functional limits not formally assessed, * = concordant pain, s = stiffness/stretching sensation, NT = not tested)  Comments: deferred on eval given recency of surgery  LOWER EXTREMITY SPECIAL TESTS:  Deferred given recency of surgery  FUNCTIONAL TESTS:  Sit to stand transfer: weight shift towards R, gentle UE support, mild anterior placement of LLE    GAIT: Distance walked: within clinic Assistive device utilized: None Level of assistance: Complete Independence Comments: antalgic gait LLE, reduced stance time and mildly increased trunk sway    TODAY'S TREATMENT:    [facility] Adult PT Treatment:                                                DATE: 09/26/2022  Therapeutic Exercise: Recumbent bike x 7 min L 3  quadruped hip mule kicks 2 x 15 R sidelying L hip abduction oscillations at mid range 2 x 1 min Bridge with ball squeeze,  x 20  Mini wall squats with ball 0-45 degrees, 3 x 10  Wall sit at end of each set, x20sec, x12sec, x10 sec  Seated clamshells with green TB above knee, x 30  Seated marches with green TB above knee, x 20 each LE Prone quad stretch 1 x 30 sec, 1 x 30 sec following bike Supine HS stretch with ban.    [facility] Adult PT Treatment:                                                DATE: 09/20/2022  Therapeutic Exercise: Prone quad stretch 1 x 30 sec, 1 x 30 sec following bike Recumbent bike x 7 min L 3  quadruped hip mule kicks 2 x 15 R sidelying L hip abduction oscillations at mid range 2 x 1 min Bridge with ball squeeze,  x 20   Manual Therapy: MTPR along the vastus lateralis x 2  DTM with tiger tail  [facility] Adult PT Treatment:  DATE: 09/11/2022 Therapeutic Exercise: Recumbent bike L4 x 5 min Prone quad stretch 3 x 30 sec with strap Supine hamstring stretch 3 x 30 sec Sidelying hip abduction 1 x 15 circles both CW/ CCW Mini wall squats with ball 0-45 degrees 1 x 16  Supine glute bridge,  2 x10 Seated ankle 4-way with blue theraband x 15 each                                                                                                                            PATIENT EDUCATION: Education details: Pt education on PT impairments, prognosis, and POC. Informed consent. Rationale for interventions, safe/appropriate HEP performance, safety w/ activity, icing  Person educated: Patient Education method: Explanation, Demonstration, Tactile cues, Verbal cues, and Handouts Education comprehension: verbalized understanding, returned demonstration, verbal cues required, tactile cues required, and needs further education    HOME EXERCISE PROGRAM: Access Code: [email or web address] URL: [email or web address] Date: 09/04/2022 Prepared by: Lulu Riding  Exercises - Active Straight Leg Raise with Quad Set  - 2-3 x daily - 7 x weekly - 1 sets - 5 reps - Long Sitting Quad Set with Towel Roll Under Heel  - 2-3 x daily - 7 x weekly - 1 sets - 10 reps - Supine Hamstring Stretch with Strap  - 1 x daily - 7 x weekly - 2 sets - 2 reps - 30 hold - Seated Hamstring Stretch  - 1 x daily - 7 x weekly - 2 sets - 2 reps - 30 hold - Wall Quarter Squat with Swiss Ball  - 1 x daily - 7 x weekly - 2 sets - 10 reps - Sidelying Hip Abduction  - 1 x daily - 7 x weekly - 3 sets - 10 reps  ASSESSMENT:  CLINICAL IMPRESSION: Patient is tolerated today's exercises very well without exacerbation of symptoms. She will benefit from progression of knee/hip strengthening within surgeon's protocol. Patient demonstrating less LE fatigue. We will continue with current treatment plan until 6 weeks post-op.    OBJECTIVE IMPAIRMENTS: Abnormal gait, decreased activity tolerance, decreased endurance, decreased mobility, difficulty walking, decreased ROM, decreased strength, increased edema, impaired sensation, improper body mechanics, postural dysfunction, and pain.   ACTIVITY LIMITATIONS: carrying, lifting,  bending, standing, squatting, stairs, transfers, and locomotion level  PARTICIPATION LIMITATIONS: meal prep, cleaning, laundry, community activity, and occupation  PERSONAL FACTORS: Past/current experiences and Time since onset of injury/illness/exacerbation are also affecting patient's functional outcome.   REHAB POTENTIAL: Good  CLINICAL DECISION MAKING: Evolving/moderate complexity  EVALUATION COMPLEXITY: Low   GOALS: Goals reviewed with patient? No  SHORT TERM GOALS: Target date: 10/15/2022 Pt will demonstrate appropriate understanding and performance of initially prescribed HEP in order to facilitate improved independence with management of symptoms.  Baseline: HEP provided on eval Goal status: INITIAL   2. Pt will score greater than or equal to 65 on FOTO in order to demonstrate improved perception of function due to symptoms.  Baseline: 63  Goal  status: INITIAL    LONG TERM GOALS: Target date: 11/26/2022  Pt will score 68 or greater on FOTO in order to demonstrate improved perception of function due to symptoms.  Baseline: 63 Goal status: INITIAL  2.  Pt will demonstrate at least 0-120 degrees of left knee AROM in order to facilitate improved tolerance to functional movements such as walking, stair navigation, and transfers.  Baseline: see ROM chart above Goal status: INITIAL  3.  Pt will demonstrate grossly symmetrical knee extension strength via MMT or handheld dynamometer in order to facilitate improved functional kinematics and maximize outcomes. Baseline: deferred given surgical acuity Goal status: INITIAL  4.  Pt will be able to perform 5 consecutive sit to stands without UE support and grossly WNL mechanics to improve safety w/ transfers.  Baseline: UE support for transfers w/ reduced WB through surgical limb Goal status: INITIAL   5. Pt will report ability to stand/walk for up to 1hr in order to maximize tolerance to community navigation and household  tasks.  Baseline:  altered mechanics, increased swelling with standing/walking  Goal status: INITIAL   6. Pt will be able to safely navigate up to 5 stairs without UE support in order to facilitate improved independence with home access.   Baseline: altered mechanics, rail use  Goal status: INITIAL   PLAN:  PT FREQUENCY: 2x/week  PT DURATION: 12 weeks  PLANNED INTERVENTIONS: Therapeutic exercises, Therapeutic activity, Neuromuscular re-education, Balance training, Gait training, Patient/Family education, Self Care, Joint mobilization, Stair training, Aquatic Therapy, Dry Needling, Electrical stimulation, Cryotherapy, Moist heat, scar mobilization, Taping, Manual therapy, and Re-evaluation  PLAN FOR NEXT SESSION: Review/update HEP PRN. Work on quad activation, ROM; refer to [employer] protocol (see media tab). Gait training, gross hip/ ankle strengthening. How was MD's visit.   Mauri Reading, PT, DPT 09/26/22  4:16 PM

## 2022-09-27 ENCOUNTER — Ambulatory Visit: Payer: Medicaid Other

## 2022-09-27 DIAGNOSIS — R2689 Other abnormalities of gait and mobility: Secondary | ICD-10-CM

## 2022-09-27 DIAGNOSIS — R600 Localized edema: Secondary | ICD-10-CM

## 2022-09-27 DIAGNOSIS — M25562 Pain in left knee: Secondary | ICD-10-CM

## 2022-09-27 DIAGNOSIS — M25662 Stiffness of left knee, not elsewhere classified: Secondary | ICD-10-CM

## 2022-09-27 NOTE — Therapy (Signed)
OUTPATIENT PHYSICAL THERAPY TREATMENT NOTE   Patient Name: Kathy White MRN: [id number] DOB:1980/11/07, 42 y.o., female Today's Date: 09/27/2022  END OF SESSION:  PT End of Session - 09/27/22 1535     Visit Number 7    Number of Visits 25    Date for PT Re-Evaluation 10/29/22    Authorization Type [place] [employer]    Authorization Time Period 09/04/2022 -12/02/2022    Authorization - Visit Number 6    Authorization - Number of Visits 12    PT Start Time 1532    PT Stop Time 1610    PT Time Calculation (min) 38 min    Activity Tolerance Patient tolerated treatment well;No increased pain    Behavior During Therapy [facility] for tasks assessed/performed                  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   Hematuria 04/21/2013   History of abnormal cervical Pap smear    History of HPV infection    Hypertension    IBS (irritable bowel syndrome)    Left ACL tear    Migraine    OA (osteoarthritis) of knee    Obesity    Pain of joint of left ankle and foot 10/08/2019   PCO (polycystic ovaries)    Pelvic pain 09/16/2018   S/P ACL reconstruction 03/22/2015   Stiffness of left knee 09/25/2021   Vaginal Pap smear, abnormal    Vitamin D deficiency    Past Surgical History:  Procedure Laterality Date   BREAST BIOPSY  08/07/2022   MM RT RADIOACTIVE SEED LOC MAMMO GUIDE 08/07/2022 [facility] MAMMOGRAPHY   BREAST LUMPECTOMY WITH RADIOACTIVE SEED LOCALIZATION Right 08/08/2022   Procedure: RIGHT BREAST LUMPECTOMY WITH RADIOACTIVE  SEED LOCALIZATION;  Surgeon: Griselda Miner, MD;  Location:  SURGERY CENTER;  Service: General;  Laterality: Right;   CARPAL TUNNEL RELEASE Right 04/28/2021   CERVICAL BIOPSY  W/ LOOP ELECTRODE EXCISION     CESAREAN SECTION  11/14/2004   EXAM UNDER ANESTHESIA WITH MANIPULATION OF KNEE Left 03/22/2015   Procedure: LEFT KNEE EXAM UNDER ANESTHESIA  ;  Surgeon: Eugenia Mcalpine, MD;  Location: [facility];  Service: Orthopedics;  Laterality: Left;   HAND WOUND EXPLORATION AND TENDON REPAIR'S Right 10/12/2014   right ring and index fingers   KNEE ARTHROSCOPY W/ PARTIAL MEDIAL MENISCECTOMY Left 09/23/2021   KNEE ARTHROSCOPY WITH ANTERIOR CRUCIATE LIGAMENT (ACL) REPAIR WITH HAMSTRING GRAFT Left 03/22/2015   Procedure: LEFT KNEE ARTHROSCOPY WITH ANTERIOR CRUCIATE LIGAMENT (ACL) AUTOGRAFT RECONSTRUCTION;  Surgeon: Eugenia Mcalpine, MD;  Location: [facility];  Service: Orthopedics;  Laterality: Left;  ANESTHESIA: GENERAL/ABDUCTOR CANAL BLOCK   KNEE SURGERY  09/2021   LAPAROSCOPIC CHOLECYSTECTOMY  12/20/2004   LAPAROSCOPIC GASTRIC BAND REMOVAL WITH LAPAROSCOPIC GASTRIC SLEEVE RESECTION  09/09/2017   MENISECTOMY Left 03/22/2015   Procedure: PARTIAL LEFT KNEE  LATERAL MENISECTOMY, CHONDROPLASTY;  Surgeon: Eugenia Mcalpine, MD;  Location: [facility];  Service:  Orthopedics;  Laterality: Left;   NERVE, TENDON AND ARTERY REPAIR Right 10/12/2014   Procedure: RIGHT RING FINGER AND RIGHT SMALL FINGER WOUND EXPLORATION WITH TENDON REPAIRS;  Surgeon: Bradly Bienenstock, MD;  Location: [facility];  Service: Orthopedics;  Laterality: Right;   SYNOVECTOMY Left 08/23/2022   Procedure: SYNOVECTOMY;  Surgeon: Bjorn Pippin, MD;  Location: [facility];  Service: Orthopedics;  Laterality: Left;   WISDOM TOOTH EXTRACTION     Patient Active Problem List   Diagnosis Date Noted   Genetic testing 08/14/2022   Family history of uterine cancer 08/02/2022   Family history of  BRCA1 gene positive 08/02/2022   Dysuria 06/11/2022   Burning with urination 05/22/2022   Urinary frequency 05/22/2022   Gastro-esophageal reflux disease without esophagitis 03/08/2022   Neuropathy 03/08/2022   Generalized abdominal pain 03/07/2022   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   Right facial numbness 01/11/2021   Carpal tunnel syndrome of right wrist 01/05/2021   Numbness of hand 01/05/2021   Mass of upper outer quadrant of right breast 07/21/2020   Pain of joint of left ankle and foot 10/08/2019   Arthritis of both knees 10/02/2019   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   Prediabetes 05/30/2017   Vitamin D deficiency 05/30/2017   PCOS (polycystic ovarian syndrome) 01/11/2016   Fibroid 09/15/2015   S/P ACL reconstruction 03/22/2015   Hypertension 03/31/2013   IUD (intrauterine device) in place 02/26/2013   FH: breast cancer in first degree relative 02/26/2013   Leg edema 11/21/2012   Migraine 07/31/2012    PCP: Dois Davenport, MD  REFERRING PROVIDER: Vernetta Honey, PA-C   REFERRING DIAG: Left knee revision ACL reconstruction with allograft 08/23/22   THERAPY DIAG:  Left knee pain, unspecified chronicity  Localized edema  Stiffness of left knee, not elsewhere classified  Other abnormalities of gait and mobility  Rationale for Evaluation and Treatment: Rehabilitation  ONSET DATE: 08/23/22 op date  SUBJECTIVE:   SUBJECTIVE STATEMENT: Patient reporting LE soreness following yesterday's session.    PERTINENT HISTORY: Migraines, prior L ACL repair   PAIN:  Are you having pain: no pain Location/description: L knee Best-worst over past week: denies any pain since POD1  - aggravating factors: being on feet for a while, heat - Easing factors: icing, medication    PRECAUTIONS: s/p R ACLR [facility] Protocol, refer to media tab for details)  Phase 1 0-6 weeks post op: WBAT, goal is to be without assist by POD10 Knee  immobilizer for 1 week with sleep, may be weaned during ambulation once able to do SLR without lag ROM as tolerated, goals of full extension by week 2, 120 deg flexion by week 6  Exercises: extension board and prone hang with ankle weights up to 10lbs. Stationary bike with no resistance for knee flexion (alter set height as ROM increases). Patellar mobilization 5-10 min daily. Quad sets, SLR with knee locked in extension. Begin closed chain work (0-45 deg) when FWB. No restrictions on ankle and hip strengthening.   Phase 2 6-12 weeks post op:  Full WB, no brace  No ROM limitation, increase as tolerated  Ice as much as possible, after therapy at minimum  Exercises: increase CC activities (0-90 deg), add pulley weights, therabands, etc; monitor for anterior knee pain symptoms. Add core strengthening exercises. Add side lunges and/or slideboard. Continue stationary bike and outdoor biking for ROM, strengthening and cardio. Swimming and water based controlled activities can begin. D/C to HEP if independent.  WEIGHT BEARING RESTRICTIONS: No  FALLS:  Has patient fallen in last 6 months? 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 Mild bruising about lateral incision. No erythema or excessive warmth  PALPATION: No TTP  quad or surgical site, patellar mobility is confounded by edema but appears grossly WNL, no pain  LOWER EXTREMITY ROM:     Active  Right eval Left eval Left 09/04/2022 Left 09/20/2022  Hip flexion      Hip extension      Hip internal rotation      Hip external rotation      Knee extension 0 deg Lacking 5 deg 3 1  Knee flexion 130 deg 80 deg (Able to achieve 90 deg post HEP) 118 122  (Blank rows = not tested) (Key: WFL = within functional limits not formally assessed, * = concordant pain, s = stiffness/stretching sensation, NT = not tested)  Comments: above is painless  LOWER EXTREMITY MMT:    MMT Right eval Left eval  Hip flexion    Hip abduction (modified sitting)    Hip internal rotation    Hip external rotation    Knee flexion    Knee extension    Ankle dorsiflexion     (Blank rows = not tested) (Key: WFL = within functional limits not formally assessed, * = concordant pain, s = stiffness/stretching sensation, NT = not tested)  Comments: deferred on eval given recency of surgery  LOWER EXTREMITY SPECIAL TESTS:  Deferred given recency of surgery  FUNCTIONAL TESTS:  Sit to stand transfer: weight shift towards R, gentle UE support, mild anterior placement of LLE    GAIT: Distance walked: within clinic Assistive device utilized: None Level of assistance: Complete Independence Comments: antalgic gait LLE, reduced stance time and mildly increased trunk sway    TODAY'S TREATMENT:    [facility] Adult PT Treatment:                                                DATE: 09/27/2022  Therapeutic Exercise: Recumbent bike x 5 min  Quadruped hip mule kicks 3 x 10  R sidelying L hip abduction oscillations at mid range x30 Bridge with ball squeeze, x 25 Glute bridge on pball for CKC hip strengthening, 2 x 10 Supine marches (90-90 variation), 2 x 10 each side  Mini wall squats with ball 0-45 degrees, 3 x 10  Wall sit at end of each set, x12sec hold  Seated clamshells with green  TB above knee, x 30 BIL Seated marches with green TB above knee, x 30 each LE Resisted Walking with cable column - fwd/lateral x 5 each    [facility] Adult PT Treatment:  DATE: 09/26/2022  Therapeutic Exercise: Recumbent bike x 7 min L 3  quadruped hip mule kicks 2 x 15 R sidelying L hip abduction oscillations at mid range 2 x 1 min Bridge with ball squeeze,  x 20  Mini wall squats with ball 0-45 degrees, 3 x 10  Wall sit at end of each set, x20sec, x12sec, x10 sec  Seated clamshells with green TB above knee, x 30  Seated marches with green TB above knee, x 20 each LE Prone quad stretch 1 x 30 sec, 1 x 30 sec following bike Supine HS stretch with ban.    [facility] Adult PT Treatment:                                                DATE: 09/20/2022  Therapeutic Exercise: Prone quad stretch 1 x 30 sec, 1 x 30 sec following bike Recumbent bike x 7 min L 3  quadruped hip mule kicks 2 x 15 R sidelying L hip abduction oscillations at mid range 2 x 1 min Bridge with ball squeeze,  x 20   Manual Therapy: MTPR along the vastus lateralis x 2  DTM with tiger tail                                                                                                                           PATIENT EDUCATION: Education details: Pt education on PT impairments, prognosis, and POC. Informed consent. Rationale for interventions, safe/appropriate HEP performance, safety w/ activity, icing  Person educated: Patient Education method: Explanation, Demonstration, Tactile cues, Verbal cues, and Handouts Education comprehension: verbalized understanding, returned demonstration, verbal cues required, tactile cues required, and needs further education    HOME EXERCISE PROGRAM: Access Code: [email or web address] URL: [email or web address] Date: 09/04/2022 Prepared by: Lulu Riding  Exercises - Active Straight Leg Raise with Quad Set  - 2-3 x daily - 7 x weekly - 1  sets - 5 reps - Long Sitting Quad Set with Towel Roll Under Heel  - 2-3 x daily - 7 x weekly - 1 sets - 10 reps - Supine Hamstring Stretch with Strap  - 1 x daily - 7 x weekly - 2 sets - 2 reps - 30 hold - Seated Hamstring Stretch  - 1 x daily - 7 x weekly - 2 sets - 2 reps - 30 hold - Wall Quarter Squat with Swiss Ball  - 1 x daily - 7 x weekly - 2 sets - 10 reps - Sidelying Hip Abduction  - 1 x daily - 7 x weekly - 3 sets - 10 reps  ASSESSMENT:  CLINICAL IMPRESSION: Liisa continues to progress through her LE strengthening program well. She was limited by some soreness and fatigue d/t most recent session being yesterday. She will begin phase 2 of her rehab protocol next week. We will progress  her exercises as appropriate.    OBJECTIVE IMPAIRMENTS: Abnormal gait, decreased activity tolerance, decreased endurance, decreased mobility, difficulty walking, decreased ROM, decreased strength, increased edema, impaired sensation, improper body mechanics, postural dysfunction, and pain.   ACTIVITY LIMITATIONS: carrying, lifting, bending, standing, squatting, stairs, transfers, and locomotion level  PARTICIPATION LIMITATIONS: meal prep, cleaning, laundry, community activity, and occupation  PERSONAL FACTORS: Past/current experiences and Time since onset of injury/illness/exacerbation are also affecting patient's functional outcome.   REHAB POTENTIAL: Good  CLINICAL DECISION MAKING: Evolving/moderate complexity  EVALUATION COMPLEXITY: Low   GOALS: Goals reviewed with patient? No  SHORT TERM GOALS: Target date: 10/15/2022 Pt will demonstrate appropriate understanding and performance of initially prescribed HEP in order to facilitate improved independence with management of symptoms.  Baseline: HEP provided on eval Goal status: INITIAL   2. Pt will score greater than or equal to 65 on FOTO in order to demonstrate improved perception of function due to symptoms.  Baseline: 63  Goal status:  INITIAL    LONG TERM GOALS: Target date: 11/26/2022  Pt will score 68 or greater on FOTO in order to demonstrate improved perception of function due to symptoms.  Baseline: 63 Goal status: INITIAL  2.  Pt will demonstrate at least 0-120 degrees of left knee AROM in order to facilitate improved tolerance to functional movements such as walking, stair navigation, and transfers.  Baseline: see ROM chart above Goal status: INITIAL  3.  Pt will demonstrate grossly symmetrical knee extension strength via MMT or handheld dynamometer in order to facilitate improved functional kinematics and maximize outcomes. Baseline: deferred given surgical acuity Goal status: INITIAL  4.  Pt will be able to perform 5 consecutive sit to stands without UE support and grossly WNL mechanics to improve safety w/ transfers.  Baseline: UE support for transfers w/ reduced WB through surgical limb Goal status: INITIAL   5. Pt will report ability to stand/walk for up to 1hr in order to maximize tolerance to community navigation and household tasks.  Baseline:  altered mechanics, increased swelling with standing/walking  Goal status: INITIAL   6. Pt will be able to safely navigate up to 5 stairs without UE support in order to facilitate improved independence with home access.   Baseline: altered mechanics, rail use  Goal status: INITIAL   PLAN:  PT FREQUENCY: 2x/week  PT DURATION: 12 weeks  PLANNED INTERVENTIONS: Therapeutic exercises, Therapeutic activity, Neuromuscular re-education, Balance training, Gait training, Patient/Family education, Self Care, Joint mobilization, Stair training, Aquatic Therapy, Dry Needling, Electrical stimulation, Cryotherapy, Moist heat, scar mobilization, Taping, Manual therapy, and Re-evaluation  PLAN FOR NEXT SESSION: Review/update HEP PRN. Work on quad activation, ROM; refer to [employer] protocol (see media tab). Gait training, gross hip/ ankle strengthening.   Mauri Reading, PT, DPT 09/27/22  5:35 PM

## 2022-10-03 ENCOUNTER — Ambulatory Visit: Payer: Medicaid Other

## 2022-10-03 DIAGNOSIS — M25662 Stiffness of left knee, not elsewhere classified: Secondary | ICD-10-CM

## 2022-10-03 DIAGNOSIS — R2689 Other abnormalities of gait and mobility: Secondary | ICD-10-CM

## 2022-10-03 DIAGNOSIS — M25562 Pain in left knee: Secondary | ICD-10-CM

## 2022-10-03 DIAGNOSIS — R600 Localized edema: Secondary | ICD-10-CM

## 2022-10-03 NOTE — Therapy (Signed)
OUTPATIENT PHYSICAL THERAPY TREATMENT NOTE   Patient Name: Kathy White MRN: [id number] DOB:1980-09-20, 42 y.o., female Today's Date: 10/03/2022  END OF SESSION:  PT End of Session - 10/03/22 1538     Visit Number 8    Number of Visits 25    Date for PT Re-Evaluation 10/29/22    Authorization Type [place] medicaid healthy blue    Authorization Time Period 09/04/2022 -12/02/2022    Authorization - Visit Number 7    Authorization - Number of Visits 12    PT Start Time 1538    PT Stop Time 1612    PT Time Calculation (min) 34 min    Activity Tolerance Patient tolerated treatment well;No increased pain    Behavior During Therapy [facility] for tasks assessed/performed                   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   Hematuria 04/21/2013   History of abnormal cervical Pap smear    History of HPV infection    Hypertension    IBS (irritable bowel syndrome)    Left ACL tear    Migraine    OA (osteoarthritis) of knee    Obesity    Pain of joint of left ankle and foot 10/08/2019   PCO (polycystic ovaries)    Pelvic pain 09/16/2018   S/P ACL reconstruction 03/22/2015   Stiffness of left knee 09/25/2021   Vaginal Pap smear, abnormal    Vitamin D deficiency    Past Surgical History:  Procedure Laterality Date   BREAST BIOPSY  08/07/2022   MM RT RADIOACTIVE SEED LOC MAMMO GUIDE 08/07/2022 [facility] MAMMOGRAPHY   BREAST LUMPECTOMY WITH RADIOACTIVE SEED LOCALIZATION Right 08/08/2022   Procedure: RIGHT BREAST LUMPECTOMY WITH RADIOACTIVE  SEED LOCALIZATION;  Surgeon: Griselda Miner, MD;  Location: [facility];  Service: General;  Laterality: Right;   CARPAL TUNNEL RELEASE Right 04/28/2021   CERVICAL BIOPSY  W/ LOOP ELECTRODE EXCISION     CESAREAN SECTION  11/14/2004   EXAM UNDER ANESTHESIA WITH MANIPULATION OF KNEE Left 03/22/2015   Procedure: LEFT KNEE EXAM UNDER ANESTHESIA  ;  Surgeon: Eugenia Mcalpine, MD;  Location: [facility];  Service: Orthopedics;  Laterality: Left;   HAND WOUND EXPLORATION AND TENDON REPAIR'S Right 10/12/2014   right ring and index fingers   KNEE ARTHROSCOPY W/ PARTIAL MEDIAL MENISCECTOMY Left 09/23/2021   KNEE ARTHROSCOPY WITH ANTERIOR CRUCIATE LIGAMENT (ACL) REPAIR WITH HAMSTRING GRAFT Left 03/22/2015   Procedure: LEFT KNEE ARTHROSCOPY WITH ANTERIOR CRUCIATE LIGAMENT (ACL) AUTOGRAFT RECONSTRUCTION;  Surgeon: Eugenia Mcalpine, MD;  Location: [facility];  Service: Orthopedics;  Laterality: Left;  ANESTHESIA: GENERAL/ABDUCTOR CANAL BLOCK   KNEE SURGERY  09/2021   LAPAROSCOPIC CHOLECYSTECTOMY  12/20/2004   LAPAROSCOPIC GASTRIC BAND REMOVAL WITH LAPAROSCOPIC GASTRIC SLEEVE RESECTION  09/09/2017   MENISECTOMY Left 03/22/2015   Procedure: PARTIAL LEFT KNEE  LATERAL MENISECTOMY, CHONDROPLASTY;  Surgeon: Eugenia Mcalpine, MD;  Location: [facility];  Service: Orthopedics;  Laterality: Left;   NERVE, TENDON AND ARTERY REPAIR Right 10/12/2014   Procedure: RIGHT RING FINGER AND RIGHT SMALL FINGER WOUND EXPLORATION WITH TENDON REPAIRS;  Surgeon: Bradly Bienenstock, MD;  Location: [facility];  Service: Orthopedics;  Laterality: Right;   SYNOVECTOMY Left 08/23/2022   Procedure: SYNOVECTOMY;  Surgeon: Bjorn Pippin, MD;  Location: [facility];  Service: Orthopedics;  Laterality: Left;   WISDOM TOOTH EXTRACTION     Patient Active Problem List   Diagnosis Date Noted   Genetic testing 08/14/2022   Family history of uterine cancer 08/02/2022   Family history of  BRCA1 gene positive 08/02/2022   Dysuria 06/11/2022   Burning with urination 05/22/2022   Urinary frequency 05/22/2022   Gastro-esophageal reflux disease without esophagitis 03/08/2022   Neuropathy 03/08/2022   Generalized abdominal pain 03/07/2022   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   Right facial numbness 01/11/2021   Carpal tunnel syndrome of right wrist 01/05/2021   Numbness of hand 01/05/2021   Mass of upper outer quadrant of right breast 07/21/2020   Pain of joint of left ankle and foot 10/08/2019   Arthritis of both knees 10/02/2019   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   Prediabetes 05/30/2017   Vitamin D deficiency 05/30/2017   PCOS (polycystic ovarian syndrome) 01/11/2016   Fibroid 09/15/2015   S/P ACL reconstruction 03/22/2015   Hypertension 03/31/2013   IUD (intrauterine device) in place 02/26/2013   FH: breast cancer in first degree relative 02/26/2013   Leg edema 11/21/2012   Migraine 07/31/2012    PCP: Dois Davenport, MD  REFERRING PROVIDER: Vernetta Honey, PA-C   REFERRING DIAG: Left knee revision ACL reconstruction with allograft 08/23/22   THERAPY DIAG:  Left knee pain, unspecified chronicity  Localized edema  Stiffness of left knee, not elsewhere classified  Other abnormalities of gait and mobility  Rationale for Evaluation and Treatment: Rehabilitation  ONSET DATE: 08/23/22 op date  SUBJECTIVE:   SUBJECTIVE STATEMENT: Patient reporting some L ankle swelling after being on her feel a lot. She is not elevating regularly.    PERTINENT HISTORY: Migraines, prior L ACL repair   PAIN:  Are you having pain: no pain Location/description: L knee Best-worst over past week: denies any pain since POD1  - aggravating factors: being on feet for a while, heat - Easing factors: icing, medication    PRECAUTIONS: s/p R ACLR Delbert Harness Protocol, refer to media tab for details)  Phase 1 0-6 weeks post op: WBAT, goal is  to be without assist by POD10 Knee immobilizer for 1 week with sleep, may be weaned during ambulation once able to do SLR without lag ROM as tolerated, goals of full extension by week 2, 120 deg flexion by week 6  Exercises: extension board and prone hang with ankle weights up to 10lbs. Stationary bike with no resistance for knee flexion (alter set height as ROM increases). Patellar mobilization 5-10 min daily. Quad sets, SLR with knee locked in extension. Begin closed chain work (0-45 deg) when FWB. No restrictions on ankle and hip strengthening.   Phase 2 6-12 weeks post op:  Full WB, no brace  No ROM limitation, increase as tolerated  Ice as much as possible, after therapy at minimum  Exercises: increase CC activities (0-90 deg), add pulley weights, therabands, etc; monitor for anterior knee pain symptoms. Add core strengthening exercises. Add side lunges and/or slideboard. Continue stationary bike and outdoor biking for ROM, strengthening and cardio. Swimming  and water based controlled activities can begin. D/C to HEP if independent.    WEIGHT BEARING RESTRICTIONS: No  FALLS:  Has patient fallen in last 6 months? 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 Mild bruising about lateral incision. No erythema or  excessive warmth  PALPATION: No TTP quad or surgical site, patellar mobility is confounded by edema but appears grossly WNL, no pain  LOWER EXTREMITY ROM:     Active  Right eval Left eval Left 09/04/2022 Left 09/20/2022  Hip flexion      Hip extension      Hip internal rotation      Hip external rotation      Knee extension 0 deg Lacking 5 deg 3 1  Knee flexion 130 deg 80 deg (Able to achieve 90 deg post HEP) 118 122  (Blank rows = not tested) (Key: WFL = within functional limits not formally assessed, * = concordant pain, s = stiffness/stretching sensation, NT = not tested)  Comments: above is painless  LOWER EXTREMITY MMT:    MMT Right eval Left eval  Hip flexion    Hip abduction (modified sitting)    Hip internal rotation    Hip external rotation    Knee flexion    Knee extension    Ankle dorsiflexion     (Blank rows = not tested) (Key: WFL = within functional limits not formally assessed, * = concordant pain, s = stiffness/stretching sensation, NT = not tested)  Comments: deferred on eval given recency of surgery  LOWER EXTREMITY SPECIAL TESTS:  Deferred given recency of surgery  FUNCTIONAL TESTS:  Sit to stand transfer: weight shift towards R, gentle UE support, mild anterior placement of LLE    GAIT: Distance walked: within clinic Assistive device utilized: None Level of assistance: Complete Independence Comments: antalgic gait LLE, reduced stance time and mildly increased trunk sway    TODAY'S TREATMENT:    [facility] Adult PT Treatment:                                                DATE: 10/03/2022  Therapeutic Exercise: Recumbent bike, level 3 x 5 minutes Mini wall squats with ball 0-80 degrees, 3 x 10  Wall sit at end of each set, x10 sec hold  Mini Side lunges with slider, 2 x 10 with UE support for stability  Left only SLR 4 way x 15  Bridge with ball squeeze, 2 x 10, 3 sec hold  SLR bridge x 10 each LE  Supine marches (90-90 variation), 2 x 10  BIL Seated clamshells with green TB above knee, x 30 BIL Change to blue TB next visit Seated marches with  green TB above knee, x 30 each LE  Change to blue TB next visit   [facility] Adult PT Treatment:                                                DATE: 09/27/2022  Therapeutic Exercise: Recumbent bike x 5 min  Quadruped hip mule kicks 3 x 10  R sidelying L hip abduction oscillations at mid range x30 Bridge with ball squeeze, x 25 Glute bridge on pball for CKC hip strengthening, 2 x 10 Supine marches (90-90 variation), 2 x 10 each side  Mini wall squats with ball 0-45 degrees, 3 x 10  Wall sit at end of each set, x12sec hold  Seated clamshells with green TB above knee, x 30 BIL Seated marches with green TB above knee, x 30 each LE Resisted Walking with cable column - fwd/lateral x 5 each                                                                                                                             PATIENT EDUCATION: Education details: Pt education on PT impairments, prognosis, and POC. Informed consent. Rationale for interventions, safe/appropriate HEP performance, safety w/ activity, icing  Person educated: Patient Education method: Explanation, Demonstration, Tactile cues, Verbal cues, and Handouts Education comprehension: verbalized understanding, returned demonstration, verbal cues required, tactile cues required, and needs further education    HOME EXERCISE PROGRAM: Access Code: [email or web address] URL: [email or web address] Date: 09/04/2022 Prepared by: Lulu Riding  Exercises - Active Straight Leg Raise with Quad Set  - 2-3 x daily - 7 x weekly - 1 sets - 5 reps - Long Sitting Quad Set with Towel Roll Under Heel  - 2-3 x daily - 7 x weekly - 1 sets - 10 reps - Supine Hamstring Stretch with Strap  - 1 x daily - 7 x weekly - 2 sets - 2 reps - 30 hold - Seated Hamstring Stretch  - 1 x daily - 7 x weekly - 2 sets - 2 reps - 30 hold - Wall Quarter Squat with  Swiss Ball  - 1 x daily - 7 x weekly - 2 sets - 10 reps - Sidelying Hip Abduction  - 1 x daily - 7 x weekly - 3 sets - 10 reps  ASSESSMENT:  CLINICAL IMPRESSION: Gurleen was able to progress to next phase of rehab protocol d/t post op status without exacerbation of symptoms. We add side lunges, increased ROM with assisted squats and 4-way SLR exercises. She will benefit from ongoing progression of core strengthening activities as well.    OBJECTIVE IMPAIRMENTS: Abnormal gait, decreased activity tolerance, decreased endurance, decreased mobility, difficulty walking, decreased ROM, decreased strength, increased edema, impaired sensation, improper body mechanics, postural dysfunction, and pain.   ACTIVITY LIMITATIONS: carrying, lifting, bending, standing, squatting, stairs, transfers,  and locomotion level  PARTICIPATION LIMITATIONS: meal prep, cleaning, laundry, community activity, and occupation  PERSONAL FACTORS: Past/current experiences and Time since onset of injury/illness/exacerbation are also affecting patient's functional outcome.   REHAB POTENTIAL: Good  CLINICAL DECISION MAKING: Evolving/moderate complexity  EVALUATION COMPLEXITY: Low   GOALS: Goals reviewed with patient? No  SHORT TERM GOALS: Target date: 10/15/2022 Pt will demonstrate appropriate understanding and performance of initially prescribed HEP in order to facilitate improved independence with management of symptoms.  Baseline: HEP provided on eval Goal status: INITIAL   2. Pt will score greater than or equal to 65 on FOTO in order to demonstrate improved perception of function due to symptoms.  Baseline: 63  Goal status: INITIAL    LONG TERM GOALS: Target date: 11/26/2022  Pt will score 68 or greater on FOTO in order to demonstrate improved perception of function due to symptoms.  Baseline: 63 Goal status: INITIAL  2.  Pt will demonstrate at least 0-120 degrees of left knee AROM in order to facilitate improved  tolerance to functional movements such as walking, stair navigation, and transfers.  Baseline: see ROM chart above Goal status: INITIAL  3.  Pt will demonstrate grossly symmetrical knee extension strength via MMT or handheld dynamometer in order to facilitate improved functional kinematics and maximize outcomes. Baseline: deferred given surgical acuity Goal status: INITIAL  4.  Pt will be able to perform 5 consecutive sit to stands without UE support and grossly WNL mechanics to improve safety w/ transfers.  Baseline: UE support for transfers w/ reduced WB through surgical limb Goal status: INITIAL   5. Pt will report ability to stand/walk for up to 1hr in order to maximize tolerance to community navigation and household tasks.  Baseline:  altered mechanics, increased swelling with standing/walking  Goal status: INITIAL   6. Pt will be able to safely navigate up to 5 stairs without UE support in order to facilitate improved independence with home access.   Baseline: altered mechanics, rail use  Goal status: INITIAL   PLAN:  PT FREQUENCY: 2x/week  PT DURATION: 12 weeks  PLANNED INTERVENTIONS: Therapeutic exercises, Therapeutic activity, Neuromuscular re-education, Balance training, Gait training, Patient/Family education, Self Care, Joint mobilization, Stair training, Aquatic Therapy, Dry Needling, Electrical stimulation, Cryotherapy, Moist heat, scar mobilization, Taping, Manual therapy, and Re-evaluation  PLAN FOR NEXT SESSION: last updated 10/03/22 per surgeon's protocol - Exercises: increase CC activities (0-90 deg), add pulley weights, therabands, etc; monitor for anterior knee pain symptoms. Add core strengthening exercises. Add side lunges and/or slideboard. Continue stationary bike and outdoor biking for ROM, strengthening and cardio. Swimming and water based controlled activities can begin. D/C to HEP if independent.    Mauri Reading, PT, DPT 10/03/22  4:19  PM

## 2022-10-04 ENCOUNTER — Ambulatory Visit: Payer: Medicaid Other

## 2022-10-04 DIAGNOSIS — R2689 Other abnormalities of gait and mobility: Secondary | ICD-10-CM

## 2022-10-04 DIAGNOSIS — R600 Localized edema: Secondary | ICD-10-CM

## 2022-10-04 DIAGNOSIS — M25562 Pain in left knee: Secondary | ICD-10-CM

## 2022-10-04 DIAGNOSIS — M25662 Stiffness of left knee, not elsewhere classified: Secondary | ICD-10-CM

## 2022-10-04 NOTE — Therapy (Signed)
OUTPATIENT PHYSICAL THERAPY TREATMENT NOTE   Patient Name: Kathy White MRN: [id number] DOB:05/17/1980, 42 y.o., female Today's Date: 10/04/2022  END OF SESSION:  PT End of Session - 10/04/22 1555     Visit Number 9    Number of Visits 25    Date for PT Re-Evaluation 10/29/22    Authorization Type [place] medicaid healthy blue    Authorization Time Period 09/04/2022 -12/02/2022    Authorization - Visit Number 8    Authorization - Number of Visits 12    PT Start Time 1531    PT Stop Time 1615    PT Time Calculation (min) 44 min    Activity Tolerance Patient tolerated treatment well;No increased pain    Behavior During Therapy [facility] for tasks assessed/performed                    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   Hematuria 04/21/2013   History of abnormal cervical Pap smear    History of HPV infection    Hypertension    IBS (irritable bowel syndrome)    Left ACL tear    Migraine    OA (osteoarthritis) of knee    Obesity    Pain of joint of left ankle and foot 10/08/2019   PCO (polycystic ovaries)    Pelvic pain 09/16/2018   S/P ACL reconstruction 03/22/2015   Stiffness of left knee 09/25/2021   Vaginal Pap smear, abnormal    Vitamin D deficiency    Past Surgical History:  Procedure Laterality Date   BREAST BIOPSY  08/07/2022   MM RT RADIOACTIVE SEED LOC MAMMO GUIDE 08/07/2022 [facility] MAMMOGRAPHY   BREAST LUMPECTOMY WITH RADIOACTIVE SEED LOCALIZATION Right 08/08/2022   Procedure: RIGHT BREAST LUMPECTOMY WITH  RADIOACTIVE SEED LOCALIZATION;  Surgeon: Griselda Miner, MD;  Location: [facility];  Service: General;  Laterality: Right;   CARPAL TUNNEL RELEASE Right 04/28/2021   CERVICAL BIOPSY  W/ LOOP ELECTRODE EXCISION     CESAREAN SECTION  11/14/2004   EXAM UNDER ANESTHESIA WITH MANIPULATION OF KNEE Left 03/22/2015   Procedure: LEFT KNEE EXAM UNDER ANESTHESIA  ;  Surgeon: Eugenia Mcalpine, MD;  Location: [facility];  Service: Orthopedics;  Laterality: Left;   HAND WOUND EXPLORATION AND TENDON REPAIR'S Right 10/12/2014   right ring and index fingers   KNEE ARTHROSCOPY W/ PARTIAL MEDIAL MENISCECTOMY Left 09/23/2021   KNEE ARTHROSCOPY WITH ANTERIOR CRUCIATE LIGAMENT (ACL) REPAIR WITH HAMSTRING GRAFT Left 03/22/2015   Procedure: LEFT KNEE ARTHROSCOPY WITH ANTERIOR CRUCIATE LIGAMENT (ACL) AUTOGRAFT RECONSTRUCTION;  Surgeon: Eugenia Mcalpine, MD;  Location: [facility];  Service: Orthopedics;  Laterality: Left;  ANESTHESIA: GENERAL/ABDUCTOR CANAL BLOCK   KNEE SURGERY  09/2021   LAPAROSCOPIC CHOLECYSTECTOMY  12/20/2004   LAPAROSCOPIC GASTRIC BAND REMOVAL WITH LAPAROSCOPIC GASTRIC SLEEVE RESECTION  09/09/2017   MENISECTOMY Left 03/22/2015   Procedure: PARTIAL LEFT KNEE  LATERAL MENISECTOMY, CHONDROPLASTY;  Surgeon: Eugenia Mcalpine, MD;  Location: [facility];  Service: Orthopedics;  Laterality: Left;   NERVE, TENDON AND ARTERY REPAIR Right 10/12/2014   Procedure: RIGHT RING FINGER AND RIGHT SMALL FINGER WOUND EXPLORATION WITH TENDON REPAIRS;  Surgeon: Bradly Bienenstock, MD;  Location: [facility];  Service: Orthopedics;  Laterality: Right;   SYNOVECTOMY Left 08/23/2022   Procedure: SYNOVECTOMY;  Surgeon: Bjorn Pippin, MD;  Location: [facility];  Service: Orthopedics;  Laterality: Left;   WISDOM TOOTH EXTRACTION     Patient Active Problem List   Diagnosis Date Noted   Genetic testing 08/14/2022   Family history of uterine cancer 08/02/2022   Family  history of BRCA1 gene positive 08/02/2022   Dysuria 06/11/2022   Burning with urination 05/22/2022   Urinary frequency 05/22/2022   Gastro-esophageal reflux disease without esophagitis 03/08/2022   Neuropathy 03/08/2022   Generalized abdominal pain 03/07/2022   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   Right facial numbness 01/11/2021   Carpal tunnel syndrome of right wrist 01/05/2021   Numbness of hand 01/05/2021   Mass of upper outer quadrant of right breast 07/21/2020   Pain of joint of left ankle and foot 10/08/2019   Arthritis of both knees 10/02/2019   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   Prediabetes 05/30/2017   Vitamin D deficiency 05/30/2017   PCOS (polycystic ovarian syndrome) 01/11/2016   Fibroid 09/15/2015   S/P ACL reconstruction 03/22/2015   Hypertension 03/31/2013   IUD (intrauterine device) in place 02/26/2013   FH: breast cancer in first degree relative 02/26/2013   Leg edema 11/21/2012   Migraine 07/31/2012    PCP: Dois Davenport, MD  REFERRING PROVIDER: Vernetta Honey, PA-C   REFERRING DIAG: Left knee revision ACL reconstruction with allograft 08/23/22   THERAPY DIAG:  Left knee pain, unspecified chronicity  Localized edema  Stiffness of left knee, not elsewhere classified  Other abnormalities of gait and mobility  Rationale for Evaluation and Treatment: Rehabilitation  ONSET DATE: 08/23/22 op date  SUBJECTIVE:   SUBJECTIVE STATEMENT: Patient reporting some improvement in Lt ankle swelling today. She is also reporting general fatigue today.    PERTINENT HISTORY: Migraines, prior L ACL repair   PAIN:  Are you having pain: no pain Location/description: L knee Best-worst over past week: denies any pain since POD1  - aggravating factors: being on feet for a while, heat - Easing factors: icing, medication    PRECAUTIONS: s/p R ACLR Delbert Harness Protocol, refer to media tab for details)  Phase 1 0-6 weeks post  op: WBAT, goal is to be without assist by POD10 Knee immobilizer for 1 week with sleep, may be weaned during ambulation once able to do SLR without lag ROM as tolerated, goals of full extension by week 2, 120 deg flexion by week 6  Exercises: extension board and prone hang with ankle weights up to 10lbs. Stationary bike with no resistance for knee flexion (alter set height as ROM increases). Patellar mobilization 5-10 min daily. Quad sets, SLR with knee locked in extension. Begin closed chain work (0-45 deg) when FWB. No restrictions on ankle and hip strengthening.   Phase 2 6-12 weeks post op:  Full WB, no brace  No ROM limitation, increase as tolerated  Ice as much as possible, after therapy at minimum  Exercises: increase CC activities (0-90 deg), add pulley weights, therabands, etc; monitor for anterior knee pain symptoms. Add core strengthening exercises. Add side lunges and/or slideboard. Continue stationary bike and outdoor biking for ROM, strengthening and cardio. Swimming and water  based controlled activities can begin. D/C to HEP if independent.    WEIGHT BEARING RESTRICTIONS: No  FALLS:  Has patient fallen in last 6 months? 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 Mild bruising about lateral incision.  No erythema or excessive warmth  PALPATION: No TTP quad or surgical site, patellar mobility is confounded by edema but appears grossly WNL, no pain  LOWER EXTREMITY ROM:     Active  Right eval Left eval Left 09/04/2022 Left 09/20/2022  Hip flexion      Hip extension      Hip internal rotation      Hip external rotation      Knee extension 0 deg Lacking 5 deg 3 1  Knee flexion 130 deg 80 deg (Able to achieve 90 deg post HEP) 118 122  (Blank rows = not tested) (Key: WFL = within functional limits not formally assessed, * = concordant pain, s = stiffness/stretching sensation, NT = not tested)  Comments: above is painless  LOWER EXTREMITY MMT:    MMT Right eval Left eval  Hip flexion    Hip abduction (modified sitting)    Hip internal rotation    Hip external rotation    Knee flexion    Knee extension    Ankle dorsiflexion     (Blank rows = not tested) (Key: WFL = within functional limits not formally assessed, * = concordant pain, s = stiffness/stretching sensation, NT = not tested)  Comments: deferred on eval given recency of surgery  LOWER EXTREMITY SPECIAL TESTS:  Deferred given recency of surgery  FUNCTIONAL TESTS:  Sit to stand transfer: weight shift towards R, gentle UE support, mild anterior placement of LLE    GAIT: Distance walked: within clinic Assistive device utilized: None Level of assistance: Complete Independence Comments: antalgic gait LLE, reduced stance time and mildly increased trunk sway    TODAY'S TREATMENT:     [facility] Adult PT Treatment:                                                DATE: 10/03/2022  Therapeutic Exercise: Recumbent bike, level 3 x 5 minutes Mini wall squats with ball 0-80 degrees, 3 x 10  Wall sit at end of each set, x10 sec hold  Mini Side lunges with slider, 2 x 10 with UE support for stability  Left only SLR 4 way x 15  Bridge with ball squeeze, 2 x 15, 3 sec hold  SL bridge x 10 each LE  Supine marches (90-90  variation), x 15 BIL Supine (90-90) Hollow Hold, 3 x 10 sec  Seated clamshells with blue TB above knee, x 30 BIL Seated marches  with blue TB above knee, x 30 each LE  [facility] Adult PT Treatment:                                                DATE: 09/27/2022  Therapeutic Exercise: Recumbent bike x 5 min  Quadruped hip mule kicks 3 x 10  R sidelying L hip abduction oscillations at mid range x30 Bridge with ball squeeze, x 25 Glute bridge on pball for CKC hip strengthening, 2 x 10 Supine marches (90-90 variation), 2 x 10 each side  Mini wall squats with ball 0-45 degrees, 3 x 10  Wall sit at end of each set, x12sec hold  Seated clamshells with green TB above knee, x 30 BIL Seated marches with green TB above knee, x 30 each LE Resisted Walking with cable column - fwd/lateral x 5 each                                                                                                                             PATIENT EDUCATION: Education details: Pt education on PT impairments, prognosis, and POC. Informed consent. Rationale for interventions, safe/appropriate HEP performance, safety w/ activity, icing  Person educated: Patient Education method: Explanation, Demonstration, Tactile cues, Verbal cues, and Handouts Education comprehension: verbalized understanding, returned demonstration, verbal cues required, tactile cues required, and needs further education    HOME EXERCISE PROGRAM: Access Code: [email or web address] URL: [email or web address] Date: 10/04/2022 Prepared by: Mauri Reading  Exercises - Active Straight Leg Raise with Quad Set  - 1 x daily - 7 x weekly - 2 sets - 10-15 reps - Sidelying Hip Abduction  - 1 x daily - 7 x weekly - 2 sets - 10-15 reps - Prone Hip Extension  - 1 x daily - 7 x weekly - 2 sets - 10-15 reps - Sidelying Hip Adduction  - 1 x daily - 7 x weekly - 2 sets - 10-15 reps - Wall Quarter Squat with Swiss Ball  - 1 x daily - 7 x weekly - 2 sets - 10 reps -  Single Leg Sit to Stand with Arms Crossed  - 1 x daily - 7 x weekly - 2 sets - 10 reps  ASSESSMENT:  CLINICAL IMPRESSION: Delitha responded well to exercise progression at yesterday's visit and was also able to tolerate increased resistance with seated exercises today. She continues to be challenged with core exercises. Updated home program and reviewed with patient as necessary for current phase of rehab protocol. We will continue to progress exercises as appropriate.    OBJECTIVE IMPAIRMENTS: Abnormal gait, decreased activity tolerance, decreased endurance, decreased mobility, difficulty walking, decreased ROM, decreased strength, increased edema, impaired sensation, improper body mechanics, postural dysfunction, and pain.   ACTIVITY LIMITATIONS: carrying, lifting, bending, standing, squatting, stairs, transfers, and locomotion level  PARTICIPATION LIMITATIONS: meal prep,  cleaning, laundry, community activity, and occupation  PERSONAL FACTORS: Past/current experiences and Time since onset of injury/illness/exacerbation are also affecting patient's functional outcome.   REHAB POTENTIAL: Good  CLINICAL DECISION MAKING: Evolving/moderate complexity  EVALUATION COMPLEXITY: Low   GOALS: Goals reviewed with patient? No  SHORT TERM GOALS: Target date: 10/15/2022 Pt will demonstrate appropriate understanding and performance of initially prescribed HEP in order to facilitate improved independence with management of symptoms.  Baseline: HEP provided on eval Goal status: INITIAL   2. Pt will score greater than or equal to 65 on FOTO in order to demonstrate improved perception of function due to symptoms.  Baseline: 63  Goal status: INITIAL    LONG TERM GOALS: Target date: 11/26/2022  Pt will score 68 or greater on FOTO in order to demonstrate improved perception of function due to symptoms.  Baseline: 63 Goal status: INITIAL  2.  Pt will demonstrate at least 0-120 degrees of left knee AROM  in order to facilitate improved tolerance to functional movements such as walking, stair navigation, and transfers.  Baseline: see ROM chart above Goal status: INITIAL  3.  Pt will demonstrate grossly symmetrical knee extension strength via MMT or handheld dynamometer in order to facilitate improved functional kinematics and maximize outcomes. Baseline: deferred given surgical acuity Goal status: INITIAL  4.  Pt will be able to perform 5 consecutive sit to stands without UE support and grossly WNL mechanics to improve safety w/ transfers.  Baseline: UE support for transfers w/ reduced WB through surgical limb Goal status: INITIAL   5. Pt will report ability to stand/walk for up to 1hr in order to maximize tolerance to community navigation and household tasks.  Baseline:  altered mechanics, increased swelling with standing/walking  Goal status: INITIAL   6. Pt will be able to safely navigate up to 5 stairs without UE support in order to facilitate improved independence with home access.   Baseline: altered mechanics, rail use  Goal status: INITIAL   PLAN:  PT FREQUENCY: 2x/week  PT DURATION: 12 weeks  PLANNED INTERVENTIONS: Therapeutic exercises, Therapeutic activity, Neuromuscular re-education, Balance training, Gait training, Patient/Family education, Self Care, Joint mobilization, Stair training, Aquatic Therapy, Dry Needling, Electrical stimulation, Cryotherapy, Moist heat, scar mobilization, Taping, Manual therapy, and Re-evaluation  PLAN FOR NEXT SESSION: last updated 10/03/22 per surgeon's protocol - Exercises: increase CC activities (0-90 deg), add pulley weights, therabands, etc; monitor for anterior knee pain symptoms. Add core strengthening exercises. Add side lunges and/or slideboard. Continue stationary bike and outdoor biking for ROM, strengthening and cardio. Swimming and water based controlled activities can begin. D/C to HEP if independent.    Mauri Reading, PT,  DPT 10/04/22  4:19 PM

## 2022-10-10 ENCOUNTER — Ambulatory Visit: Payer: Medicaid Other

## 2022-10-10 DIAGNOSIS — R2689 Other abnormalities of gait and mobility: Secondary | ICD-10-CM

## 2022-10-10 DIAGNOSIS — M25562 Pain in left knee: Secondary | ICD-10-CM | POA: Diagnosis not present

## 2022-10-10 DIAGNOSIS — M25662 Stiffness of left knee, not elsewhere classified: Secondary | ICD-10-CM

## 2022-10-10 NOTE — Therapy (Signed)
OUTPATIENT PHYSICAL THERAPY TREATMENT NOTE   Patient Name: Kathy White MRN: [id number] DOB:1980/11/24, 42 y.o., female Today's Date: 10/10/2022  END OF SESSION:  PT End of Session - 10/10/22 1534     Visit Number 10    Number of Visits 25    Date for PT Re-Evaluation 10/29/22    Authorization Type [place] medicaid healthy blue    Authorization Time Period 09/04/2022 -12/02/2022    Authorization - Visit Number 9    Authorization - Number of Visits 12    PT Start Time 1531    PT Stop Time 1614    PT Time Calculation (min) 43 min    Activity Tolerance Patient tolerated treatment well;No increased pain    Behavior During Therapy [facility] for tasks assessed/performed                     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   Hematuria 04/21/2013   History of abnormal cervical Pap smear    History of HPV infection    Hypertension    IBS (irritable bowel syndrome)    Left ACL tear    Migraine    OA (osteoarthritis) of knee    Obesity    Pain of joint of left ankle and foot 10/08/2019   PCO (polycystic ovaries)    Pelvic pain 09/16/2018   S/P ACL reconstruction 03/22/2015   Stiffness of left knee 09/25/2021   Vaginal Pap smear, abnormal    Vitamin D deficiency    Past Surgical History:  Procedure Laterality Date   BREAST BIOPSY  08/07/2022   MM RT RADIOACTIVE SEED LOC MAMMO GUIDE 08/07/2022 [facility] MAMMOGRAPHY   BREAST LUMPECTOMY WITH RADIOACTIVE SEED LOCALIZATION Right 08/08/2022   Procedure: RIGHT BREAST LUMPECTOMY WITH  RADIOACTIVE SEED LOCALIZATION;  Surgeon: Griselda Miner, MD;  Location: [facility];  Service: General;  Laterality: Right;   CARPAL TUNNEL RELEASE Right 04/28/2021   CERVICAL BIOPSY  W/ LOOP ELECTRODE EXCISION     CESAREAN SECTION  11/14/2004   EXAM UNDER ANESTHESIA WITH MANIPULATION OF KNEE Left 03/22/2015   Procedure: LEFT KNEE EXAM UNDER ANESTHESIA  ;  Surgeon: Eugenia Mcalpine, MD;  Location: [facility];  Service: Orthopedics;  Laterality: Left;   HAND WOUND EXPLORATION AND TENDON REPAIR'S Right 10/12/2014   right ring and index fingers   KNEE ARTHROSCOPY W/ PARTIAL MEDIAL MENISCECTOMY Left 09/23/2021   KNEE ARTHROSCOPY WITH ANTERIOR CRUCIATE LIGAMENT (ACL) REPAIR WITH HAMSTRING GRAFT Left 03/22/2015   Procedure: LEFT KNEE ARTHROSCOPY WITH ANTERIOR CRUCIATE LIGAMENT (ACL) AUTOGRAFT RECONSTRUCTION;  Surgeon: Eugenia Mcalpine, MD;  Location: [facility];  Service: Orthopedics;  Laterality: Left;  ANESTHESIA: GENERAL/ABDUCTOR CANAL BLOCK   KNEE SURGERY  09/2021   LAPAROSCOPIC CHOLECYSTECTOMY  12/20/2004   LAPAROSCOPIC GASTRIC BAND REMOVAL WITH LAPAROSCOPIC GASTRIC SLEEVE RESECTION  09/09/2017   MENISECTOMY Left 03/22/2015   Procedure: PARTIAL LEFT KNEE  LATERAL MENISECTOMY, CHONDROPLASTY;  Surgeon: Eugenia Mcalpine, MD;  Location: [facility];  Service: Orthopedics;  Laterality: Left;   NERVE, TENDON AND ARTERY REPAIR Right 10/12/2014   Procedure: RIGHT RING FINGER AND RIGHT SMALL FINGER WOUND EXPLORATION WITH TENDON REPAIRS;  Surgeon: Bradly Bienenstock, MD;  Location: [facility];  Service: Orthopedics;  Laterality: Right;   SYNOVECTOMY Left 08/23/2022   Procedure: SYNOVECTOMY;  Surgeon: Bjorn Pippin, MD;  Location: [facility];  Service: Orthopedics;  Laterality: Left;   WISDOM TOOTH EXTRACTION     Patient Active Problem List   Diagnosis Date Noted   Genetic testing 08/14/2022   Family history of uterine cancer 08/02/2022   Family  history of BRCA1 gene positive 08/02/2022   Dysuria 06/11/2022   Burning with urination 05/22/2022   Urinary frequency 05/22/2022   Gastro-esophageal reflux disease without esophagitis 03/08/2022   Neuropathy 03/08/2022   Generalized abdominal pain 03/07/2022   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   Right facial numbness 01/11/2021   Carpal tunnel syndrome of right wrist 01/05/2021   Numbness of hand 01/05/2021   Mass of upper outer quadrant of right breast 07/21/2020   Pain of joint of left ankle and foot 10/08/2019   Arthritis of both knees 10/02/2019   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   Prediabetes 05/30/2017   Vitamin D deficiency 05/30/2017   PCOS (polycystic ovarian syndrome) 01/11/2016   Fibroid 09/15/2015   S/P ACL reconstruction 03/22/2015   Hypertension 03/31/2013   IUD (intrauterine device) in place 02/26/2013   FH: breast cancer in first degree relative 02/26/2013   Leg edema 11/21/2012   Migraine 07/31/2012    PCP: Dois Davenport, MD  REFERRING PROVIDER: Vernetta Honey, PA-C   REFERRING DIAG: Left knee revision ACL reconstruction with allograft 08/23/22   THERAPY DIAG:  Left knee pain, unspecified chronicity  Stiffness of left knee, not elsewhere classified  Other abnormalities of gait and mobility  Rationale for Evaluation and Treatment: Rehabilitation  ONSET DATE: 08/23/22 op date  SUBJECTIVE:   SUBJECTIVE STATEMENT: Patient reporting no recent changes in symptoms.    PERTINENT HISTORY: Migraines, prior L ACL repair   PAIN:  Are you having pain: no pain Location/description: L knee Best-worst over past week: denies any pain since POD1  - aggravating factors: being on feet for a while, heat - Easing factors: icing, medication    PRECAUTIONS: s/p R ACLR Delbert Harness Protocol, refer to media tab for details)  Phase 1 0-6 weeks post op: WBAT, goal is to be without assist by POD10 Knee immobilizer for 1 week  with sleep, may be weaned during ambulation once able to do SLR without lag ROM as tolerated, goals of full extension by week 2, 120 deg flexion by week 6  Exercises: extension board and prone hang with ankle weights up to 10lbs. Stationary bike with no resistance for knee flexion (alter set height as ROM increases). Patellar mobilization 5-10 min daily. Quad sets, SLR with knee locked in extension. Begin closed chain work (0-45 deg) when FWB. No restrictions on ankle and hip strengthening.   Phase 2 6-12 weeks post op:  Full WB, no brace  No ROM limitation, increase as tolerated  Ice as much as possible, after therapy at minimum  Exercises: increase CC activities (0-90 deg), add pulley weights, therabands, etc; monitor for anterior knee pain symptoms. Add core strengthening exercises. Add side lunges and/or slideboard. Continue stationary bike and outdoor biking for ROM, strengthening and cardio. Swimming and water based controlled activities can begin. D/C to HEP if independent.  WEIGHT BEARING RESTRICTIONS: No  FALLS:  Has patient fallen in last 6 months? 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 Mild bruising about lateral incision. No erythema or excessive warmth  PALPATION: No TTP quad or surgical site,  patellar mobility is confounded by edema but appears grossly WNL, no pain  LOWER EXTREMITY ROM:     Active  Right eval Left eval Left 09/04/2022 Left 09/20/2022  Hip flexion      Hip extension      Hip internal rotation      Hip external rotation      Knee extension 0 deg Lacking 5 deg 3 1  Knee flexion 130 deg 80 deg (Able to achieve 90 deg post HEP) 118 122  (Blank rows = not tested) (Key: WFL = within functional limits not formally assessed, * = concordant pain, s = stiffness/stretching sensation, NT = not tested)  Comments: above is painless  LOWER EXTREMITY MMT:    MMT Right eval Left eval  Hip flexion    Hip abduction (modified sitting)    Hip internal rotation    Hip external rotation    Knee flexion    Knee extension    Ankle dorsiflexion     (Blank rows = not tested) (Key: WFL = within functional limits not formally assessed, * = concordant pain, s = stiffness/stretching sensation, NT = not tested)  Comments: deferred on eval given recency of surgery  LOWER EXTREMITY SPECIAL TESTS:  Deferred given recency of surgery  FUNCTIONAL TESTS:  Sit to stand transfer: weight shift towards R, gentle UE support, mild anterior placement of LLE    GAIT: Distance walked: within clinic Assistive device utilized: None Level of assistance: Complete Independence Comments: antalgic gait LLE, reduced stance time and mildly increased trunk sway    TODAY'S TREATMENT:    [facility] Adult PT Treatment:                                                DATE: 10/10/2022  Therapeutic Exercise: Recumbent bike, level 3 x 5 minutes Mini wall squats with ball 0-80 degrees, 2 x 10  Wall sit at end of each set, x15 sec hold  Mini Side lunges with slider, x 15 with UE support for stability  Left only SLR 4 way x 15  Bridge with ball squeeze, 2 x 15, 3 sec hold  SL bridge x 10 each LE  Supine marches (90-90 variation), x 20 BIL Supine (90-90) Hollow Hold, 3 x 15 sec  Seated clamshells with  blue TB above knee, 2 x15 BIL  Consider increasing resistance with hip and core strengthening as tolerated.    [facility] Adult PT  Treatment:                                                DATE: 10/03/2022  Therapeutic Exercise: Recumbent bike, level 3 x 5 minutes Mini wall squats with ball 0-80 degrees, 3 x 10  Wall sit at end of each set, x10 sec hold  Mini Side lunges with slider, 2 x 10 with UE support for stability  Left only SLR 4 way x 15  Bridge with ball squeeze, 2 x 15, 3 sec hold  SL bridge x 10 each LE  Supine marches (90-90 variation), x 15 BIL Supine (90-90) Hollow Hold, 3 x 10 sec  Seated clamshells with blue TB above knee, x 30 BIL Seated marches with blue TB above knee, x 30 each LE  [facility] Adult PT Treatment:                                                DATE: 09/27/2022  Therapeutic Exercise: Recumbent bike x 5 min  Quadruped hip mule kicks 3 x 10  R sidelying L hip abduction oscillations at mid range x30 Bridge with ball squeeze, x 25 Glute bridge on pball for CKC hip strengthening, 2 x 10 Supine marches (90-90 variation), 2 x 10 each side  Mini wall squats with ball 0-45 degrees, 3 x 10  Wall sit at end of each set, x12sec hold  Seated clamshells with green TB above knee, x 30 BIL Seated marches with green TB above knee, x 30 each LE Resisted Walking with cable column - fwd/lateral x 5 each                                                                                                                             PATIENT EDUCATION: Education details: Pt education on PT impairments, prognosis, and POC. Informed consent. Rationale for interventions, safe/appropriate HEP performance, safety w/ activity, icing  Person educated: Patient Education method: Explanation, Demonstration, Tactile cues, Verbal cues, and Handouts Education comprehension: verbalized understanding, returned demonstration, verbal cues required, tactile cues required, and needs further education     HOME EXERCISE PROGRAM: Access Code: [email or web address] URL: [email or web address] Date: 10/04/2022 Prepared by: Mauri Reading  Exercises - Active Straight Leg Raise with Quad Set  - 1 x daily - 7 x weekly - 2 sets - 10-15 reps - Sidelying Hip Abduction  - 1 x daily - 7 x weekly - 2 sets - 10-15 reps - Prone Hip Extension  - 1 x daily - 7 x weekly - 2 sets - 10-15 reps - Sidelying Hip Adduction  - 1 x daily - 7 x weekly - 2 sets - 10-15 reps - Wall Quarter  Squat with [employer]  - 1 x daily - 7 x weekly - 2 sets - 10 reps - Single Leg Sit to Stand with Arms Crossed  - 1 x daily - 7 x weekly - 2 sets - 10 reps  ASSESSMENT:  CLINICAL IMPRESSION: Aldeen continues to tolerate progression of LE strengthening activities well without exacerbation of symptoms. She is most limited by hip weakness, core weakness and is able to tolerate exercises within current rehab phase per surgeon's protocol. Plan is to add resistance and repetitions as tolerated. At next week's progress note assessment, we will evaluate R elbow pain per new MD referral.     OBJECTIVE IMPAIRMENTS: Abnormal gait, decreased activity tolerance, decreased endurance, decreased mobility, difficulty walking, decreased ROM, decreased strength, increased edema, impaired sensation, improper body mechanics, postural dysfunction, and pain.   ACTIVITY LIMITATIONS: carrying, lifting, bending, standing, squatting, stairs, transfers, and locomotion level  PARTICIPATION LIMITATIONS: meal prep, cleaning, laundry, community activity, and occupation  PERSONAL FACTORS: Past/current experiences and Time since onset of injury/illness/exacerbation are also affecting patient's functional outcome.   REHAB POTENTIAL: Good  CLINICAL DECISION MAKING: Evolving/moderate complexity  EVALUATION COMPLEXITY: Low   GOALS: Goals reviewed with patient? No  SHORT TERM GOALS: Target date: 10/15/2022 Pt will demonstrate appropriate understanding and  performance of initially prescribed HEP in order to facilitate improved independence with management of symptoms.  Baseline: HEP provided on eval Goal status: INITIAL   2. Pt will score greater than or equal to 65 on FOTO in order to demonstrate improved perception of function due to symptoms.  Baseline: 63  Goal status: INITIAL    LONG TERM GOALS: Target date: 11/26/2022  Pt will score 68 or greater on FOTO in order to demonstrate improved perception of function due to symptoms.  Baseline: 63 Goal status: INITIAL  2.  Pt will demonstrate at least 0-120 degrees of left knee AROM in order to facilitate improved tolerance to functional movements such as walking, stair navigation, and transfers.  Baseline: see ROM chart above Goal status: INITIAL  3.  Pt will demonstrate grossly symmetrical knee extension strength via MMT or handheld dynamometer in order to facilitate improved functional kinematics and maximize outcomes. Baseline: deferred given surgical acuity Goal status: INITIAL  4.  Pt will be able to perform 5 consecutive sit to stands without UE support and grossly WNL mechanics to improve safety w/ transfers.  Baseline: UE support for transfers w/ reduced WB through surgical limb Goal status: INITIAL   5. Pt will report ability to stand/walk for up to 1hr in order to maximize tolerance to community navigation and household tasks.  Baseline:  altered mechanics, increased swelling with standing/walking  Goal status: INITIAL   6. Pt will be able to safely navigate up to 5 stairs without UE support in order to facilitate improved independence with home access.   Baseline: altered mechanics, rail use  Goal status: INITIAL   PLAN:  PT FREQUENCY: 2x/week  PT DURATION: 12 weeks  PLANNED INTERVENTIONS: Therapeutic exercises, Therapeutic activity, Neuromuscular re-education, Balance training, Gait training, Patient/Family education, Self Care, Joint mobilization, Stair training,  Aquatic Therapy, Dry Needling, Electrical stimulation, Cryotherapy, Moist heat, scar mobilization, Taping, Manual therapy, and Re-evaluation  PLAN FOR NEXT SESSION: last updated 10/03/22 per surgeon's protocol -   Exercises: increase CC activities (0-90 deg), add pulley weights, therabands, etc; monitor for anterior knee pain symptoms. Add core strengthening exercises. Add side lunges and/or slideboard. Continue stationary bike and outdoor biking for ROM, strengthening and cardio.  Swimming and water based controlled activities can begin. D/C to HEP if independent.    Mauri Reading, PT, DPT 10/10/22  4:20 PM

## 2022-10-11 ENCOUNTER — Ambulatory Visit: Payer: Medicaid Other | Attending: Physician Assistant

## 2022-10-11 DIAGNOSIS — R600 Localized edema: Secondary | ICD-10-CM | POA: Insufficient documentation

## 2022-10-11 DIAGNOSIS — M25562 Pain in left knee: Secondary | ICD-10-CM

## 2022-10-11 DIAGNOSIS — R2689 Other abnormalities of gait and mobility: Secondary | ICD-10-CM | POA: Diagnosis present

## 2022-10-11 DIAGNOSIS — M25662 Stiffness of left knee, not elsewhere classified: Secondary | ICD-10-CM

## 2022-10-11 DIAGNOSIS — M25521 Pain in right elbow: Secondary | ICD-10-CM | POA: Diagnosis present

## 2022-10-11 NOTE — Therapy (Signed)
OUTPATIENT PHYSICAL THERAPY TREATMENT NOTE   Patient Name: Kathy White HEADLEE MRN: [id number] DOB:1980-09-26, 42 y.o., female Today's Date: 10/11/2022  END OF SESSION:  PT End of Session - 10/11/22 1540     Visit Number 11    Number of Visits 25    Date for PT Re-Evaluation 10/29/22    Authorization Type [place] medicaid healthy blue    Authorization Time Period 09/04/2022 -12/02/2022    Authorization - Visit Number 10    Authorization - Number of Visits 12    PT Start Time 1533    PT Stop Time 1616    PT Time Calculation (min) 43 min    Activity Tolerance Patient tolerated treatment well;No increased pain    Behavior During Therapy [facility] for tasks assessed/performed                      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   Hematuria 04/21/2013   History of abnormal cervical Pap smear    History of HPV infection    Hypertension    IBS (irritable bowel syndrome)    Left ACL tear    Migraine    OA (osteoarthritis) of knee    Obesity    Pain of joint of left ankle and foot 10/08/2019   PCO (polycystic ovaries)    Pelvic pain 09/16/2018   S/P ACL reconstruction 03/22/2015   Stiffness of left knee 09/25/2021   Vaginal Pap smear, abnormal    Vitamin D deficiency    Past Surgical History:  Procedure Laterality Date   BREAST BIOPSY  08/07/2022   MM RT RADIOACTIVE SEED LOC MAMMO GUIDE 08/07/2022 [facility] MAMMOGRAPHY   BREAST LUMPECTOMY WITH RADIOACTIVE SEED LOCALIZATION Right 08/08/2022   Procedure: RIGHT BREAST LUMPECTOMY WITH  RADIOACTIVE SEED LOCALIZATION;  Surgeon: Griselda Miner, MD;  Location: [facility];  Service: General;  Laterality: Right;   CARPAL TUNNEL RELEASE Right 04/28/2021   CERVICAL BIOPSY  W/ LOOP ELECTRODE EXCISION     CESAREAN SECTION  11/14/2004   EXAM UNDER ANESTHESIA WITH MANIPULATION OF KNEE Left 03/22/2015   Procedure: LEFT KNEE EXAM UNDER ANESTHESIA  ;  Surgeon: Eugenia Mcalpine, MD;  Location: [facility];  Service: Orthopedics;  Laterality: Left;   HAND WOUND EXPLORATION AND TENDON REPAIR'S Right 10/12/2014   right ring and index fingers   KNEE ARTHROSCOPY W/ PARTIAL MEDIAL MENISCECTOMY Left 09/23/2021   KNEE ARTHROSCOPY WITH ANTERIOR CRUCIATE LIGAMENT (ACL) REPAIR WITH HAMSTRING GRAFT Left 03/22/2015   Procedure: LEFT KNEE ARTHROSCOPY WITH ANTERIOR CRUCIATE LIGAMENT (ACL) AUTOGRAFT RECONSTRUCTION;  Surgeon: Eugenia Mcalpine, MD;  Location: [facility];  Service: Orthopedics;  Laterality: Left;  ANESTHESIA: GENERAL/ABDUCTOR CANAL BLOCK   KNEE SURGERY  09/2021   LAPAROSCOPIC CHOLECYSTECTOMY  12/20/2004   LAPAROSCOPIC GASTRIC BAND REMOVAL WITH LAPAROSCOPIC GASTRIC SLEEVE RESECTION  09/09/2017   MENISECTOMY Left 03/22/2015   Procedure: PARTIAL LEFT KNEE  LATERAL MENISECTOMY, CHONDROPLASTY;  Surgeon: Eugenia Mcalpine, MD;  Location: [facility];  Service: Orthopedics;  Laterality: Left;   NERVE, TENDON AND ARTERY REPAIR Right 10/12/2014   Procedure: RIGHT RING FINGER AND RIGHT SMALL FINGER WOUND EXPLORATION WITH TENDON REPAIRS;  Surgeon: Bradly Bienenstock, MD;  Location: [facility];  Service: Orthopedics;  Laterality: Right;   SYNOVECTOMY Left 08/23/2022   Procedure: SYNOVECTOMY;  Surgeon: Bjorn Pippin, MD;  Location: [facility];  Service: Orthopedics;  Laterality: Left;   WISDOM TOOTH EXTRACTION     Patient Active Problem List   Diagnosis Date Noted   Genetic testing 08/14/2022   Family history of uterine cancer 08/02/2022   Family  history of BRCA1 gene positive 08/02/2022   Dysuria 06/11/2022   Burning with urination 05/22/2022   Urinary frequency 05/22/2022   Gastro-esophageal reflux disease without esophagitis 03/08/2022   Neuropathy 03/08/2022   Generalized abdominal pain 03/07/2022   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   Right facial numbness 01/11/2021   Carpal tunnel syndrome of right wrist 01/05/2021   Numbness of hand 01/05/2021   Mass of upper outer quadrant of right breast 07/21/2020   Pain of joint of left ankle and foot 10/08/2019   Arthritis of both knees 10/02/2019   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   Prediabetes 05/30/2017   Vitamin D deficiency 05/30/2017   PCOS (polycystic ovarian syndrome) 01/11/2016   Fibroid 09/15/2015   S/P ACL reconstruction 03/22/2015   Hypertension 03/31/2013   IUD (intrauterine device) in place 02/26/2013   FH: breast cancer in first degree relative 02/26/2013   Leg edema 11/21/2012   Migraine 07/31/2012    PCP: Dois Davenport, MD  REFERRING PROVIDER: Vernetta Honey, PA-C   REFERRING DIAG: Left knee revision ACL reconstruction with allograft 08/23/22   THERAPY DIAG:  Left knee pain, unspecified chronicity  Stiffness of left knee, not elsewhere classified  Other abnormalities of gait and mobility  Rationale for Evaluation and Treatment: Rehabilitation  ONSET DATE: 08/23/22 op date  SUBJECTIVE:   SUBJECTIVE STATEMENT: Patient reporting no changes in symptoms since yesterday's tx session.    PERTINENT HISTORY: Migraines, prior L ACL repair   PAIN:  Are you having pain: no pain Location/description: L knee Best-worst over past week: denies any pain since POD1  - aggravating factors: being on feet for a while, heat - Easing factors: icing, medication    PRECAUTIONS: s/p R ACLR Delbert Harness Protocol, refer to media tab for details)  Phase 1 0-6 weeks post op: WBAT, goal is to be without assist by POD10 Knee  immobilizer for 1 week with sleep, may be weaned during ambulation once able to do SLR without lag ROM as tolerated, goals of full extension by week 2, 120 deg flexion by week 6  Exercises: extension board and prone hang with ankle weights up to 10lbs. Stationary bike with no resistance for knee flexion (alter set height as ROM increases). Patellar mobilization 5-10 min daily. Quad sets, SLR with knee locked in extension. Begin closed chain work (0-45 deg) when FWB. No restrictions on ankle and hip strengthening.   Phase 2 6-12 weeks post op:  Full WB, no brace  No ROM limitation, increase as tolerated  Ice as much as possible, after therapy at minimum  Exercises: increase CC activities (0-90 deg), add pulley weights, therabands, etc; monitor for anterior knee pain symptoms. Add core strengthening exercises. Add side lunges and/or slideboard. Continue stationary bike and outdoor biking for ROM, strengthening and cardio. Swimming and water based controlled activities can begin. D/C  to HEP if independent.    WEIGHT BEARING RESTRICTIONS: No  FALLS:  Has patient fallen in last 6 months? 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 Mild bruising about lateral incision. No erythema or excessive warmth  PALPATION: No TTP  quad or surgical site, patellar mobility is confounded by edema but appears grossly WNL, no pain  LOWER EXTREMITY ROM:     Active  Right eval Left eval Left 09/04/2022 Left 09/20/2022  Hip flexion      Hip extension      Hip internal rotation      Hip external rotation      Knee extension 0 deg Lacking 5 deg 3 1  Knee flexion 130 deg 80 deg (Able to achieve 90 deg post HEP) 118 122  (Blank rows = not tested) (Key: WFL = within functional limits not formally assessed, * = concordant pain, s = stiffness/stretching sensation, NT = not tested)  Comments: above is painless  LOWER EXTREMITY MMT:    MMT Right eval Left eval  Hip flexion    Hip abduction (modified sitting)    Hip internal rotation    Hip external rotation    Knee flexion    Knee extension    Ankle dorsiflexion     (Blank rows = not tested) (Key: WFL = within functional limits not formally assessed, * = concordant pain, s = stiffness/stretching sensation, NT = not tested)  Comments: deferred on eval given recency of surgery  LOWER EXTREMITY SPECIAL TESTS:  Deferred given recency of surgery  FUNCTIONAL TESTS:  Sit to stand transfer: weight shift towards R, gentle UE support, mild anterior placement of LLE    GAIT: Distance walked: within clinic Assistive device utilized: None Level of assistance: Complete Independence Comments: antalgic gait LLE, reduced stance time and mildly increased trunk sway    TODAY'S TREATMENT:    [facility] Adult PT Treatment:                                                DATE: 10/11/2022  Therapeutic Exercise: Treadmill forward, 2% incline with self-selected pace x 4 minutes Treadmill backward walking, 3% x 3 minutes @ 0.8 mph Fwd/lat Hurdles step over (3) x 4 laps each  Wall squats with ball 0-90 degrees, 2 x 10  Wall sit at end of each set, x20 sec hold  Lateral/reverse side lunges with slider, x 15 each  SL bridge x 10 each LE  Supine marches (90-90 variation), x 20 BIL Supine  (90-90) Hollow Hold, 4 x 15 sec    [facility] Adult PT Treatment:  DATE: 10/10/2022  Therapeutic Exercise: Recumbent bike, level 3 x 5 minutes Mini wall squats with ball 0-80 degrees, 2 x 10  Wall sit at end of each set, x15 sec hold  Mini Side lunges with slider, x 15 with UE support for stability  Left only SLR 4 way x 15  Bridge with ball squeeze, 2 x 15, 3 sec hold  SL bridge x 10 each LE  Supine marches (90-90 variation), x 20 BIL Supine (90-90) Hollow Hold, 3 x 15 sec  Seated clamshells with blue TB above knee, 2 x15 BIL  Consider increasing resistance with hip and core strengthening as tolerated.    [facility] Adult PT Treatment:                                                DATE: 10/03/2022  Therapeutic Exercise: Recumbent bike, level 3 x 5 minutes Mini wall squats with ball 0-80 degrees, 3 x 10  Wall sit at end of each set, x10 sec hold  Mini Side lunges with slider, 2 x 10 with UE support for stability  Left only SLR 4 way x 15  Bridge with ball squeeze, 2 x 15, 3 sec hold  SL bridge x 10 each LE  Supine marches (90-90 variation), x 15 BIL Supine (90-90) Hollow Hold, 3 x 10 sec  Seated clamshells with blue TB above knee, x 30 BIL Seated marches with blue TB above knee, x 30 each LE  [facility] Adult PT Treatment:                                                DATE: 09/27/2022  Therapeutic Exercise: Recumbent bike x 5 min  Quadruped hip mule kicks 3 x 10  R sidelying L hip abduction oscillations at mid range x30 Bridge with ball squeeze, x 25 Glute bridge on pball for CKC hip strengthening, 2 x 10 Supine marches (90-90 variation), 2 x 10 each side  Mini wall squats with ball 0-45 degrees, 3 x 10  Wall sit at end of each set, x12sec hold  Seated clamshells with green TB above knee, x 30 BIL Seated marches with green TB above knee, x 30 each LE Resisted Walking with cable column - fwd/lateral x 5 each                                                                                                                              PATIENT EDUCATION: Education details: Pt education on PT impairments, prognosis, and POC. Informed consent. Rationale for interventions, safe/appropriate HEP performance, safety w/ activity, icing  Person educated: Patient Education method: Explanation, Demonstration, Tactile cues, Verbal cues, and Handouts Education comprehension:  verbalized understanding, returned demonstration, verbal cues required, tactile cues required, and needs further education    HOME EXERCISE PROGRAM: Access Code: [email or web address] URL: [email or web address] Date: 10/04/2022 Prepared by: Mauri Reading  Exercises - Active Straight Leg Raise with Quad Set  - 1 x daily - 7 x weekly - 2 sets - 10-15 reps - Sidelying Hip Abduction  - 1 x daily - 7 x weekly - 2 sets - 10-15 reps - Prone Hip Extension  - 1 x daily - 7 x weekly - 2 sets - 10-15 reps - Sidelying Hip Adduction  - 1 x daily - 7 x weekly - 2 sets - 10-15 reps - Wall Quarter Squat with Swiss Ball  - 1 x daily - 7 x weekly - 2 sets - 10 reps - Single Leg Sit to Stand with Arms Crossed  - 1 x daily - 7 x weekly - 2 sets - 10 reps  ASSESSMENT:  CLINICAL IMPRESSION: Monifa was able to begin treadmill walking today including eccentric training without exacerbation of symptoms. She had some difficulty initially with standing toe raises, but reported decrease symptoms by end of repetitions. Plan is to assess R elbow at next visit per new referral from Dr. Everardo Pacific.     OBJECTIVE IMPAIRMENTS: Abnormal gait, decreased activity tolerance, decreased endurance, decreased mobility, difficulty walking, decreased ROM, decreased strength, increased edema, impaired sensation, improper body mechanics, postural dysfunction, and pain.   ACTIVITY LIMITATIONS: carrying, lifting, bending, standing, squatting, stairs, transfers, and locomotion level  PARTICIPATION LIMITATIONS: meal  prep, cleaning, laundry, community activity, and occupation  PERSONAL FACTORS: Past/current experiences and Time since onset of injury/illness/exacerbation are also affecting patient's functional outcome.   REHAB POTENTIAL: Good  CLINICAL DECISION MAKING: Evolving/moderate complexity  EVALUATION COMPLEXITY: Low   GOALS: Goals reviewed with patient? No  SHORT TERM GOALS: Target date: 10/15/2022 Pt will demonstrate appropriate understanding and performance of initially prescribed HEP in order to facilitate improved independence with management of symptoms.  Baseline: HEP provided on eval Goal status: INITIAL   2. Pt will score greater than or equal to 65 on FOTO in order to demonstrate improved perception of function due to symptoms.  Baseline: 63  Goal status: INITIAL    LONG TERM GOALS: Target date: 11/26/2022  Pt will score 68 or greater on FOTO in order to demonstrate improved perception of function due to symptoms.  Baseline: 63 Goal status: INITIAL  2.  Pt will demonstrate at least 0-120 degrees of left knee AROM in order to facilitate improved tolerance to functional movements such as walking, stair navigation, and transfers.  Baseline: see ROM chart above Goal status: INITIAL  3.  Pt will demonstrate grossly symmetrical knee extension strength via MMT or handheld dynamometer in order to facilitate improved functional kinematics and maximize outcomes. Baseline: deferred given surgical acuity Goal status: INITIAL  4.  Pt will be able to perform 5 consecutive sit to stands without UE support and grossly WNL mechanics to improve safety w/ transfers.  Baseline: UE support for transfers w/ reduced WB through surgical limb Goal status: INITIAL   5. Pt will report ability to stand/walk for up to 1hr in order to maximize tolerance to community navigation and household tasks.  Baseline:  altered mechanics, increased swelling with standing/walking  Goal status: INITIAL   6. Pt  will be able to safely navigate up to 5 stairs without UE support in order to facilitate improved independence with home access.   Baseline: altered mechanics, rail use  Goal  status: INITIAL   PLAN:  PT FREQUENCY: 2x/week  PT DURATION: 12 weeks  PLANNED INTERVENTIONS: Therapeutic exercises, Therapeutic activity, Neuromuscular re-education, Balance training, Gait training, Patient/Family education, Self Care, Joint mobilization, Stair training, Aquatic Therapy, Dry Needling, Electrical stimulation, Cryotherapy, Moist heat, scar mobilization, Taping, Manual therapy, and Re-evaluation  PLAN FOR NEXT SESSION: last updated 10/03/22 per surgeon's protocol -   Exercises: increase CC activities (0-90 deg), add pulley weights, therabands, etc; monitor for anterior knee pain symptoms. Add core strengthening exercises. Add side lunges and/or slideboard. Continue stationary bike and outdoor biking for ROM, strengthening and cardio. Swimming and water based controlled activities can begin. D/C to HEP if independent.    Mauri Reading, PT, DPT 10/11/22  4:49 PM

## 2022-10-17 ENCOUNTER — Ambulatory Visit: Payer: Medicaid Other

## 2022-10-18 ENCOUNTER — Ambulatory Visit: Payer: Medicaid Other

## 2022-10-23 ENCOUNTER — Other Ambulatory Visit (HOSPITAL_COMMUNITY): Payer: Self-pay

## 2022-10-24 ENCOUNTER — Ambulatory Visit: Payer: Medicaid Other

## 2022-10-24 ENCOUNTER — Other Ambulatory Visit (HOSPITAL_COMMUNITY): Payer: Self-pay

## 2022-10-24 DIAGNOSIS — M25662 Stiffness of left knee, not elsewhere classified: Secondary | ICD-10-CM

## 2022-10-24 DIAGNOSIS — R600 Localized edema: Secondary | ICD-10-CM

## 2022-10-24 DIAGNOSIS — M25521 Pain in right elbow: Secondary | ICD-10-CM

## 2022-10-24 DIAGNOSIS — M25562 Pain in left knee: Secondary | ICD-10-CM

## 2022-10-24 DIAGNOSIS — R2689 Other abnormalities of gait and mobility: Secondary | ICD-10-CM

## 2022-10-24 MED ORDER — SEMAGLUTIDE-WEIGHT MANAGEMENT 0.5 MG/0.5ML ~~LOC~~ SOAJ
0.5000 mg | SUBCUTANEOUS | 0 refills | Status: DC
  Filled 2022-10-24 – 2022-11-01 (×3): qty 2, 28d supply, fill #0

## 2022-10-24 NOTE — Therapy (Signed)
OUTPATIENT PHYSICAL THERAPY TREATMENT NOTE   Patient Name: Kathy White MRN: [id number] DOB:30-May-1980, 42 y.o., female Today's Date: 10/24/2022  END OF SESSION:         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   Hematuria 04/21/2013   History of abnormal cervical Pap smear    History of HPV infection    Hypertension    IBS (irritable bowel syndrome)    Left ACL tear    Migraine    OA (osteoarthritis) of knee    Obesity    Pain of joint of left ankle and foot 10/08/2019   PCO (polycystic ovaries)    Pelvic pain 09/16/2018   S/P ACL reconstruction 03/22/2015   Stiffness of left knee 09/25/2021   Vaginal Pap smear, abnormal    Vitamin D deficiency    Past Surgical History:  Procedure Laterality Date   BREAST BIOPSY  08/07/2022   MM RT RADIOACTIVE SEED LOC MAMMO GUIDE 08/07/2022 [facility] MAMMOGRAP[facility]   BREAST LUMPECTOMY WITH RADIOACTIVE SEED LOCALIZATION Right 08/08/2022   Procedure: RIGHT BREAST LUMPECTOMY WITH RADIOACTIVE SEED LOCALIZATION;  Surgeon: Griselda Miner, MD;  Location: [facility];  Service: General;  Laterality: Right;   CARPAL TUNNEL RELEASE Right 04/28/2021   CERVICAL BIOPSY  W/ LOOP ELECTRODE EXCISION     CESAREAN SECTION  11/14/2004   EXAM UNDER ANESTHESIA WITH MANIPULATION OF KNEE Left 03/22/2015   Procedure: LEFT KNEE EXAM UNDER ANESTHESIA  ;  Surgeon: Eugenia Mcalpine, MD;  Location: [facility] [place];  Service: Orthopedics;  Laterality: Left;   HAND WOUND EXPLORATION AND TENDON REPAIR'S  Right 10/12/2014   right ring and index fingers   KNEE ARTHROSCOPY W/ PARTIAL MEDIAL MENISCECTOMY Left 09/23/2021   KNEE ARTHROSCOPY WITH ANTERIOR CRUCIATE LIGAMENT (ACL) REPAIR WITH HAMSTRING GRAFT Left 03/22/2015   Procedure: LEFT KNEE ARTHROSCOPY WITH ANTERIOR CRUCIATE LIGAMENT (ACL) AUTOGRAFT RECONSTRUCTION;  Surgeon: Eugenia Mcalpine, MD;  Location: [facility] [place];  Service: Orthopedics;  Laterality: Left;  ANESTHESIA: GENERAL/ABDUCTOR CANAL BLOCK   KNEE SURGERY  09/2021   LAPAROSCOPIC CHOLECYSTECTOMY  12/20/2004   LAPAROSCOPIC GASTRIC BAND REMOVAL WITH LAPAROSCOPIC GASTRIC SLEEVE RESECTION  09/09/2017   MENISECTOMY Left 03/22/2015   Procedure: PARTIAL LEFT KNEE  LATERAL MENISECTOMY, CHONDROPLASTY;  Surgeon: Eugenia Mcalpine, MD;  Location: [facility] [place];  Service: Orthopedics;  Laterality: Left;   NERVE, TENDON AND ARTERY REPAIR Right 10/12/2014   Procedure: RIGHT RING FINGER AND RIGHT SMALL FINGER WOUND EXPLORATION WITH TENDON REPAIRS;  Surgeon: Bradly Bienenstock, MD;  Location: [facility];  Service: Orthopedics;  Laterality: Right;   SYNOVECTOMY Left 08/23/2022   Procedure: SYNOVECTOMY;  Surgeon: Bjorn Pippin, MD;  Location: [facility];  Service: Orthopedics;  Laterality: Left;   WISDOM TOOTH EXTRACTION     Patient Active Problem List   Diagnosis Date Noted   Genetic testing 08/14/2022   Family history of uterine cancer 08/02/2022   Family history of BRCA1 gene positive 08/02/2022   Dysuria 06/11/2022   Burning  with urination 05/22/2022   Urinary frequency 05/22/2022   Gastro-esophageal reflux disease without esophagitis 03/08/2022   Neuropathy 03/08/2022   Generalized abdominal pain 03/07/2022   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   Right facial numbness 01/11/2021   Carpal tunnel syndrome of right wrist 01/05/2021   Numbness of hand 01/05/2021   Mass of upper outer quadrant of right breast 07/21/2020   Pain of joint of left ankle and  foot 10/08/2019   Arthritis of both knees 10/02/2019   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   Prediabetes 05/30/2017   Vitamin D deficiency 05/30/2017   PCOS (polycystic ovarian syndrome) 01/11/2016   Fibroid 09/15/2015   S/P ACL reconstruction 03/22/2015   Hypertension 03/31/2013   IUD (intrauterine device) in place 02/26/2013   FH: breast cancer in first degree relative 02/26/2013   Leg edema 11/21/2012   Migraine 07/31/2012    PCP: Dois Davenport, MD  REFERRING PROVIDER: Vernetta Honey, PA-C   REFERRING DIAG: Left knee revision ACL reconstruction with allograft 08/23/22   THERAPY DIAG:  Left knee pain, unspecified chronicity  Pain in right elbow  Other abnormalities of gait and mobility  Localized edema  Stiffness of left knee, not elsewhere classified  Rationale for Evaluation and Treatment: Rehabilitation  ONSET DATE: 08/23/22 op date  SUBJECTIVE:   SUBJECTIVE STATEMENT: Patient reporting some increase in her left knee pain and L LE d/t not taking gabapentin over the last 3 days.   Patient reporting that she's had Rt tennis elbow for the past 4 months. "I think I overdid it with shoveling" [related to pet care]. She was given a brace that she states is helpful when it's flared up.   PERTINENT HISTORY: Migraines, prior L ACL repair, R carpal tunnel,    PAIN:  Are you having pain: no pain Location/description: L knee, 1/10 Best-worst over past week: denies any pain since POD1  - aggravating factors: being on feet for a while, heat - Easing factors: icing, medication    Initial rt elbow assessment 10/24/22:  Are you having pain: yes pain Location/description: Rt elbow 0-1/10 "It would get to 6/10 if I strained it by lifting something heavy."  - aggravating factors: lifting, overhead reaching, cooking ("mixing something"), pushing through R arm  - Easing factors: bracing  PRECAUTIONS: s/p R ACLR Delbert Harness Protocol, refer to media tab for  details)  Phase 1 0-6 weeks post op: WBAT, goal is to be without assist by POD10 Knee immobilizer for 1 week with sleep, may be weaned during ambulation once able to do SLR without lag ROM as tolerated, goals of full extension by week 2, 120 deg flexion by week 6  Exercises: extension board and prone hang with ankle weights up to 10lbs. Stationary bike with no resistance for knee flexion (alter set height as ROM increases). Patellar mobilization 5-10 min daily. Quad sets, SLR with knee locked in extension. Begin closed chain work (0-45 deg) when FWB. No restrictions on ankle and hip strengthening.   Phase 2 6-12 weeks post op:  Full WB, no brace  No ROM limitation, increase as tolerated  Ice as much as possible, after therapy at minimum  Exercises: increase CC activities (0-90 deg), add pulley weights, therabands, etc; monitor for anterior knee pain symptoms. Add core strengthening exercises. Add side lunges and/or slideboard. Continue stationary bike and outdoor biking for ROM, strengthening and cardio. Swimming and water based controlled activities can begin. D/C to HEP if independent.    WEIGHT BEARING  RESTRICTIONS: No  FALLS:  Has patient fallen in last 6 months? 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 Mild bruising about lateral incision. No erythema or excessive warmth  PALPATION: No TTP quad or surgical site, patellar mobility is confounded by edema but appears grossly WNL, no pain  UE AROM - R elbow WNL all directions  Minimal pain with end range PROM elbow flexion   UE MMT - R elbow WNL all directions   Palpation:  Tenderness to palpation at distal tricep insertion, lateral elbow   LOWER EXTREMITY ROM:     Active  Right eval Left eval Left 09/04/2022 Left 09/20/2022  Hip flexion      Hip extension      Hip internal rotation      Hip external rotation      Knee extension 0 deg Lacking 5 deg 3 1  Knee flexion 130 deg 80 deg (Able to achieve 90 deg post HEP) 118 122  (Blank rows = not tested) (Key: WFL = within functional limits not formally assessed, * = concordant pain, s = stiffness/stretching sensation, NT = not tested)  Comments: above is painless  LOWER EXTREMITY MMT:    MMT Right eval Left eval  Hip flexion    Hip abduction (modified sitting)    Hip internal rotation    Hip external rotation    Knee flexion    Knee extension    Ankle dorsiflexion     (Blank rows = not tested) (Key: WFL = within functional limits not formally assessed, * = concordant pain, s = stiffness/stretching sensation, NT = not tested)  Comments: deferred on eval given recency of surgery  LOWER EXTREMITY SPECIAL TESTS:  Deferred given recency of surgery  FUNCTIONAL TESTS:  Sit to stand transfer: weight shift towards R, gentle UE support, mild anterior placement of LLE    GAIT: Distance walked: within clinic Assistive device utilized: None Level of assistance: Complete Independence Comments: antalgic gait LLE, reduced stance time and mildly increased trunk sway    TODAY'S TREATMENT:    [facility] Adult PT Treatment:                                                DATE: 10/24/2022  Reassessment:  See objective measures and assessment   Manual  Therapy: Mobilization with movement elbow flexion/extension, pronation/supination Distal glides Grade I/II   [facility] Adult PT Treatment:  DATE: 10/11/2022  Therapeutic Exercise: Treadmill forward, 2% incline with self-selected pace x 4 minutes Treadmill backward walking, 3% x 3 minutes @ 0.8 mph Fwd/lat Hurdles step over (3) x 4 laps each  Wall squats with ball 0-90 degrees, 2 x 10  Wall sit at end of each set, x20 sec hold  Lateral/reverse side lunges with slider, x 15 each  SL bridge x 10 each LE  Supine marches (90-90 variation), x 20 BIL Supine (90-90) Hollow Hold, 4 x 15 sec    [facility] Adult PT Treatment:                                                DATE: 10/10/2022  Therapeutic Exercise: Recumbent bike, level 3 x 5 minutes Mini wall squats with ball 0-80 degrees, 2 x 10  Wall sit at end of each set, x15 sec hold  Mini Side lunges with slider, x 15 with UE support for stability  Left only SLR 4 way x 15  Bridge with ball squeeze, 2 x 15, 3 sec hold  SL bridge x 10 each LE  Supine marches (90-90 variation), x 20 BIL Supine (90-90) Hollow Hold, 3 x 15 sec  Seated clamshells with blue TB above knee, 2 x15 BIL  Consider increasing resistance with hip and core strengthening as tolerated.                                                                                                                              PATIENT EDUCATION: Education details: Pt education on PT impairments, prognosis, and POC. Informed consent. Rationale for interventions, safe/appropriate HEP performance, safety w/ activity, icing  Person educated: Patient Education method: Explanation, Demonstration, Tactile cues, Verbal cues, and Handouts Education comprehension: verbalized understanding, returned demonstration, verbal cues required, tactile cues required, and needs further education    HOME EXERCISE PROGRAM: Access Code: [email or web address] URL:  [email or web address] Date: 10/24/2022 Prepared by: Mauri Reading  Exercises - Active Straight Leg Raise with Quad Set  - 1 x daily - 7 x weekly - 2 sets - 10-15 reps - Sidelying Hip Abduction  - 1 x daily - 7 x weekly - 2 sets - 10-15 reps - Prone Hip Extension  - 1 x daily - 7 x weekly - 2 sets - 10-15 reps - Sidelying Hip Adduction  - 1 x daily - 7 x weekly - 2 sets - 10-15 reps - Wall Quarter Squat with Swiss Ball  - 1 x daily - 7 x weekly - 2 sets - 10 reps - Single Leg Sit to Stand with Arms Crossed  - 1 x daily - 7 x weekly - 2 sets - 10 reps - Seated Isometric Elbow Flexion  - 1 x daily - 7 x weekly - 1 sets - 10 reps - 5 sec hold -  Seated Isometric Elbow Extension  - 1 x daily - 7 x weekly - 1 sets - 10 reps - 5 sec hold  ASSESSMENT:  CLINICAL IMPRESSION: Kathy White presents to PT with new referral regarding Rt elbow pain. Current presentation is consistent with persistent lateral epicondylitis. She responded well to manual therapy today and updated HEP to include elbow isometrics as tolerated. She continues to report minimal-to-no knee pain and is progressing with her post-op PT well. We will continue with skilled PT intervention as appropriate for management of persistent Rt elbow pain and progression within post-op protocol for Lt knee.     OBJECTIVE IMPAIRMENTS: Abnormal gait, decreased activity tolerance, decreased endurance, decreased mobility, difficulty walking, decreased ROM, decreased strength, increased edema, impaired sensation, improper body mechanics, postural dysfunction, and pain.   ACTIVITY LIMITATIONS: carrying, lifting, bending, standing, squatting, stairs, transfers, and locomotion level  PARTICIPATION LIMITATIONS: meal prep, cleaning, laundry, community activity, and occupation  PERSONAL FACTORS: Past/current experiences and Time since onset of injury/illness/exacerbation are also affecting patient's functional outcome.   REHAB POTENTIAL:  Good  CLINICAL DECISION MAKING: Evolving/moderate complexity  EVALUATION COMPLEXITY: Low   GOALS: Goals reviewed with patient? No  SHORT TERM GOALS: Target date: 10/15/2022 Pt will demonstrate appropriate understanding and performance of initially prescribed HEP in order to facilitate improved independence with management of symptoms.  Baseline: HEP provided on eval Goal status: MET  2. Pt will score greater than or equal to 65 on FOTO in order to demonstrate improved perception of function due to symptoms.  Baseline: 63  Goal status: ONGOING  LONG TERM GOALS: Target date: 11/26/2022  Pt will score 68 or greater on FOTO in order to demonstrate improved perception of function due to symptoms.  Baseline: 63 Goal status: ONGOING  2.  Pt will demonstrate at least 0-120 degrees of left knee AROM in order to facilitate improved tolerance to functional movements such as walking, stair navigation, and transfers.  Baseline: see ROM chart above Goal status: ONGOING  3.  Pt will demonstrate grossly symmetrical knee extension strength via MMT or handheld dynamometer in order to facilitate improved functional kinematics and maximize outcomes. Baseline: deferred given surgical acuity Goal status: ONGOING  4.  Pt will be able to perform 5 consecutive sit to stands without UE support and grossly WNL mechanics to improve safety w/ transfers.  Baseline: UE support for transfers w/ reduced WB through surgical limb Goal status: MET  5. Pt will report ability to stand/walk for up to 1hr in order to maximize tolerance to community navigation and household tasks.  Baseline:  altered mechanics, increased swelling with standing/walking  Goal status: NEARLY MET   6. Pt will be able to safely navigate up to 5 stairs without UE support in order to facilitate improved independence with home access.   Baseline: altered mechanics, rail use  Goal status: ONGOING  7. Pt will have decreased QuickDASH score  by at least 12%.  Baseline (10/24/22): 22.7 / 100 = 22.7 % Goal Status: INITIAL   PLAN:  PT FREQUENCY: 2x/week  PT DURATION: 4 weeks as of 10/24/22  PLANNED INTERVENTIONS: Therapeutic exercises, Therapeutic activity, Neuromuscular re-education, Balance training, Gait training, Patient/Family education, Self Care, Joint mobilization, Stair training, Aquatic Therapy, Dry Needling, Electrical stimulation, Cryotherapy, Moist heat, scar mobilization, Taping, Manual therapy, and Re-evaluation  PLAN FOR NEXT SESSION: last updated 10/03/22 per surgeon's protocol -   Exercises: increase CC activities (0-90 deg), add pulley weights, therabands, etc; monitor for anterior knee pain symptoms. Add core strengthening  exercises. Add side lunges and/or slideboard. Continue stationary bike and outdoor biking for ROM, strengthening and cardio. Swimming and water based controlled activities can begin. D/C to HEP if independent.    Mauri Reading, PT, DPT 10/26/22  9:56 AM

## 2022-10-31 ENCOUNTER — Ambulatory Visit: Payer: Medicaid Other | Admitting: Physical Therapy

## 2022-10-31 ENCOUNTER — Encounter: Payer: Self-pay | Admitting: Physical Therapy

## 2022-10-31 ENCOUNTER — Other Ambulatory Visit (HOSPITAL_COMMUNITY): Payer: Self-pay

## 2022-10-31 DIAGNOSIS — R2689 Other abnormalities of gait and mobility: Secondary | ICD-10-CM

## 2022-10-31 DIAGNOSIS — M25562 Pain in left knee: Secondary | ICD-10-CM

## 2022-10-31 DIAGNOSIS — M25521 Pain in right elbow: Secondary | ICD-10-CM

## 2022-10-31 DIAGNOSIS — R600 Localized edema: Secondary | ICD-10-CM

## 2022-10-31 NOTE — Therapy (Signed)
OUTPATIENT PHYSICAL THERAPY TREATMENT NOTE   Patient Name: Kathy White MRN: [id number] DOB:1980-11-04, 42 y.o., female Today's Date: 10/24/2022  END OF SESSION:  PT End of Session - 10/31/22 1529     Visit Number 13    Number of Visits 25    Date for PT Re-Evaluation 10/29/22    Authorization Type [place] medicaid healthy blue    Authorization Time Period 09/04/2022 -12/02/2022    Authorization - Number of Visits 12    PT Start Time 0330    PT Stop Time 0411    PT Time Calculation (min) 41 min    Activity Tolerance Patient tolerated treatment well;No increased pain    Behavior During Therapy [facility] for tasks assessed/performed              PT End of Session - 10/31/22 1529     Visit Number 13    Number of Visits 25    Date for PT Re-Evaluation 10/29/22    Authorization Type [place] medicaid healthy blue    Authorization Time Period 09/04/2022 -12/02/2022    Authorization - Number of Visits 12    PT Start Time 0330    PT Stop Time 0411    PT Time Calculation (min) 41 min    Activity Tolerance Patient tolerated treatment well;No increased pain    Behavior During Therapy [facility] for tasks assessed/performed                 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   Hematuria 04/21/2013   History of abnormal cervical Pap smear    History of HPV infection    Hypertension    IBS (irritable bowel syndrome)    Left ACL tear    Migraine    OA (osteoarthritis) of knee    Obesity    Pain of joint of left ankle and foot 10/08/2019   PCO  (polycystic ovaries)    Pelvic pain 09/16/2018   S/P ACL reconstruction 03/22/2015   Stiffness of left knee 09/25/2021   Vaginal Pap smear, abnormal    Vitamin D deficiency    Past Surgical History:  Procedure Laterality Date   BREAST BIOPSY  08/07/2022   MM RT RADIOACTIVE SEED LOC MAMMO GUIDE 08/07/2022 [facility] MAMMOGRAPHY   BREAST LUMPECTOMY WITH RADIOACTIVE SEED LOCALIZATION Right 08/08/2022   Procedure: RIGHT BREAST LUMPECTOMY WITH RADIOACTIVE SEED LOCALIZATION;  Surgeon: Griselda Miner, MD;  Location: [facility];  Service: General;  Laterality: Right;   CARPAL TUNNEL RELEASE Right 04/28/2021   CERVICAL BIOPSY  W/ LOOP ELECTRODE EXCISION     CESAREAN SECTION  11/14/2004   EXAM UNDER ANESTHESIA WITH MANIPULATION OF KNEE Left 03/22/2015   Procedure: LEFT KNEE EXAM UNDER ANESTHESIA  ;  Surgeon: Eugenia Mcalpine, MD;  Location: [facility];  Service: Orthopedics;  Laterality: Left;   HAND WOUND EXPLORATION AND TENDON REPAIR'S Right 10/12/2014   right ring and index fingers   KNEE ARTHROSCOPY W/ PARTIAL MEDIAL MENISCECTOMY Left 09/23/2021  KNEE ARTHROSCOPY WITH ANTERIOR CRUCIATE LIGAMENT (ACL) REPAIR WITH HAMSTRING GRAFT Left 03/22/2015   Procedure: LEFT KNEE ARTHROSCOPY WITH ANTERIOR CRUCIATE LIGAMENT (ACL) AUTOGRAFT RECONSTRUCTION;  Surgeon: Eugenia Mcalpine, MD;  Location: [facility];  Service: Orthopedics;  Laterality: Left;  ANESTHESIA: GENERAL/ABDUCTOR CANAL BLOCK   KNEE SURGERY  09/2021   LAPAROSCOPIC CHOLECYSTECTOMY  12/20/2004   LAPAROSCOPIC GASTRIC BAND REMOVAL WITH LAPAROSCOPIC GASTRIC SLEEVE RESECTION  09/09/2017   MENISECTOMY Left 03/22/2015   Procedure: PARTIAL LEFT KNEE  LATERAL MENISECTOMY, CHONDROPLASTY;  Surgeon: Eugenia Mcalpine, MD;  Location: [facility];  Service: Orthopedics;  Laterality: Left;   NERVE, TENDON AND ARTERY REPAIR Right 10/12/2014   Procedure: RIGHT RING FINGER AND RIGHT SMALL FINGER WOUND EXPLORATION  WITH TENDON REPAIRS;  Surgeon: Bradly Bienenstock, MD;  Location: [facility];  Service: Orthopedics;  Laterality: Right;   SYNOVECTOMY Left 08/23/2022   Procedure: SYNOVECTOMY;  Surgeon: Bjorn Pippin, MD;  Location: [facility];  Service: Orthopedics;  Laterality: Left;   WISDOM TOOTH EXTRACTION     Patient Active Problem List   Diagnosis Date Noted   Genetic testing 08/14/2022   Family history of uterine cancer 08/02/2022   Family history of BRCA1 gene positive 08/02/2022   Dysuria 06/11/2022   Burning with urination 05/22/2022   Urinary frequency 05/22/2022   Gastro-esophageal reflux disease without esophagitis 03/08/2022   Neuropathy 03/08/2022   Generalized abdominal pain 03/07/2022   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   Right facial numbness 01/11/2021   Carpal tunnel syndrome of right wrist 01/05/2021   Numbness of hand 01/05/2021   Mass of upper outer quadrant of right breast 07/21/2020   Pain of joint of left ankle and foot 10/08/2019   Arthritis of both knees 10/02/2019   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   Prediabetes 05/30/2017   Vitamin D deficiency 05/30/2017   PCOS (polycystic ovarian syndrome) 01/11/2016   Fibroid 09/15/2015   S/P ACL reconstruction 03/22/2015   Hypertension 03/31/2013   IUD (intrauterine device) in place 02/26/2013   FH: breast cancer in first degree relative 02/26/2013   Leg edema 11/21/2012   Migraine 07/31/2012    PCP: Dois Davenport, MD  REFERRING PROVIDER: Vernetta Honey, PA-C   REFERRING DIAG: Left knee revision ACL reconstruction with allograft 08/23/22   THERAPY DIAG:  Left knee pain, unspecified chronicity  Pain in right elbow  Other abnormalities of gait and mobility  Localized edema  Rationale for Evaluation and Treatment: Rehabilitation  ONSET DATE: 08/23/22 op date  SUBJECTIVE:   SUBJECTIVE STATEMENT: Pt reports that she had a fall on Sunday.  She slipped on a wet spot on her  floor.  She landed on her R wrist and L knee.  Her knee was initially painful, but this has somewhat subsided.  She initially felt that the knee was weak, but this has now improved. Her lateral L knee hurts @ 1/10 today.  PERTINENT HISTORY: Migraines, prior L ACL repair, R carpal tunnel,    PAIN:  Are you having pain: no pain Location/description: L knee, 1/10 Best-worst over past week: denies any pain since POD1  - aggravating factors: being on feet for a while, heat - Easing factors: icing, medication    Initial rt elbow assessment 10/24/22:  Are you having pain: yes pain Location/description: Rt elbow 0-1/10 "It would get to 6/10 if I strained it by lifting something heavy."  - aggravating factors: lifting, overhead reaching, cooking ("mixing something"), pushing through R arm  -  Easing factors: bracing  PRECAUTIONS: s/p R ACLR [facility]ness Protocol, refer to media tab for details)  Phase 1 0-6 weeks post op: WBAT, goal is to be without assist by POD10 Knee immobilizer for 1 week with sleep, may be weaned during ambulation once able to do SLR without lag ROM as tolerated, goals of full extension by week 2, 120 deg flexion by week 6  Exercises: extension board and prone hang with ankle weights up to 10lbs. Stationary bike with no resistance for knee flexion (alter set height as ROM increases). Patellar mobilization 5-10 min daily. Quad sets, SLR with knee locked in extension. Begin closed chain work (0-45 deg) when FWB. No restrictions on ankle and hip strengthening.   Phase 2 6-12 weeks post op:  Full WB, no brace  No ROM limitation, increase as tolerated  Ice as much as possible, after therapy at minimum  Exercises: increase CC activities (0-90 deg), add pulley weights, therabands, etc; monitor for anterior knee pain symptoms. Add core strengthening exercises. Add side lunges and/or slideboard. Continue stationary bike and outdoor biking for ROM, strengthening and cardio. Swimming  and water based controlled activities can begin. D/C to HEP if independent.    WEIGHT BEARING RESTRICTIONS: No  FALLS:  Has patient fallen in last 6 months? 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 Mild bruising about lateral incision. No erythema or excessive warmth  PALPATION: No TTP quad or surgical site, patellar mobility is confounded by edema but appears grossly WNL, no pain  UE AROM - R elbow WNL all directions  Minimal pain with end range PROM elbow flexion   UE MMT - R elbow WNL all directions   Palpation:  Tenderness to palpation at distal tricep insertion, lateral elbow   LOWER EXTREMITY ROM:     Active  Right eval Left eval Left 09/04/2022 Left 09/20/2022  Hip flexion      Hip extension      Hip internal rotation      Hip external rotation      Knee extension 0 deg Lacking 5 deg 3 1  Knee flexion 130 deg 80 deg (Able to achieve 90 deg post HEP) 118 122  (Blank rows = not tested) (Key: WFL = within functional limits not formally assessed, * = concordant pain, s = stiffness/stretching sensation, NT = not tested)  Comments: above is painless  LOWER EXTREMITY MMT:    MMT Right eval Left eval  Hip flexion    Hip abduction (modified sitting)    Hip  internal rotation    Hip external rotation    Knee flexion    Knee extension    Ankle dorsiflexion     (Blank rows = not tested) (Key: WFL = within functional limits not formally assessed, * =  concordant pain, s = stiffness/stretching sensation, NT = not tested)  Comments: deferred on eval given recency of surgery  LOWER EXTREMITY SPECIAL TESTS:  Deferred given recency of surgery  FUNCTIONAL TESTS:  Sit to stand transfer: weight shift towards R, gentle UE support, mild anterior placement of LLE    GAIT: Distance walked: within clinic Assistive device utilized: None Level of assistance: Complete Independence Comments: antalgic gait LLE, reduced stance time and mildly increased trunk sway    TODAY'S TREATMENT:    [facility] Adult PT Treatment:                                                DATE: 10/31/2022  Therapeutic Exercise: Treadmill forward, 2% incline with self-selected pace x 4 minutes Treadmill backward walking, 3% x 3 minutes @ 0.8 mph Fwd/lat Hurdles step over (3) x 4 laps each  Wall squats with ball 0-90 degrees, 2 x 10  Wall sit at end of each set, x20 sec hold  Lateral/reverse side lunges with slider, x 15 each  Bridge - 2x10 SL bridge 2x 10 each LE  Supine marches (90-90 variation), x 20 BIL Supine (90-90) Hollow Hold, 4 x 20 sec    [facility] Adult PT Treatment:                                                DATE: 10/24/2022  Reassessment:  See objective measures and assessment   Manual Therapy: Mobilization with movement elbow flexion/extension, pronation/supination Distal glides Grade I/II   [facility] Adult PT Treatment:                                                DATE: 10/11/2022  Therapeutic Exercise: Treadmill forward, 2% incline with self-selected pace x 4 minutes Treadmill backward walking, 3% x 3 minutes @ 0.8 mph Fwd/lat Hurdles step over (3) x 4 laps each  Wall squats with ball 0-90 degrees, 2 x 10  Wall sit at end of each set, x20 sec hold   Lateral/reverse side lunges with slider, x 15 each  SL bridge x 10 each LE  Supine marches (90-90 variation), x 20 BIL Supine (90-90) Hollow Hold, 4 x 15 sec    [facility] Adult PT Treatment:                                                DATE: 10/10/2022  Therapeutic Exercise: Recumbent bike, level 3 x 5 minutes Mini wall squats with ball 0-80 degrees, 2 x 10  Wall sit at end of each set, x15 sec hold  Mini Side lunges with slider, x 15 with UE support for stability  Left only SLR 4 way x 15  Bridge with ball squeeze, 2 x 15, 3 sec hold  SL bridge x 10 each LE  Supine marches (90-90 variation), x 20 BIL Supine (90-90) Hollow Hold, 3 x 15 sec  Seated clamshells with blue TB above knee, 2 x15 BIL  Consider increasing resistance with hip and core strengthening as tolerated.                                                                                                                              PATIENT EDUCATION: Education details: Pt education on PT impairments, prognosis, and POC. Informed consent. Rationale for interventions, safe/appropriate HEP performance, safety w/ activity, icing  Person educated: Patient Education method: Explanation, Demonstration, Tactile cues, Verbal cues, and Handouts Education comprehension: verbalized understanding, returned demonstration, verbal cues required, tactile cues required, and needs further education    HOME EXERCISE PROGRAM: Access Code: [email or web address] URL: [email or web address] Date: 10/24/2022 Prepared by: Mauri Reading  Exercises - Active Straight Leg Raise with Quad Set  - 1 x daily - 7 x weekly - 2 sets - 10-15 reps - Sidelying Hip Abduction  - 1 x daily - 7 x weekly - 2 sets - 10-15 reps - Prone Hip Extension  - 1 x daily - 7 x weekly - 2 sets - 10-15 reps - Sidelying Hip Adduction  - 1 x daily - 7 x weekly - 2 sets - 10-15 reps - Wall Quarter Squat with Swiss Ball  - 1 x daily - 7 x weekly - 2 sets - 10 reps - Single  Leg Sit to Stand with Arms Crossed  - 1 x daily - 7 x weekly - 2 sets - 10 reps - Seated Isometric Elbow Flexion  - 1 x daily - 7 x weekly - 1 sets - 10 reps - 5 sec hold - Seated Isometric Elbow Extension  - 1 x daily - 7 x weekly - 1 sets - 10 reps - 5 sec hold  ASSESSMENT:  CLINICAL IMPRESSION: Cella tolerated session well.  She had a recent fall so intensity was kept roughly the same.  She was able to tolerate listed exercises with no more than a 1 pt increase in pain which dissipated with rest.  No signs of instability or significant trauma following fall.  She reports fatigue following session.    OBJECTIVE IMPAIRMENTS: Abnormal gait, decreased activity tolerance, decreased endurance, decreased mobility, difficulty walking, decreased ROM, decreased strength, increased edema, impaired sensation, improper body mechanics, postural dysfunction, and pain.   ACTIVITY LIMITATIONS: carrying, lifting, bending, standing, squatting, stairs, transfers, and locomotion level  PARTICIPATION LIMITATIONS: meal prep, cleaning, laundry, community activity, and occupation  PERSONAL FACTORS: Past/current experiences and Time since onset of injury/illness/exacerbation are also affecting patient's functional outcome.   REHAB POTENTIAL: Good  CLINICAL DECISION MAKING: Evolving/moderate complexity  EVALUATION COMPLEXITY: Low   GOALS: Goals reviewed with patient? No  SHORT TERM GOALS: Target date: 10/15/2022 Pt will demonstrate appropriate understanding and performance of initially prescribed HEP in order to facilitate improved independence with management of symptoms.  Baseline: HEP provided on eval Goal status: MET  2. Pt will score greater than or equal to 65 on FOTO in order to demonstrate improved perception of function due to symptoms.  Baseline: 63  Goal status: ONGOING  LONG TERM GOALS: Target date: 11/26/2022  Pt will score 68 or greater on FOTO in order to demonstrate improved perception  of function due to symptoms.  Baseline: 63 Goal status: ONGOING  2.  Pt will demonstrate at least 0-120 degrees of left knee AROM in order to facilitate improved tolerance to functional movements such as walking, stair navigation, and transfers.  Baseline: see ROM chart above Goal status: ONGOING  3.  Pt will demonstrate grossly symmetrical knee extension strength via MMT or handheld dynamometer in order to facilitate improved functional kinematics and maximize outcomes. Baseline: deferred given surgical acuity Goal status: ONGOING  4.  Pt will be able to perform 5 consecutive sit to stands without UE support and grossly WNL mechanics to improve safety w/ transfers.  Baseline: UE support for transfers w/ reduced WB through surgical limb Goal status: MET  5. Pt will report ability to stand/walk for up to 1hr in order to maximize tolerance to community navigation and household tasks.  Baseline:  altered mechanics, increased swelling with standing/walking  Goal status: NEARLY MET   6. Pt will be able to safely navigate up to 5 stairs without UE support in order to facilitate improved independence with home access.   Baseline: altered mechanics, rail use  Goal status: ONGOING  7. Pt will have decreased QuickDASH score by at least 12%.  Baseline (10/24/22): 22.7 / 100 = 22.7 % Goal Status: INITIAL   PLAN:  PT FREQUENCY: 2x/week  PT DURATION: 4 weeks as of 10/24/22  PLANNED INTERVENTIONS: Therapeutic exercises, Therapeutic activity, Neuromuscular re-education, Balance training, Gait training, Patient/Family education, Self Care, Joint mobilization, Stair training, Aquatic Therapy, Dry Needling, Electrical stimulation, Cryotherapy, Moist heat, scar mobilization, Taping, Manual therapy, and Re-evaluation  PLAN FOR NEXT SESSION: last updated 10/03/22 per surgeon's protocol -   Exercises: increase CC activities (0-90 deg), add pulley weights, therabands, etc; monitor for anterior knee pain  symptoms. Add core strengthening exercises. Add side lunges and/or slideboard. Continue stationary bike and outdoor biking for ROM, strengthening and cardio. Swimming and water based controlled activities can begin. D/C to HEP if independent.    Kimberlee Nearing Ishi Danser PT 10/31/22  4:12 PM

## 2022-11-01 ENCOUNTER — Other Ambulatory Visit (HOSPITAL_COMMUNITY): Payer: Self-pay

## 2022-11-06 ENCOUNTER — Ambulatory Visit: Payer: Medicaid Other | Admitting: Physical Therapy

## 2022-11-07 ENCOUNTER — Ambulatory Visit: Payer: Medicaid Other

## 2022-11-20 ENCOUNTER — Ambulatory Visit: Payer: Medicaid Other | Attending: Physician Assistant

## 2022-11-20 DIAGNOSIS — M25662 Stiffness of left knee, not elsewhere classified: Secondary | ICD-10-CM | POA: Diagnosis present

## 2022-11-20 DIAGNOSIS — R600 Localized edema: Secondary | ICD-10-CM | POA: Insufficient documentation

## 2022-11-20 DIAGNOSIS — M25562 Pain in left knee: Secondary | ICD-10-CM | POA: Diagnosis present

## 2022-11-20 DIAGNOSIS — M25521 Pain in right elbow: Secondary | ICD-10-CM | POA: Insufficient documentation

## 2022-11-20 DIAGNOSIS — R2689 Other abnormalities of gait and mobility: Secondary | ICD-10-CM | POA: Diagnosis present

## 2022-11-20 NOTE — Therapy (Signed)
OUTPATIENT PHYSICAL THERAPY TREATMENT NOTE   Patient Name: Kathy White MRN: [id number] DOB:05-Sep-1980, 42 y.o., female Today's Date: 10/24/2022  END OF SESSION:    PT End of Session - 11/20/22 1520     Visit Number 14    Number of Visits 25    Date for PT Re-Evaluation 11/26/22    Authorization Type [place] medicaid healthy blue    Authorization Time Period 09/04/2022 -12/02/2022    Authorization - Visit Number 12    PT Start Time 1530    PT Stop Time 1608    PT Time Calculation (min) 38 min    Activity Tolerance Patient tolerated treatment well;No increased pain    Behavior During Therapy [facility] for tasks assessed/performed               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   Hematuria 04/21/2013   History of abnormal cervical Pap smear    History of HPV infection    Hypertension    IBS (irritable bowel syndrome)    Left ACL tear    Migraine    OA (osteoarthritis) of knee    Obesity    Pain of joint of left ankle and foot 10/08/2019   PCO (polycystic ovaries)    Pelvic pain 09/16/2018   S/P ACL reconstruction 03/22/2015   Stiffness of left knee 09/25/2021   Vaginal Pap smear, abnormal    Vitamin D deficiency    Past Surgical History:  Procedure Laterality Date   BREAST BIOPSY  08/07/2022   MM RT RADIOACTIVE SEED LOC MAMMO GUIDE 08/07/2022 [facility] MAMMOGRAPHY   BREAST LUMPECTOMY WITH RADIOACTIVE SEED LOCALIZATION Right 08/08/2022   Procedure: RIGHT BREAST LUMPECTOMY WITH RADIOACTIVE SEED LOCALIZATION;  Surgeon: Griselda Miner, MD;  Location: [facility];  Service: General;  Laterality: Right;   CARPAL TUNNEL RELEASE Right 04/28/2021   CERVICAL BIOPSY  W/ LOOP ELECTRODE EXCISION     CESAREAN SECTION  11/14/2004   EXAM UNDER ANESTHESIA WITH MANIPULATION OF KNEE Left 03/22/2015   Procedure: LEFT KNEE EXAM UNDER ANESTHESIA  ;  Surgeon: Eugenia Mcalpine, MD;  Location: [facility];  Service: Orthopedics;  Laterality: Left;   HAND WOUND EXPLORATION AND TENDON REPAIR'S Right 10/12/2014   right ring and index fingers   KNEE ARTHROSCOPY W/ PARTIAL MEDIAL MENISCECTOMY Left 09/23/2021   KNEE ARTHROSCOPY WITH ANTERIOR CRUCIATE LIGAMENT (ACL) REPAIR WITH HAMSTRING GRAFT Left 03/22/2015   Procedure: LEFT KNEE ARTHROSCOPY WITH ANTERIOR CRUCIATE LIGAMENT (ACL) AUTOGRAFT RECONSTRUCTION;  Surgeon: Eugenia Mcalpine, MD;  Location: [facility] [place];  Service: Orthopedics;  Laterality: Left;  ANESTHESIA: GENERAL/ABDUCTOR CANAL BLOCK   KNEE SURGERY  09/2021   LAPAROSCOPIC CHOLECYSTECTOMY  12/20/2004   LAPAROSCOPIC GASTRIC BAND REMOVAL WITH LAPAROSCOPIC GASTRIC SLEEVE RESECTION  09/09/2017   MENISECTOMY Left 03/22/2015   Procedure: PARTIAL LEFT KNEE  LATERAL MENISECTOMY, CHONDROPLASTY;  Surgeon: Eugenia Mcalpine, MD;  Location: [facility] [place];  Service: Orthopedics;  Laterality: Left;   NERVE, TENDON AND ARTERY  REPAIR Right 10/12/2014   Procedure: RIGHT RING FINGER AND RIGHT SMALL FINGER WOUND EXPLORATION WITH TENDON REPAIRS;  Surgeon: Bradly Bienenstock, MD;  Location: [facility];  Service: Orthopedics;  Laterality: Right;   SYNOVECTOMY Left 08/23/2022   Procedure: SYNOVECTOMY;  Surgeon: Bjorn Pippin, MD;  Location: [facility];  Service: Orthopedics;  Laterality: Left;   WISDOM TOOTH EXTRACTION     Patient Active Problem List   Diagnosis Date Noted   Genetic testing 08/14/2022   Family history of uterine cancer 08/02/2022   Family history of BRCA1 gene positive 08/02/2022    Dysuria 06/11/2022   Burning with urination 05/22/2022   Urinary frequency 05/22/2022   Gastro-esophageal reflux disease without esophagitis 03/08/2022   Neuropathy 03/08/2022   Generalized abdominal pain 03/07/2022   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   Right facial numbness 01/11/2021   Carpal tunnel syndrome of right wrist 01/05/2021   Numbness of hand 01/05/2021   Mass of upper outer quadrant of right breast 07/21/2020   Pain of joint of left ankle and foot 10/08/2019   Arthritis of both knees 10/02/2019   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   Prediabetes 05/30/2017   Vitamin D deficiency 05/30/2017   PCOS (polycystic ovarian syndrome) 01/11/2016   Fibroid 09/15/2015   S/P ACL reconstruction 03/22/2015   Hypertension 03/31/2013   IUD (intrauterine device) in place 02/26/2013   FH: breast cancer in first degree relative 02/26/2013   Leg edema 11/21/2012   Migraine 07/31/2012    PCP: Dois Davenport, MD  REFERRING PROVIDER: Vernetta Honey, PA-C   REFERRING DIAG: Left knee revision ACL reconstruction with allograft 08/23/22  Right Tennis Elbow   THERAPY DIAG:  Left knee pain, unspecified chronicity  Pain in right elbow  Other abnormalities of gait and mobility  Localized edema  Stiffness of left knee, not elsewhere classified  Rationale for Evaluation and Treatment: Rehabilitation  ONSET DATE: 08/23/22 op date  SUBJECTIVE:   SUBJECTIVE STATEMENT: Patient reports not much pain today and no new falls since last session.    PERTINENT HISTORY: Migraines, prior L ACL repair, R carpal tunnel,    PAIN:  Are you having pain: no pain Location/description: L knee, 1/10 Best-worst over past week: denies any pain since POD1  - aggravating factors: being on feet for a while, heat - Easing factors: icing, medication    Initial rt elbow assessment 10/24/22:  Are you having pain: yes pain Location/description: Rt elbow 0-1/10 "It would  get to 6/10 if I strained it by lifting something heavy."  - aggravating factors: lifting, overhead reaching, cooking ("mixing something"), pushing through R arm  - Easing factors: bracing  PRECAUTIONS: s/p R ACLR Delbert Harness Protocol, refer to media tab for details)  Phase 1 0-6 weeks post op: WBAT, goal is to be without assist by POD10 Knee immobilizer for 1 week with sleep, may be weaned during ambulation once able to do SLR without lag ROM as tolerated, goals of full extension by week 2, 120 deg flexion by week 6  Exercises: extension board and prone hang with ankle weights up to 10lbs. Stationary bike with no resistance for knee flexion (alter set height as ROM increases). Patellar mobilization 5-10 min daily. Quad sets, SLR with knee locked in extension. Begin closed chain work (0-45 deg) when FWB. No restrictions on ankle and hip strengthening.   Phase 2 6-12 weeks post op:  Full WB, no brace  No ROM limitation, increase as tolerated  Ice  as much as possible, after therapy at minimum  Exercises: increase CC activities (0-90 deg), add pulley weights, therabands, etc; monitor for anterior knee pain symptoms. Add core strengthening exercises. Add side lunges and/or slideboard. Continue stationary bike and outdoor biking for ROM, strengthening and cardio. Swimming and water based controlled activities can begin. D/C to HEP if independent.    WEIGHT BEARING RESTRICTIONS: No  FALLS:  Has patient fallen in last 6 months? 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 Mild bruising about lateral incision. No erythema or excessive warmth  PALPATION: No TTP quad or surgical site, patellar mobility is confounded by edema but appears grossly WNL, no pain  UE AROM - R elbow WNL all directions  Minimal pain with end range PROM elbow flexion   UE MMT - R elbow WNL all directions   Palpation:  Tenderness to palpation at distal tricep insertion, lateral elbow   LOWER EXTREMITY ROM:     Active  Right eval Left eval Left 09/04/2022 Left 09/20/2022  Hip flexion      Hip extension      Hip internal rotation      Hip external rotation      Knee extension 0 deg Lacking 5 deg 3 1  Knee flexion 130 deg 80 deg (Able to achieve 90 deg post HEP) 118 122  (Blank rows = not tested) (Key: WFL = within functional limits not formally assessed, * = concordant pain, s = stiffness/stretching sensation, NT = not tested)  Comments: above is painless  LOWER EXTREMITY MMT:    MMT Right eval Left eval  Hip flexion    Hip abduction (modified sitting)    Hip internal rotation    Hip external rotation    Knee flexion    Knee extension    Ankle dorsiflexion     (Blank rows = not tested) (Key: WFL = within functional limits not formally assessed, * = concordant pain, s = stiffness/stretching sensation, NT = not tested)  Comments: deferred on eval given recency of surgery  LOWER EXTREMITY SPECIAL TESTS:  Deferred given recency of surgery  FUNCTIONAL TESTS:  Sit to stand transfer: weight shift towards R, gentle UE support, mild anterior placement of LLE    GAIT: Distance walked: within clinic Assistive device utilized: None Level of assistance:  Complete Independence Comments: antalgic gait LLE, reduced stance time and mildly increased trunk sway    TODAY'S TREATMENT:    [facility] Adult PT Treatment:                                                DATE: 11/20/22 Therapeutic Exercise: Treadmill forward,  2% incline with self-selected pace x 4 minutes Treadmill backward walking, 3% x 3 minutes @ 0.8 mph Wall squats with ball 0-90 degrees, 2 x 10  Wall sit at end of each set, x20 sec hold  Lateral/side lunges with slider, x 15 each  Bridge - 2x10 SL bridge 2x 10 each LE Recumbent leg press seat position 8 then 7 plate 2 - [id number]    [facility] Adult PT Treatment:                                                DATE: 10/31/2022  Therapeutic Exercise: Treadmill forward, 2% incline with self-selected pace x 4 minutes Treadmill backward walking, 3% x 3 minutes @ 0.8 mph Fwd/lat Hurdles step over (3) x 4 laps each  Wall squats with ball 0-90 degrees, 2 x 10  Wall sit at end of each set, x20 sec hold  Lateral/reverse side lunges with slider, x 15 each  Bridge - 2x10 SL bridge 2x 10 each LE  Supine marches (90-90 variation), x 20 BIL Supine (90-90) Hollow Hold, 4 x 20 sec    [facility] Adult PT Treatment:                                                DATE: 10/24/2022  Reassessment:  See objective measures and assessment   Manual Therapy: Mobilization with movement elbow flexion/extension, pronation/supination Distal glides Grade I/II                                                                                                                           PATIENT EDUCATION: Education details: Pt education on PT impairments, prognosis, and POC. Informed consent. Rationale for interventions, safe/appropriate HEP performance, safety w/ activity, icing  Person educated: Patient Education method: Explanation, Demonstration, Tactile cues, Verbal cues, and Handouts Education comprehension: verbalized understanding, returned demonstration, verbal cues  required, tactile cues required, and needs further education    HOME EXERCISE PROGRAM: Access Code: [email or web address] URL: [email or web address] Date: 10/24/2022 Prepared by: Mauri Reading  Exercises - Active Straight Leg Raise with Quad Set  - 1 x daily - 7 x weekly - 2 sets - 10-15 reps - Sidelying Hip Abduction  - 1 x daily - 7 x weekly - 2 sets - 10-15 reps - Prone Hip Extension  - 1 x daily - 7 x weekly - 2 sets - 10-15 reps - Sidelying Hip Adduction  - 1 x daily - 7 x weekly - 2 sets - 10-15 reps - Wall Quarter Squat with Swiss Ball  - 1 x daily - 7 x weekly - 2 sets - 10 reps - Single Leg Sit to Stand with Arms Crossed  - 1  x daily - 7 x weekly - 2 sets - 10 reps - Seated Isometric Elbow Flexion  - 1 x daily - 7 x weekly - 1 sets - 10 reps - 5 sec hold - Seated Isometric Elbow Extension  - 1 x daily - 7 x weekly - 1 sets - 10 reps - 5 sec hold  ASSESSMENT:  CLINICAL IMPRESSION: Patient presents to PT reporting minimal pain and that she hasn't had any new falls since last session. Session today continued to focus on LE strengthening and increasing difficulty as able. Patient was able to tolerate all prescribed exercises with no adverse effects. Patient continues to benefit from skilled PT services and should be progressed as able to improve functional independence.     OBJECTIVE IMPAIRMENTS: Abnormal gait, decreased activity tolerance, decreased endurance, decreased mobility, difficulty walking, decreased ROM, decreased strength, increased edema, impaired sensation, improper body mechanics, postural dysfunction, and pain.   ACTIVITY LIMITATIONS: carrying, lifting, bending, standing, squatting, stairs, transfers, and locomotion level  PARTICIPATION LIMITATIONS: meal prep, cleaning, laundry, community activity, and occupation  PERSONAL FACTORS: Past/current experiences and Time since onset of injury/illness/exacerbation are also affecting patient's functional outcome.    REHAB POTENTIAL: Good  CLINICAL DECISION MAKING: Evolving/moderate complexity  EVALUATION COMPLEXITY: Low   GOALS: Goals reviewed with patient? No  SHORT TERM GOALS: Target date: 10/15/2022 Pt will demonstrate appropriate understanding and performance of initially prescribed HEP in order to facilitate improved independence with management of symptoms.  Baseline: HEP provided on eval Goal status: MET  2. Pt will score greater than or equal to 65 on FOTO in order to demonstrate improved perception of function due to symptoms.  Baseline: 63  Goal status: ONGOING  LONG TERM GOALS: Target date: 11/26/2022  Pt will score 68 or greater on FOTO in order to demonstrate improved perception of function due to symptoms.  Baseline: 63 Goal status: ONGOING  2.  Pt will demonstrate at least 0-120 degrees of left knee AROM in order to facilitate improved tolerance to functional movements such as walking, stair navigation, and transfers.  Baseline: see ROM chart above Goal status: ONGOING  3.  Pt will demonstrate grossly symmetrical knee extension strength via MMT or handheld dynamometer in order to facilitate improved functional kinematics and maximize outcomes. Baseline: deferred given surgical acuity Goal status: ONGOING  4.  Pt will be able to perform 5 consecutive sit to stands without UE support and grossly WNL mechanics to improve safety w/ transfers.  Baseline: UE support for transfers w/ reduced WB through surgical limb Goal status: MET  5. Pt will report ability to stand/walk for up to 1hr in order to maximize tolerance to community navigation and household tasks.  Baseline:  altered mechanics, increased swelling with standing/walking  Goal status: NEARLY MET   6. Pt will be able to safely navigate up to 5 stairs without UE support in order to facilitate improved independence with home access.   Baseline: altered mechanics, rail use  Goal status: ONGOING  7. Pt will have  decreased QuickDASH score by at least 12%.  Baseline (10/24/22): 22.7 / 100 = 22.7 % Goal Status: INITIAL   PLAN:  PT FREQUENCY: 2x/week  PT DURATION: 4 weeks as of 10/24/22  PLANNED INTERVENTIONS: Therapeutic exercises, Therapeutic activity, Neuromuscular re-education, Balance training, Gait training, Patient/Family education, Self Care, Joint mobilization, Stair training, Aquatic Therapy, Dry Needling, Electrical stimulation, Cryotherapy, Moist heat, scar mobilization, Taping, Manual therapy, and Re-evaluation  PLAN FOR NEXT SESSION: last updated 10/03/22 per surgeon's  protocol -   Exercises: increase CC activities (0-90 deg), add pulley weights, therabands, etc; monitor for anterior knee pain symptoms. Add core strengthening exercises. Add side lunges and/or slideboard. Continue stationary bike and outdoor biking for ROM, strengthening and cardio. Swimming and water based controlled activities can begin. D/C to HEP if independent.    Berta Minor PTA 11/20/22  4:08 PM

## 2022-11-22 ENCOUNTER — Ambulatory Visit: Payer: Medicaid Other

## 2022-11-22 DIAGNOSIS — R2689 Other abnormalities of gait and mobility: Secondary | ICD-10-CM

## 2022-11-22 DIAGNOSIS — M25562 Pain in left knee: Secondary | ICD-10-CM | POA: Diagnosis not present

## 2022-11-22 DIAGNOSIS — M25521 Pain in right elbow: Secondary | ICD-10-CM

## 2022-11-22 DIAGNOSIS — M25662 Stiffness of left knee, not elsewhere classified: Secondary | ICD-10-CM

## 2022-11-22 DIAGNOSIS — R600 Localized edema: Secondary | ICD-10-CM

## 2022-11-22 NOTE — Therapy (Signed)
OUTPATIENT PHYSICAL THERAPY TREATMENT NOTE   Patient Name: Kathy White MRN: [id number] DOB:11-16-1980, 42 y.o., female Today's Date: 10/24/2022  END OF SESSION:    PT End of Session - 11/22/22 1528     Visit Number 15    Number of Visits 25    Date for PT Re-Evaluation 11/26/22    Authorization Type [employer] med[employer]    Authorization Time Period 09/04/2022 -12/02/2022    PT Start Time 1530    PT Stop Time 1608    PT Time Calculation (min) 38 min    Activity Tolerance Patient tolerated treatment well;No increased pain    Behavior During Therapy [facility] for tasks assessed/performed                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   Hematuria 04/21/2013   History of abnormal cervical Pap smear    History of HPV infection    Hypertension    IBS (irritable bowel syndrome)    Left ACL tear    Migraine    OA (osteoarthritis) of knee    Obesity    Pain of joint of left ankle and foot 10/08/2019   PCO (polycystic ovaries)    Pelvic pain 09/16/2018   S/P ACL reconstruction 03/22/2015   Stiffness of left knee 09/25/2021   Vaginal Pap smear, abnormal    Vitamin D deficiency    Past Surgical History:  Procedure Laterality Date   BREAST BIOPSY  08/07/2022   MM RT RADIOACTIVE SEED LOC MAMMO GUIDE 08/07/2022 [facility] MAMMOGRAPHY   BREAST LUMPECTOMY WITH RADIOACTIVE SEED LOCALIZATION Right 08/08/2022   Procedure: RIGHT BREAST LUMPECTOMY WITH RADIOACTIVE SEED LOCALIZATION;  Surgeon: Griselda Miner, MD;  Location: [facility];  Service: General;  Laterality: Right;   CARPAL TUNNEL RELEASE Right 04/28/2021   CERVICAL BIOPSY  W/ LOOP ELECTRODE EXCISION     CESAREAN SECTION  11/14/2004   EXAM UNDER ANESTHESIA WITH MANIPULATION OF KNEE Left 03/22/2015   Procedure: LEFT KNEE EXAM UNDER ANESTHESIA  ;  Surgeon: Eugenia Mcalpine, MD;  Location: [facility];  Service: Orthopedics;  Laterality: Left;   HAND WOUND EXPLORATION AND TENDON REPAIR'S Right 10/12/2014   right ring and index fingers   KNEE ARTHROSCOPY W/ PARTIAL MEDIAL MENISCECTOMY Left 09/23/2021   KNEE ARTHROSCOPY WITH ANTERIOR CRUCIATE LIGAMENT (ACL) REPAIR WITH HAMSTRING GRAFT Left 03/22/2015   Procedure: LEFT KNEE ARTHROSCOPY WITH ANTERIOR CRUCIATE LIGAMENT (ACL) AUTOGRAFT RECONSTRUCTION;  Surgeon: Eugenia Mcalpine, MD;  Location: [facility] [place];  Service: Orthopedics;  Laterality: Left;  ANESTHESIA: GENERAL/ABDUCTOR CANAL BLOCK   KNEE SURGERY  09/2021   LAPAROSCOPIC CHOLECYSTECTOMY  12/20/2004   LAPAROSCOPIC GASTRIC BAND REMOVAL WITH LAPAROSCOPIC GASTRIC SLEEVE RESECTION  09/09/2017   MENISECTOMY Left 03/22/2015   Procedure: PARTIAL LEFT KNEE  LATERAL MENISECTOMY, CHONDROPLASTY;  Surgeon: Eugenia Mcalpine, MD;  Location: [facility] ;  Service: Orthopedics;  Laterality: Left;   NERVE, TENDON AND ARTERY REPAIR Right 10/12/2014   Procedure: RIGHT  RING FINGER AND RIGHT SMALL FINGER WOUND EXPLORATION WITH TENDON REPAIRS;  Surgeon: Bradly Bienenstock, MD;  Location: [facility];  Service: Orthopedics;  Laterality: Right;   SYNOVECTOMY Left 08/23/2022   Procedure: SYNOVECTOMY;  Surgeon: Bjorn Pippin, MD;  Location:  [facility];  Service: Orthopedics;  Laterality: Left;   WISDOM TOOTH EXTRACTION     Patient Active Problem List   Diagnosis Date Noted   Genetic testing 08/14/2022   Family history of uterine cancer 08/02/2022   Family history of BRCA1 gene positive 08/02/2022   Dysuria 06/11/2022   Burning with  urination 05/22/2022   Urinary frequency 05/22/2022   Gastro-esophageal reflux disease without esophagitis 03/08/2022   Neuropathy 03/08/2022   Generalized abdominal pain 03/07/2022   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   Right facial numbness 01/11/2021   Carpal tunnel syndrome of right wrist 01/05/2021   Numbness of hand 01/05/2021   Mass of upper outer quadrant of right breast 07/21/2020   Pain of joint of left ankle and foot 10/08/2019   Arthritis of both knees 10/02/2019   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   Prediabetes 05/30/2017   Vitamin D deficiency 05/30/2017   PCOS (polycystic ovarian syndrome) 01/11/2016   Fibroid 09/15/2015   S/P ACL reconstruction 03/22/2015   Hypertension 03/31/2013   IUD (intrauterine device) in place 02/26/2013   FH: breast cancer in first degree relative 02/26/2013   Leg edema 11/21/2012   Migraine 07/31/2012    PCP: Dois Davenport, MD  REFERRING PROVIDER: Vernetta Honey, PA-C   REFERRING DIAG: Left knee revision ACL reconstruction with allograft 08/23/22  Right Tennis Elbow   THERAPY DIAG:  Left knee pain, unspecified chronicity  Pain in right elbow  Other abnormalities of gait and mobility  Localized edema  Stiffness of left knee, not elsewhere classified  Rationale for Evaluation and Treatment: Rehabilitation  ONSET DATE: 08/23/22 op date  SUBJECTIVE:   SUBJECTIVE STATEMENT: Patient reports no current pain in the knee and no soreness after the previous session.    PERTINENT HISTORY: Migraines, prior L ACL repair, R carpal tunnel,    PAIN:  Are you having pain: no pain Location/description: L knee, 0/10 Best-worst over past week: denies any pain since POD1  - aggravating factors: being on feet for a while, heat - Easing factors: icing, medication    Initial rt elbow assessment 10/24/22:  Are you having pain: yes pain Location/description: Rt elbow 0-1/10 "It would get to 6/10 if I  strained it by lifting something heavy."  - aggravating factors: lifting, overhead reaching, cooking ("mixing something"), pushing through R arm  - Easing factors: bracing  PRECAUTIONS: s/p R ACLR Delbert Harness Protocol, refer to media tab for details)  Phase 1 0-6 weeks post op: WBAT, goal is to be without assist by POD10 Knee immobilizer for 1 week with sleep, may be weaned during ambulation once able to do SLR without lag ROM as tolerated, goals of full extension by week 2, 120 deg flexion by week 6  Exercises: extension board and prone hang with ankle weights up to 10lbs. Stationary bike with no resistance for knee flexion (alter set height as ROM increases). Patellar mobilization 5-10 min daily. Quad sets, SLR with knee locked in extension. Begin closed chain work (0-45 deg) when FWB. No restrictions on ankle and hip strengthening.   Phase 2 6-12 weeks post op:  Full WB, no brace  No ROM limitation, increase as tolerated  Ice as much as possible, after  therapy at minimum  Exercises: increase CC activities (0-90 deg), add pulley weights, therabands, etc; monitor for anterior knee pain symptoms. Add core strengthening exercises. Add side lunges and/or slideboard. Continue stationary bike and outdoor biking for ROM, strengthening and cardio. Swimming and water based controlled activities can begin. D/C to HEP if independent.    WEIGHT BEARING RESTRICTIONS: No  FALLS:  Has patient fallen in last 6 months? 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 Mild bruising about lateral incision. No erythema or excessive warmth  PALPATION: No TTP quad or surgical site, patellar mobility is confounded by edema but appears grossly WNL, no pain  UE AROM - R elbow WNL all directions  Minimal pain with end range PROM elbow flexion   UE MMT - R elbow WNL all directions   Palpation:  Tenderness to palpation at distal tricep insertion, lateral elbow   LOWER EXTREMITY ROM:     Active  Right eval Left eval Left 09/04/2022 Left 09/20/2022  Hip flexion      Hip extension      Hip internal rotation      Hip external rotation      Knee extension 0 deg Lacking 5 deg 3 1  Knee flexion 130 deg 80 deg (Able to achieve 90 deg post HEP) 118 122  (Blank rows = not tested) (Key: WFL = within functional limits not formally assessed, * = concordant pain, s = stiffness/stretching sensation, NT = not tested)  Comments: above is painless  LOWER EXTREMITY MMT:    MMT Right eval Left eval  Hip flexion    Hip abduction (modified sitting)    Hip internal rotation    Hip external rotation    Knee flexion    Knee extension    Ankle dorsiflexion     (Blank rows = not tested) (Key: WFL = within functional limits not formally assessed, * = concordant pain, s = stiffness/stretching sensation, NT = not tested)  Comments: deferred on eval given recency of surgery  LOWER EXTREMITY SPECIAL TESTS:  Deferred given recency of surgery  FUNCTIONAL TESTS:  Sit to stand transfer: weight shift towards R, gentle UE support, mild anterior placement of LLE    GAIT: Distance walked: within clinic Assistive device utilized: None Level of assistance: Complete Independence Comments: antalgic gait  LLE, reduced stance time and mildly increased trunk sway    TODAY'S TREATMENT:   [facility] Adult PT Treatment:                                                DATE: 11/22/22 Therapeutic Exercise: Treadmill forward, 3% incline with self-selected pace x  5 minutes Treadmill backward walking, 3% x 3 minutes @ 0.8 mph Wall squats with ball 0-90 degrees, 2 x 10  Wall sit at end of each set, x20 sec hold  Lunge to 2 airex x10 BIL  TRX squats 2x10 TRX lunge bwd x10 BIL Recumbent leg press seat position 8 then 7 plate 2 - 2V25, seat 6 x 5   [facility] Adult PT Treatment:                                                DATE: 11/20/22 Therapeutic Exercise: Treadmill forward, 2% incline with self-selected pace x 4 minutes Treadmill backward walking, 3% x 3 minutes @ 0.8 mph Wall squats with ball 0-90 degrees, 2 x 10  Wall sit at end of each set, x20 sec hold  Lateral/side lunges with slider, x 15 each  Bridge - 2x10 SL bridge 2x 10 each LE Recumbent leg press seat position 8 then 7 plate 2 - 3G64    [facility] Adult PT Treatment:                                                DATE: 10/31/2022  Therapeutic Exercise: Treadmill forward, 2% incline with self-selected pace x 4 minutes Treadmill backward walking, 3% x 3 minutes @ 0.8 mph Fwd/lat Hurdles step over (3) x 4 laps each  Wall squats with ball 0-90 degrees, 2 x 10  Wall sit at end of each set, x20 sec hold  Lateral/reverse side lunges with slider, x 15 each  Bridge - 2x10 SL bridge 2x 10 each LE  Supine marches (90-90 variation), x 20 BIL Supine (90-90) Hollow Hold, 4 x 20 sec                                                                                                                            PATIENT EDUCATION: Education details: Pt education on PT impairments, prognosis, and POC. Informed consent. Rationale for interventions, safe/appropriate HEP performance, safety w/ activity, icing  Person educated: Patient Education method: Explanation,  Demonstration, Tactile cues, Verbal cues, and Handouts Education comprehension: verbalized understanding, returned demonstration, verbal cues required, tactile cues required, and needs further education    HOME EXERCISE PROGRAM: Access Code: [email or web address] URL: [email or web address] Date: 10/24/2022 Prepared by: Mauri Reading  Exercises - Active Straight Leg Raise with Quad Set  - 1 x daily - 7 x weekly - 2 sets - 10-15 reps - Sidelying Hip Abduction  - 1 x daily - 7 x weekly - 2 sets - 10-15 reps - Prone Hip Extension  - 1 x daily - 7 x weekly - 2 sets - 10-15 reps - Sidelying Hip Adduction  - 1  x daily - 7 x weekly - 2 sets - 10-15 reps - Wall Quarter Squat with Swiss Ball  - 1 x daily - 7 x weekly - 2 sets - 10 reps - Single Leg Sit to Stand with Arms Crossed  - 1 x daily - 7 x weekly - 2 sets - 10 reps - Seated Isometric Elbow Flexion  - 1 x daily - 7 x weekly - 1 sets - 10 reps - 5 sec hold - Seated Isometric Elbow Extension  - 1 x daily - 7 x weekly - 1 sets - 10 reps - 5 sec hold  ASSESSMENT:  CLINICAL IMPRESSION: Patient presents to PT reporting no current pain and no soreness after previous session. Session today continued to focus on closed chain LE strengthening with utilization of TRX today to good effect. Patient was able to tolerate all prescribed exercises with no adverse effects. Patient continues to benefit from skilled PT services and should be progressed as able to improve functional independence.    OBJECTIVE IMPAIRMENTS: Abnormal gait, decreased activity tolerance, decreased endurance, decreased mobility, difficulty walking, decreased ROM, decreased strength, increased edema, impaired sensation, improper body mechanics, postural dysfunction, and pain.   ACTIVITY LIMITATIONS: carrying, lifting, bending, standing, squatting, stairs, transfers, and locomotion level  PARTICIPATION LIMITATIONS: meal prep, cleaning, laundry, community activity, and  occupation  PERSONAL FACTORS: Past/current experiences and Time since onset of injury/illness/exacerbation are also affecting patient's functional outcome.   REHAB POTENTIAL: Good  CLINICAL DECISION MAKING: Evolving/moderate complexity  EVALUATION COMPLEXITY: Low   GOALS: Goals reviewed with patient? No  SHORT TERM GOALS: Target date: 10/15/2022 Pt will demonstrate appropriate understanding and performance of initially prescribed HEP in order to facilitate improved independence with management of symptoms.  Baseline: HEP provided on eval Goal status: MET  2. Pt will score greater than or equal to 65 on FOTO in order to demonstrate improved perception of function due to symptoms.  Baseline: 63  Goal status: ONGOING  LONG TERM GOALS: Target date: 11/26/2022  Pt will score 68 or greater on FOTO in order to demonstrate improved perception of function due to symptoms.  Baseline: 63 Goal status: ONGOING  2.  Pt will demonstrate at least 0-120 degrees of left knee AROM in order to facilitate improved tolerance to functional movements such as walking, stair navigation, and transfers.  Baseline: see ROM chart above Goal status: ONGOING  3.  Pt will demonstrate grossly symmetrical knee extension strength via MMT or handheld dynamometer in order to facilitate improved functional kinematics and maximize outcomes. Baseline: deferred given surgical acuity Goal status: ONGOING  4.  Pt will be able to perform 5 consecutive sit to stands without UE support and grossly WNL mechanics to improve safety w/ transfers.  Baseline: UE support for transfers w/ reduced WB through surgical limb Goal status: MET  5. Pt will report ability to stand/walk for up to 1hr in order to maximize tolerance to community navigation and household tasks.  Baseline:  altered mechanics, increased swelling with standing/walking  Goal status: NEARLY MET   6. Pt will be able to safely navigate up to 5 stairs without UE  support in order to facilitate improved independence with home access.   Baseline: altered mechanics, rail use  Goal status: ONGOING  7. Pt will have decreased QuickDASH score by at least 12%.  Baseline (10/24/22): 22.7 / 100 = 22.7 % Goal Status: INITIAL   PLAN:  PT FREQUENCY: 2x/week  PT DURATION: 4 weeks as of 10/24/22  PLANNED INTERVENTIONS: Therapeutic exercises, Therapeutic activity, Neuromuscular re-education, Balance training, Gait training, Patient/Family education, Self Care, Joint mobilization, Stair training, Aquatic Therapy, Dry Needling, Electrical stimulation, Cryotherapy, Moist heat, scar mobilization, Taping, Manual therapy, and Re-evaluation  PLAN FOR NEXT SESSION: last updated 10/03/22 per surgeon's protocol -   Exercises: increase CC activities (0-90 deg), add pulley weights, therabands, etc; monitor for anterior knee pain symptoms. Add core strengthening exercises. Add side lunges and/or slideboard. Continue stationary bike and outdoor biking for ROM, strengthening and cardio. Swimming and water based controlled activities can begin. D/C to HEP if independent.    Berta Minor PTA 11/22/22  4:09 PM

## 2022-11-27 ENCOUNTER — Ambulatory Visit: Payer: Medicaid Other | Admitting: Adult Health

## 2022-11-27 ENCOUNTER — Encounter: Payer: Self-pay | Admitting: Adult Health

## 2022-11-27 ENCOUNTER — Other Ambulatory Visit (HOSPITAL_COMMUNITY)
Admission: RE | Admit: 2022-11-27 | Discharge: 2022-11-27 | Disposition: A | Discharge status: Home / Self Care | Payer: Medicaid Other | Source: Ambulatory Visit | Attending: Adult Health | Admitting: Adult Health

## 2022-11-27 VITALS — BP 128/88 | HR 64 | Ht 63.0 in | Wt 224.0 lb

## 2022-11-27 DIAGNOSIS — Z975 Presence of (intrauterine) contraceptive device: Secondary | ICD-10-CM

## 2022-11-27 DIAGNOSIS — Z1211 Encounter for screening for malignant neoplasm of colon: Secondary | ICD-10-CM | POA: Insufficient documentation

## 2022-11-27 DIAGNOSIS — Z0000 Encounter for general adult medical examination without abnormal findings: Secondary | ICD-10-CM | POA: Insufficient documentation

## 2022-11-27 DIAGNOSIS — Z1331 Encounter for screening for depression: Secondary | ICD-10-CM

## 2022-11-27 DIAGNOSIS — Z803 Family history of malignant neoplasm of breast: Secondary | ICD-10-CM

## 2022-11-27 DIAGNOSIS — Z01419 Encounter for gynecological examination (general) (routine) without abnormal findings: Secondary | ICD-10-CM | POA: Insufficient documentation

## 2022-11-27 DIAGNOSIS — N6091 Unspecified benign mammary dysplasia of right breast: Secondary | ICD-10-CM

## 2022-11-27 LAB — HEMOCCULT GUIAC POC 1CARD (OFFICE): Fecal Occult Blood, POC: NEGATIVE

## 2022-11-27 NOTE — Progress Notes (Signed)
Patient ID: Kathy White, female   DOB: 03-Jan-1981, 42 y.o.   MRN: [id number] History of Present Illness: Kathy White is a 42 year old white female,single, G1P0101, in for a well woman gyn exam and pap. She had MR breast and lumpectomy by Dr Jacelyn Grip has atypical ductal hyperplasia of right breast. She said genetic testing was negative. Has see Dr Al Pimple and prescribed tamoxifen but only took a few and stopped, had nausea, she sees her tomorrow.  PCP is Nadyne Coombes MD   Current Medications, Allergies, Past Medical History, Past Surgical History, Family History and Social History were reviewed in [employer] record.     Review of Systems: Patient denies any headaches, hearing loss, fatigue, blurred vision, shortness of breath, chest pain, abdominal pain, problems with bowel movements, urination, or intercourse. No joint pain or mood swings.     Physical Exam:BP 128/88 (BP Location: Left Arm, Patient Position: Sitting, Cuff Size: Normal)   Pulse 64   Ht 5\' 3"  (1.6 m)   Wt 224 lb (101.6 kg)   LMP 11/15/2022 (Approximate)   BMI 39.68 kg/m   she has lost about 11 lbs she says on semaglutide  General:  Well developed, well nourished, no acute distress Skin:  Warm and dry Neck:  Midline trachea, normal thyroid, good ROM, no lymphadenopathy Lungs; Clear to auscultation bilaterally Breast:  No dominant palpable mass, retraction, or nipple discharge, has tender right breast  Cardiovascular: Regular rate and rhythm Abdomen:  Soft, non tender, no hepatosplenomegaly Pelvic:  External genitalia is normal in appearance, no lesions.  The vagina is normal in appearance,brown discharge. Urethra has no lesions or masses. The cervix is smooth, +IUD strings at os, pap with HR HPV genotyping performed.  Uterus is felt to be normal size, shape, and contour.  No adnexal masses or tenderness noted.Bladder is non tender, no masses felt. Rectal: Good sphincter tone, no polyps, or hemorrhoids  felt.  Hemoccult negative. Extremities/musculoskeletal:  No swelling or varicosities noted, no clubbing or cyanosis Psych:  No mood changes, alert and cooperative,seems happy AA is 0 Fall risk is moderate    11/27/2022    3:34 PM 10/23/2021    1:54 PM 07/21/2020    3:43 PM  Depression screen PHQ 2/9  Decreased Interest 0 0 0  Down, Depressed, Hopeless 0 0 0  PHQ - 2 Score 0 0 0  Altered sleeping 1 2 1   Tired, decreased energy 1 1 1   Change in appetite 1 1 2   Feeling bad or failure about yourself  0 0 0  Trouble concentrating 0 0 0  Moving slowly or fidgety/restless 0 0 0  Suicidal thoughts 0 0 0  PHQ-9 Score 3 4 4        11/27/2022    3:35 PM 10/23/2021    1:55 PM 07/21/2020    3:44 PM  GAD 7 : Generalized Anxiety Score  Nervous, Anxious, on Edge 0 1 0  Control/stop worrying 1 1 0  Worry too much - different things 1 1 1   Trouble relaxing 1 1 1   Restless 0 1 0  Easily annoyed or irritable 1 1 0  Afraid - awful might happen 1 1 0  Total GAD 7 Score 5 7 2       Upstream - 11/27/22 1532       Pregnancy Intention Screening   Does the patient want to become pregnant in the next year? No    Does the patient's partner want to become pregnant  in the next year? No    Would the patient like to discuss contraceptive options today? No      Contraception Wrap Up   Current Method IUD or IUS    End Method IUD or IUS    Contraception Counseling Provided Yes             Examination chaperoned by Malachy Mood LPN   Impression and Plan: 1. Routine general medical examination at a health care facility Pap sent Pap in 3 years if normal Physical in 1 year Labs with PCP Get mammogram and MR per Dr Carolynne Edouard or Dr Al Pimple  - Cytology - PAP( [place])  2. Encounter for screening fecal occult blood testing Hemoccult was negative  - POCT occult blood stool  3. IUD (intrauterine device) in place Mirena placed 01/28/71 Will need Cytotec for removal and reinsertion   4. FH: breast  cancer in first degree relative  5. Encounter for routine gynecological examination with Papanicolaou smear of cervix Pap sent   6. Atypical ductal hyperplasia of right breast She has appt with Dr Al Pimple tomorrow, only took tamoxifen a few days, had nausea and stopped

## 2022-11-28 ENCOUNTER — Other Ambulatory Visit: Payer: Self-pay | Admitting: Adult Health

## 2022-11-28 ENCOUNTER — Inpatient Hospital Stay: Payer: Medicaid Other | Attending: Hematology and Oncology | Admitting: Hematology and Oncology

## 2022-11-28 ENCOUNTER — Ambulatory Visit: Payer: Medicaid Other

## 2022-11-28 VITALS — BP 119/73 | HR 65 | Temp 98.1°F | Resp 18 | Ht 63.0 in | Wt 222.5 lb

## 2022-11-28 DIAGNOSIS — M25521 Pain in right elbow: Secondary | ICD-10-CM

## 2022-11-28 DIAGNOSIS — M25562 Pain in left knee: Secondary | ICD-10-CM | POA: Diagnosis not present

## 2022-11-28 DIAGNOSIS — N6091 Unspecified benign mammary dysplasia of right breast: Secondary | ICD-10-CM | POA: Insufficient documentation

## 2022-11-28 DIAGNOSIS — Z1231 Encounter for screening mammogram for malignant neoplasm of breast: Secondary | ICD-10-CM

## 2022-11-28 DIAGNOSIS — Z9189 Other specified personal risk factors, not elsewhere classified: Secondary | ICD-10-CM

## 2022-11-28 NOTE — Progress Notes (Signed)
[facility] CONSULT NOTE  Patient Care Team: Dois Davenport, MD as PCP - General (Family Medicine) Jeani Hawking, MD as Consulting Physician (Gastroenterology) Suanne Marker, MD as Consulting Physician (Neurology) Eugenia Mcalpine, MD (Inactive) as Consulting Physician (Orthopedic Surgery) Adline Potter, NP as Nurse Practitioner (Obstetrics and Gynecology)  CHIEF COMPLAINTS/PURPOSE OF CONSULTATION:  ADH of right breast.  ASSESSMENT & PLAN:   This is a very pleasant 42 yr old female patient with strong FH in mom, sister, maternal grandmother with [facility] referred to high risk breast clinic because of ADH Prognosis:Using the TC model calculated, life time risk calculated is around 36%.   Breast Cancer Post-surgical and MRI follow-up with no significant changes noted.  -Patient is on Tamoxifen but has been experiencing nausea leading to inconsistent use. -Try taking Tamoxifen at night to potentially sleep through nausea. -She is already on 5 mg po daily, so no further dose adjustment can be made.  Hair Loss Patient reports significant hair loss, cause unknown. No significant weight loss reported. -Recommend trial of Biotin or Nutrafol for hair growth. -could be stress induced  General Health Maintenance -Scheduled mammogram on October 9th, 2024. -Next MRI due in Spring 2025. -Return visit in 6 months to reset cycle with OB visits.      HISTORY OF PRESENTING ILLNESS:  Kathy White 42 y.o. female is here because of ADH of right breast.   At baseline she has some mild arthritis, multiple surgical repairs related to arthritis, hypertension, obesity.  She is currently taking bupropion for weight loss, previously was tried on phentermine.  She has a very strong family history of breast cancer and multiple family members with BRCA1 although she tested negative back when she was 20.  Her mother, her sister as well as her maternal grandmother had breast cancer.    Discussed the use of AI scribe software for clinical note transcription with the patient, who gave verbal consent to proceed.  History of Present Illness    The patient, with a history of breast cancer, presents for follow-up after recent biopsies and surgery. She reports that she has been healing well and has had a recent physical with her OBGYN, who found everything to be in order. She has also scheduled a mammogram after receiving a letter prompting her to do so.  The patient has been prescribed tamoxifen, but reports that it has been causing nausea. She took the medication for two weeks but then stopped due to the side effects. She has not tried taking it at night, which is a potential solution suggested by the doctor.  In addition to the issues with tamoxifen, the patient reports hair loss. She has started [facility] for weight loss about 4 weeks ago. She denies any changes in her breast. She is willing to try tamoxifen at night.   MEDICAL HISTORY:  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   Hematuria 04/21/2013   History of abnormal cervical Pap smear    History of  HPV infection    Hypertension    IBS (irritable bowel syndrome)    Left ACL tear    Migraine    OA (osteoarthritis) of knee    Obesity    Pain of joint of left ankle and foot 10/08/2019   PCO (polycystic ovaries)    Pelvic pain 09/16/2018   S/P ACL reconstruction 03/22/2015   Stiffness of left knee 09/25/2021   Vaginal Pap smear, abnormal    Vitamin D deficiency     SURGICAL  HISTORY: Past Surgical History:  Procedure Laterality Date   BREAST BIOPSY  08/07/2022   MM RT RADIOACTIVE SEED LOC MAMMO GUIDE 08/07/2022 [facility] MAMMOGRAPHY   BREAST LUMPECTOMY WITH RADIOACTIVE SEED LOCALIZATION Right 08/08/2022   Procedure: RIGHT BREAST LUMPECTOMY WITH RADIOACTIVE SEED LOCALIZATION;  Surgeon: Griselda Miner, MD;  Location: [facility];  Service: General;  Laterality: Right;   CARPAL TUNNEL RELEASE Right 04/28/2021   CERVICAL BIOPSY  W/ LOOP ELECTRODE EXCISION     CESAREAN SECTION  11/14/2004   EXAM UNDER ANESTHESIA WITH MANIPULATION OF KNEE Left 03/22/2015   Procedure: LEFT KNEE EXAM UNDER ANESTHESIA  ;  Surgeon: Eugenia Mcalpine, MD;  Location: [facility];  Service: Orthopedics;  Laterality: Left;   HAND WOUND EXPLORATION AND TENDON REPAIR'S Right 10/12/2014   right ring and index fingers   KNEE ARTHROSCOPY W/ PARTIAL MEDIAL MENISCECTOMY Left 09/23/2021   KNEE ARTHROSCOPY WITH ANTERIOR CRUCIATE LIGAMENT (ACL) REPAIR WITH HAMSTRING GRAFT Left 03/22/2015   Procedure: LEFT KNEE ARTHROSCOPY WITH ANTERIOR CRUCIATE LIGAMENT (ACL) AUTOGRAFT RECONSTRUCTION;  Surgeon: Eugenia Mcalpine, MD;  Location: [facility];  Service: Orthopedics;  Laterality: Left;  ANESTHESIA: GENERAL/ABDUCTOR CANAL BLOCK   KNEE SURGERY  09/2021   LAPAROSCOPIC CHOLECYSTECTOMY  12/20/2004   LAPAROSCOPIC GASTRIC BAND REMOVAL WITH LAPAROSCOPIC GASTRIC SLEEVE RESECTION  09/09/2017   MENISECTOMY Left 03/22/2015   Procedure: PARTIAL LEFT KNEE  LATERAL MENISECTOMY, CHONDROPLASTY;  Surgeon: Eugenia Mcalpine, MD;  Location[facility];  Service: Orthopedics;  Laterality: Left;   NERVE, TENDON AND ARTERY REPAIR Right 10/12/2014   Procedure: RIGHT RING FINGER AND RIGHT SMALL FINGER WOUND EXPLORATION WITH TENDON REPAIRS;  Surgeon: Bradly Bienenstock, MD;  Location: [facility];  Service: Orthopedics;  Laterality: Right;   SYNOVECTOMY Left 08/23/2022   Procedure: SYNOVECTOMY;   Surgeon: Bjorn Pippin, MD;  Location: [facility];  Service: Orthopedics;  Laterality: Left;   WISDOM TOOTH EXTRACTION      SOCIAL HISTORY: Social History   Socioeconomic History   Marital status: Single    Spouse name: Not on file   Number of children: 1   Years of education: Not on file   Highest education level: Bachelor's degree (e.g., BA, AB, BS)  Occupational History   Not on file  Tobacco Use   Smoking status: Never   Smokeless tobacco: Never  Vaping Use   Vaping status: Never Used  Substance and Sexual Activity   Alcohol use: No   Drug use: No   Sexual activity: Yes    Birth control/protection: I.U.D.  Other Topics Concern   Not on file  Social History Narrative   Not on file   Social Determinants of Health   Financial Resource Strain: Low Risk  (11/27/2022)   Overall Financial Resource Strain (CARDIA)    Difficulty of Paying Living Expenses: Not hard at all  Recent Concern: Financial Resource Strain - Medium Risk (10/16/2022)   [facility] from [facility]   Overall Financial Resource  Strain (CARDIA)    Difficulty of Paying Living Expenses: Somewhat hard  Food Insecurity: No Food Insecurity (11/27/2022)   Hunger Vital Sign    Worried About Running Out of Food in the Last Year: Never true    Ran Out of Food in the Last Year: Never true  Transportation Needs: No Transportation Needs (11/27/2022)   PRAPARE - Administrator, Civil Service (Medical): No    Lack of Transportation (Non-Medical): No  Physical Activity: Insufficiently Active (11/27/2022)   Exercise Vital Sign    Days of Exercise per Week: 2 days    Minutes of Exercise per Session: 40 min  Stress: Stress Concern Present (11/27/2022)   [employer] of Occupational Health - Occupational Stress Questionnaire    Feeling of Stress : To some extent  Social Connections: Socially Isolated (11/27/2022)   Social Connection and Isolation Panel [NHANES]    Frequency of Communication  with Friends and Family: Once a week    Frequency of Social Gatherings with Friends and Family: Once a week    Attends Religious Services: Never    Database administrator or Organizations: No    Attends Banker Meetings: Never    Marital Status: Living with partner  Intimate Partner Violence: Not At Risk (11/27/2022)   Humiliation, Afraid, Rape, and Kick questionnaire    Fear of Current or Ex-Partner: No    Emotionally Abused: No    Physically Abused: No    Sexually Abused: No    FAMILY HISTORY: Family History  Problem Relation Age of Onset   Hypertension Mother    Breast cancer Mother 54   BRCA 1/2 Mother        BRCA1+   Diabetes Father    Hypertension Father    Other Father        open heart surgery   Anxiety disorder Father    Depression Father    Heart disease Father    Stroke Father    Hypertension Sister    Diabetes Sister    Breast cancer Sister 5       bilateral   Stroke Sister    Anxiety disorder Sister    Depression Sister    Heart disease Sister    BRCA 1/2 Sister        BRCA1   Diabetes Sister    Hypertension Sister    Neuropathy Sister    Obesity Sister    Anxiety disorder Sister    Depression Sister    Hypertension Sister    Thyroid disease Sister    Endometrial cancer Sister 58   Obesity Sister    Thyroid disease Brother    BRCA 1/2 Maternal Grandmother        BRCA1 pos   Breast cancer Maternal Grandmother        bilateral, d. 78   Bone cancer Maternal Grandfather    Diabetes Paternal Grandfather    Anxiety disorder Son    Depression Son    Obesity Son    Cancer Maternal Aunt    Breast cancer Maternal Aunt        bilateral; d. 3   BRCA 1/2 Cousin    Breast cancer Cousin 26       d. 23    ALLERGIES:  is allergic to bee venom, hydrocodone, oxycodone-acetaminophen, amlodipine, attends briefs small, percocet [oxycodone-acetaminophen], and tape.  MEDICATIONS:  Current Outpatient Medications  Medication Sig Dispense Refill    budesonide-formoterol (SYMBICORT) 160-4.5 MCG/ACT inhaler As needed.  Calcium Carb-Cholecalciferol (CALTRATE 600+D3 PO) Take 1 tablet by mouth daily.     carvedilol (COREG) 12.5 MG tablet Take 1 tablet (12.5 mg total) by mouth 2 (two) times daily with a meal. 180 tablet 3   cetirizine (ZYRTEC) 10 MG tablet Take by mouth.     cholecalciferol (VITAMIN D) 1000 units tablet Take 4,000 Units by mouth daily.     Cranberry-Vitamin C-Probiotic (AZO CRANBERRY PO) Take 2 capsules by mouth daily in the afternoon.     cyclobenzaprine (FLEXERIL) 10 MG tablet As needed.     famotidine (PEPCID) 40 MG tablet Take 40 mg by mouth daily.     fluticasone (FLONASE) 50 MCG/ACT nasal spray Place 2 sprays into the nose as needed for allergies.      gabapentin (NEURONTIN) 400 MG capsule Take 400 mg by mouth at bedtime.     hydrochlorothiazide (HYDRODIURIL) 25 MG tablet Take 1 tablet by mouth daily.     hydrOXYzine (ATARAX/VISTARIL) 10 MG tablet hydroxyzine HCl 10 mg tablet     ipratropium (ATROVENT) 0.06 % nasal spray 2 sprays in each nostril     JANUVIA 50 MG tablet Take 50 mg by mouth daily.     levonorgestrel (MIRENA) 20 MCG/24HR IUD 1 each by Intrauterine route once.     linaclotide (LINZESS) 145 MCG CAPS capsule Take 145 mcg by mouth. As needed.     losartan (COZAAR) 100 MG tablet Take 1 tablet by mouth daily.     montelukast (SINGULAIR) 10 MG tablet Take 1 tablet by mouth daily.     Multiple Vitamins-Calcium (ONE-A-DAY WOMENS PO) Take by mouth daily.     naltrexone (DEPADE) 50 MG tablet Take 25 mg by mouth daily.     Semaglutide-Weight Management 0.5 MG/0.5ML SOAJ Inject 0.5 mg into the skin once a week. 2 mL 0   tamoxifen (NOLVADEX) 10 MG tablet Take 0.5 tablets (5 mg total) by mouth daily. (Patient not taking: Reported on 11/27/2022) 45 tablet 3   traZODone (DESYREL) 50 MG tablet Take 50 mg by mouth at bedtime.     triamcinolone lotion (KENALOG) 0.1 %      No current facility-administered medications  for this visit.     PHYSICAL EXAMINATION: ECOG PERFORMANCE STATUS: 0 - Asymptomatic  Vitals:   11/28/22 1350  BP: 119/73  Pulse: 65  Resp: 18  Temp: 98.1 F (36.7 C)  SpO2: 99%    Filed Weights   11/28/22 1350  Weight: 222 lb 8 oz (100.9 kg)     GENERAL:alert, no distress and comfortable Breast: Bilateral breasts inspected and palpated.  Heterogeneously dense breasts.  No overtly palpable masses or regional adenopathy. Small masses may not be felt given density.  LABORATORY DATA:  I have reviewed the data as listed Lab Results  Component Value Date   WBC 14.0 (H) 12/25/2021   HGB 12.7 12/25/2021   HCT 38.8 12/25/2021   MCV 88.2 12/25/2021   PLT 280 12/25/2021     Chemistry      Component Value Date/Time   NA 135 08/07/2022 1412   K 3.5 08/07/2022 1412   CL 104 08/07/2022 1412   CO2 24 08/07/2022 1412   BUN 8 08/07/2022 1412   CREATININE 0.83 08/07/2022 1412      Component Value Date/Time   CALCIUM 8.7 (L) 08/07/2022 1412   ALKPHOS 79 12/25/2021 2224   AST 97 (H) 12/25/2021 2224   ALT 64 (H) 12/25/2021 2224   BILITOT 0.8 12/25/2021 2224  RADIOGRAPHIC STUDIES: I have personally reviewed the radiological images as listed and agreed with the findings in the report. No results found.  All questions were answered. The patient knows to call the clinic with any problems, questions or concerns. I spent 30  minutes in the care of this patient including H and P, review of records, counseling and coordination of care.     Rachel Moulds, MD 11/28/2022 3:18 PM

## 2022-11-28 NOTE — Therapy (Signed)
OUTPATIENT PHYSICAL THERAPY NOTE   PHYSICAL THERAPY DISCHARGE SUMMARY  Visits from Start of Care: 16  Current functional level related to goals / functional outcomes: See objective findings/assessment    Remaining deficits: See objective findings/assessment    Education / Equipment: See today's treatment/assessment      Patient agrees to discharge. Patient goals were met. Patient is being discharged due to meeting the stated rehab goals.     Patient Name: Kathy White MRN: [id number] DOB:1980-06-24, 42 y.o., female Today's Date: 11/28/2022     END OF SESSION:    PT End of Session - 11/28/22 1444     Visit Number 16    Number of Visits 25    Date for PT Re-Evaluation 11/26/22    Authorization Type [place] medicaid healthy blue    Authorization Time Period 09/04/2022 -12/02/2022    PT Start Time 1445    PT Stop Time 1525    PT Time Calculation (min) 40 min    Activity Tolerance Patient tolerated treatment well;No increased pain    Behavior During Therapy [facility] for tasks assessed/performed               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   Hematuria 04/21/2013   History of abnormal cervical Pap smear    History of HPV infection    Hypertension    IBS (irritable bowel syndrome)    Left ACL tear    Migraine    OA (osteoarthritis) of knee    Obesity    Pain of joint of left ankle and foot 10/08/2019   PCO (polycystic ovaries)    Pelvic pain 09/16/2018   S/P ACL reconstruction 03/22/2015   Stiffness of left knee  09/25/2021   Vaginal Pap smear, abnormal    Vitamin D deficiency    Past Surgical History:  Procedure Laterality Date   BREAST BIOPSY  08/07/2022   MM RT RADIOACTIVE SEED LOC MAMMO GUIDE 08/07/2022 [facility] MAMMOGRAPHY   BREAST LUMPECTOMY WITH RADIOACTIVE SEED LOCALIZATION Right 08/08/2022   Procedure: RIGHT BREAST LUMPECTOMY WITH RADIOACTIVE SEED LOCALIZATION;  Surgeon: Griselda Miner, MD;  Location: [facility];  Service: General;  Laterality: Right;   CARPAL TUNNEL RELEASE Right 04/28/2021   CERVICAL BIOPSY  W/ LOOP ELECTRODE EXCISION     CESAREAN SECTION  11/14/2004   EXAM UNDER ANESTHESIA WITH MANIPULATION OF KNEE Left 03/22/2015   Procedure: LEFT KNEE EXAM UNDER ANESTHESIA  ;  Surgeon: Eugenia Mcalpine, MD;  Location: [facility] [place];  Service: Orthopedics;  Laterality: Left;   HAND WOUND EXPLORATION AND TENDON REPAIR'S Right 10/12/2014   right ring and index fingers   KNEE ARTHROSCOPY W/ PARTIAL MEDIAL MENISCECTOMY Left 09/23/2021   KNEE ARTHROSCOPY WITH ANTERIOR CRUCIATE LIGAMENT (ACL) REPAIR WITH HAMSTRING GRAFT Left 03/22/2015   Procedure: LEFT KNEE ARTHROSCOPY WITH ANTERIOR CRUCIATE LIGAMENT (ACL) AUTOGRAFT RECONSTRUCTION;  Surgeon: Eugenia Mcalpine, MD;  Location: [facility];  Service: Orthopedics;  Laterality: Left;  ANESTHESIA: GENERAL/ABDUCTOR CANAL BLOCK  KNEE SURGERY  09/2021   LAPAROSCOPIC CHOLECYSTECTOMY  12/20/2004   LAPAROSCOPIC GASTRIC BAND REMOVAL WITH LAPAROSCOPIC GASTRIC SLEEVE RESECTION  09/09/2017   MENISECTOMY Left 03/22/2015   Procedure: PARTIAL LEFT KNEE  LATERAL MENISECTOMY, CHONDROPLASTY;  Surgeon: Eugenia Mcalpine, MD;  Location: [facility];  Service: Orthopedics;  Laterality: Left;   NERVE, TENDON AND ARTERY REPAIR Right 10/12/2014   Procedure: RIGHT RING FINGER AND RIGHT SMALL FINGER WOUND EXPLORATION WITH TENDON REPAIRS;  Surgeon: Bradly Bienenstock, MD;  Location: [facility];  Service: Orthopedics;  Laterality: Right;    SYNOVECTOMY Left 08/23/2022   Procedure: SYNOVECTOMY;  Surgeon: Bjorn Pippin, MD;  Location: [facility];  Service: Orthopedics;  Laterality: Left;   WISDOM TOOTH EXTRACTION     Patient Active Problem List   Diagnosis Date Noted   Encounter for screening fecal occult blood testing 11/27/2022   Routine general medical examination at a health care facility 11/27/2022   Encounter for routine gynecological examination with Papanicolaou smear of cervix 11/27/2022   Atypical ductal hyperplasia of right breast 11/27/2022   Genetic testing 08/14/2022   Family history of uterine cancer 08/02/2022   Family history of BRCA1 gene positive 08/02/2022   Dysuria 06/11/2022   Burning with urination 05/22/2022   Urinary frequency 05/22/2022   Gastro-esophageal reflux disease without esophagitis 03/08/2022   Neuropathy 03/08/2022   Generalized abdominal pain 03/07/2022   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   Right facial numbness 01/11/2021   Carpal tunnel syndrome of right wrist 01/05/2021   Numbness of hand 01/05/2021   Mass of upper outer quadrant of right breast 07/21/2020   Pain of joint of left ankle and foot 10/08/2019   Arthritis of both knees 10/02/2019   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   Prediabetes 05/30/2017   Vitamin D deficiency 05/30/2017   PCOS (polycystic ovarian syndrome) 01/11/2016   Fibroid 09/15/2015   S/P ACL reconstruction 03/22/2015   Hypertension 03/31/2013   IUD (intrauterine device) in place 02/26/2013   FH: breast cancer in first degree relative 02/26/2013   Leg edema 11/21/2012   Migraine 07/31/2012    PCP: Dois Davenport, MD  REFERRING PROVIDER: Vernetta Honey, PA-C   REFERRING DIAG: Left knee revision ACL reconstruction with allograft 08/23/22  Right Tennis Elbow   THERAPY DIAG:  Left knee pain, unspecified chronicity  Pain in right elbow  Rationale for Evaluation and Treatment: Rehabilitation  ONSET  DATE: 08/23/22 op date  SUBJECTIVE:   SUBJECTIVE STATEMENT: Kathy White reports to PT with mild pain at lateral aspect of knee. She reports that her elbow pain is mostly resolved and only present occasionally. She states that she has some knee pain with standing and walking > 2 hours, especially when she is in a small space. She reports that she is able to navigate stairs and perform sit-to-stand from low surfaces.    PERTINENT HISTORY: Migraines, prior L ACL repair, R carpal tunnel,    PAIN:  Are you having pain: no pain Location/description: L knee, 0/10 Best-worst over past week: denies any pain since POD1  - aggravating factors: being on feet for a while, heat - Easing factors: icing, medication    Initial rt elbow assessment 10/24/22:  Are you having pain: yes pain Location/description: Rt elbow 0-1/10 "It would get to 6/10 if I strained it by lifting something heavy."  - aggravating factors: lifting, overhead reaching, cooking ("mixing something"), pushing through R arm  - Easing factors: bracing  PRECAUTIONS: s/p R ACLR (  [facility] Protocol, refer to media tab for details)  Phase 1 0-6 weeks post op: WBAT, goal is to be without assist by POD10 Knee immobilizer for 1 week with sleep, may be weaned during ambulation once able to do SLR without lag ROM as tolerated, goals of full extension by week 2, 120 deg flexion by week 6  Exercises: extension board and prone hang with ankle weights up to 10lbs. Stationary bike with no resistance for knee flexion (alter set height as ROM increases). Patellar mobilization 5-10 min daily. Quad sets, SLR with knee locked in extension. Begin closed chain work (0-45 deg) when FWB. No restrictions on ankle and hip strengthening.   Phase 2 6-12 weeks post op:  Full WB, no brace  No ROM limitation, increase as tolerated  Ice as much as possible, after therapy at minimum  Exercises: increase CC activities (0-90 deg), add pulley weights, therabands, etc;  monitor for anterior knee pain symptoms. Add core strengthening exercises. Add side lunges and/or slideboard. Continue stationary bike and outdoor biking for ROM, strengthening and cardio. Swimming and water based controlled activities can begin. D/C to HEP if independent.    WEIGHT BEARING RESTRICTIONS: No  FALLS:  Has patient fallen in last 6 months? 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 Mild bruising about lateral incision. No erythema or excessive warmth  PALPATION: No TTP quad or surgical site, patellar mobility is confounded by edema but appears grossly WNL, no pain  UE AROM - R elbow WNL all directions  Minimal pain with end range PROM elbow flexion   UE MMT - R elbow WNL all directions   Palpation:  Tenderness to palpation at distal tricep insertion, lateral elbow   LOWER EXTREMITY ROM:     Active  Right eval Left eval Left 09/04/2022 Left 09/20/2022 Left 11/28/22  Hip flexion       Hip extension       Hip internal rotation       Hip external rotation       Knee extension 0 deg Lacking 5 deg 3 1 0  Knee flexion 130 deg 80 deg (Able to achieve 90 deg post HEP) 118 122 125  (Blank rows = not tested) (Key: WFL = within functional limits not formally  assessed, * = concordant pain, s = stiffness/stretching sensation, NT = not tested)  Comments: above is painless  LOWER EXTREMITY MMT:    MMT Right eval Left eval  Hip flexion    Hip abduction (modified sitting)    Hip internal rotation    Hip external rotation    Knee flexion 5 5  Knee extension 4+ 5  Ankle dorsiflexion     (Blank rows = not tested) (Key: WFL = within functional limits not formally assessed, * =  concordant pain, s = stiffness/stretching sensation, NT = not tested)  Comments: deferred on eval given recency of surgery  LOWER EXTREMITY SPECIAL TESTS:  Deferred given recency of surgery  FUNCTIONAL TESTS:  Sit to stand transfer: weight shift towards R, gentle UE support, mild anterior placement of LLE    GAIT: Distance walked: within clinic Assistive device utilized: None Level of assistance: Complete Independence Comments: antalgic gait LLE, reduced stance time and mildly increased trunk sway    TODAY'S TREATMENT:    [facility] Adult PT Treatment:                                                DATE: 11/28/2022  Therapeutic Exercise: Treadmill forward, 3% incline with self-selected pace x 5 minutes Treadmill backward walking, 3% x 3 minutes @ 0.8 mph  Therapeutic Activity:  Reassessment of objective measures and subjective assessment regarding progress towards established goals and plan for independence with prescribed home program following discharged from PT                                                                                                                            PATIENT EDUCATION: Education details: Pt education on PT impairments, prognosis, and POC. Informed consent. Rationale for interventions, safe/appropriate HEP performance, safety w/ activity, icing  Person educated: Patient Education method: Explanation, Demonstration, Tactile cues, Verbal cues, and Handouts Education comprehension: verbalized understanding, returned demonstration,  verbal cues required, tactile cues required, and needs further education    HOME EXERCISE PROGRAM: Access Code: [email or web address] URL: [email or web address] Date: 10/24/2022 Prepared by: Mauri Reading  Exercises - Active Straight Leg Raise with Quad Set  - 1 x daily - 7 x weekly - 2 sets - 10-15 reps - Sidelying Hip Abduction  - 1 x daily - 7 x weekly - 2 sets - 10-15 reps - Prone Hip Extension  - 1 x daily - 7 x weekly - 2 sets - 10-15 reps - Sidelying Hip Adduction  - 1 x daily - 7 x weekly - 2 sets - 10-15 reps - Wall Quarter Squat with Swiss Ball  - 1 x daily - 7 x weekly - 2 sets - 10 reps - Single Leg Sit to Stand with Arms Crossed  - 1 x daily - 7 x weekly - 2 sets - 10 reps - Seated Isometric Elbow Flexion  - 1 x daily - 7 x weekly - 1 sets - 10 reps - 5 sec hold - Seated Isometric Elbow Extension  - 1 x daily - 7 x weekly - 1 sets - 10 reps - 5 sec hold  ASSESSMENT:  CLINICAL IMPRESSION: Patient has attended 16 total PT sessions primarily related to L ACL reconstruction procedure 14 weeks ago. She has met all established goals for L knee AROM, L LE strength, and overall activity  tolerance. She endorses return to PLOF with mild pain occurring with >2 hours of activity. She was also evaluated and treated for Rt elbow pain, which as most resolved with normal activities. She has met all established goals at this time, is independent with home exercise program, and will be discharged from skilled PT at this time.     OBJECTIVE IMPAIRMENTS: Abnormal gait, decreased activity tolerance, decreased endurance, decreased mobility, difficulty walking, decreased ROM, decreased strength, increased edema, impaired sensation, improper body mechanics, postural dysfunction, and pain.   ACTIVITY LIMITATIONS: carrying, lifting, bending, standing, squatting, stairs, transfers, and locomotion level  PARTICIPATION LIMITATIONS: meal prep, cleaning, laundry, community activity, and  occupation  PERSONAL FACTORS: Past/current experiences and Time since onset of injury/illness/exacerbation are also affecting patient's functional outcome.   REHAB POTENTIAL: Good  CLINICAL DECISION MAKING: Evolving/moderate complexity  EVALUATION COMPLEXITY: Low   GOALS: Goals reviewed with patient? No  SHORT TERM GOALS: Target date: 10/15/2022 Pt will demonstrate appropriate understanding and performance of initially prescribed HEP in order to facilitate improved independence with management of symptoms.  Baseline: HEP provided on eval Goal status: MET  2. Pt will score greater than or equal to 65 on FOTO in order to demonstrate improved perception of function due to symptoms.  Baseline: 63  11/28/22: 69  Goal status: MET  LONG TERM GOALS: Target date: 11/26/2022  Pt will score 68 or greater on FOTO in order to demonstrate improved perception of function due to symptoms.  Baseline: 63 11/28/22: 69 Goal status: MET  2.  Pt will demonstrate at least 0-120 degrees of left knee AROM in order to facilitate improved tolerance to functional movements such as walking, stair navigation, and transfers.  Baseline: see ROM chart above Goal status: MET as of 11/28/22, see objective measure   3.  Pt will demonstrate grossly symmetrical knee extension strength via MMT or handheld dynamometer in order to facilitate improved functional kinematics and maximize outcomes. Baseline: deferred given surgical acuity Goal status: MET as of 11/28/22, see objective measures   4.  Pt will be able to perform 5 consecutive sit to stands without UE support and grossly WNL mechanics to improve safety w/ transfers.  Baseline: UE support for transfers w/ reduced WB through surgical limb 11/28/22: no difficulty Goal status: MET  5. Pt will report ability to stand/walk for up to 1hr in order to maximize tolerance to community navigation and household tasks.  Baseline:  altered mechanics, increased swelling with  standing/walking  11/28/22: Able to tolerate up to 2-3 hrs of standing activities; shorter if moving/working in a small space  Goal status: MET  6. Pt will be able to safely navigate up to 5 stairs without UE support in order to facilitate improved independence with home access.   Baseline: altered mechanics, rail use  Goal status: MET; use of railing as needed as of 11/28/22  7. Pt will have decreased QuickDASH score by at least 12%.  Baseline (10/24/22): 22.7 / 100 = 22.7 % 11/28/22: 4.5% Goal Status: MET  PLAN:  PT FREQUENCY: 2x/week  PT DURATION: 4 weeks as of 10/24/22  PLANNED INTERVENTIONS: Therapeutic exercises, Therapeutic activity, Neuromuscular re-education, Balance training, Gait training, Patient/Family education, Self Care, Joint mobilization, Stair training, Aquatic Therapy, Dry Needling, Electrical stimulation, Cryotherapy, Moist heat, scar mobilization, Taping, Manual therapy, and Re-evaluation  PLAN FOR NEXT SESSION: last updated 10/03/22 per surgeon's protocol -   Exercises: increase CC activities (0-90 deg), add pulley weights, therabands, etc; monitor for anterior knee pain symptoms.  Add core strengthening exercises. Add side lunges and/or slideboard. Continue stationary bike and outdoor biking for ROM, strengthening and cardio. Swimming and water based controlled activities can begin. D/C to HEP if independent.    Mauri Reading PT, DPT 11/28/22  2:46 PM

## 2022-11-29 ENCOUNTER — Ambulatory Visit: Payer: Medicaid Other

## 2022-12-03 LAB — CYTOLOGY - PAP
Chlamydia: NEGATIVE
Comment: NEGATIVE
Comment: NEGATIVE
Comment: NEGATIVE
Comment: NEGATIVE
Comment: NORMAL
Diagnosis: NEGATIVE
High risk HPV: NEGATIVE
Neisseria Gonorrhea: NEGATIVE

## 2022-12-04 ENCOUNTER — Encounter: Payer: Self-pay | Admitting: Hematology and Oncology

## 2022-12-11 ENCOUNTER — Other Ambulatory Visit (HOSPITAL_COMMUNITY): Payer: Self-pay

## 2022-12-11 MED ORDER — WEGOVY 1 MG/0.5ML ~~LOC~~ SOAJ
1.0000 mg | SUBCUTANEOUS | 0 refills | Status: DC
  Filled 2022-12-11 – 2022-12-12 (×3): qty 2, 28d supply, fill #0

## 2022-12-11 MED ORDER — WEGOVY 1.7 MG/0.75ML ~~LOC~~ SOAJ
1.7000 mg | SUBCUTANEOUS | 0 refills | Status: DC
  Filled 2023-01-03: qty 3, 28d supply, fill #0

## 2022-12-12 ENCOUNTER — Other Ambulatory Visit (HOSPITAL_COMMUNITY): Payer: Self-pay

## 2022-12-19 ENCOUNTER — Ambulatory Visit
Admission: RE | Admit: 2022-12-19 | Discharge: 2022-12-19 | Disposition: A | Discharge status: Home / Self Care | Payer: Medicaid Other | Source: Ambulatory Visit | Attending: Adult Health | Admitting: Adult Health

## 2022-12-19 DIAGNOSIS — Z1231 Encounter for screening mammogram for malignant neoplasm of breast: Secondary | ICD-10-CM

## 2022-12-24 ENCOUNTER — Other Ambulatory Visit: Payer: Self-pay | Admitting: Adult Health

## 2022-12-24 DIAGNOSIS — R928 Other abnormal and inconclusive findings on diagnostic imaging of breast: Secondary | ICD-10-CM

## 2023-01-02 ENCOUNTER — Ambulatory Visit
Admission: RE | Admit: 2023-01-02 | Discharge: 2023-01-02 | Disposition: A | Discharge status: Home / Self Care | Payer: Medicaid Other | Source: Ambulatory Visit | Attending: Adult Health | Admitting: Adult Health

## 2023-01-02 ENCOUNTER — Ambulatory Visit: Payer: Medicaid Other

## 2023-01-02 DIAGNOSIS — R928 Other abnormal and inconclusive findings on diagnostic imaging of breast: Secondary | ICD-10-CM

## 2023-01-03 ENCOUNTER — Other Ambulatory Visit (HOSPITAL_COMMUNITY): Payer: Self-pay

## 2023-01-22 ENCOUNTER — Other Ambulatory Visit: Payer: Self-pay

## 2023-01-22 ENCOUNTER — Ambulatory Visit: Payer: Medicaid Other | Attending: Physician Assistant

## 2023-01-22 DIAGNOSIS — M5416 Radiculopathy, lumbar region: Secondary | ICD-10-CM | POA: Insufficient documentation

## 2023-01-22 NOTE — Therapy (Signed)
OUTPATIENT PHYSICAL THERAPY EVALUATION   Patient Name: Kathy White MRN: [id number] DOB:1980-06-16, 42 y.o., female Today's Date: 01/22/2023  END OF SESSION:  PT End of Session - 01/22/23 1530      Visit Number 1    Number of Visits 13    Date for PT Re-Evaluation 03/05/23     Authorization Type [place] medicaid healthy blue     Authorization Time Period     PT Start Time 1535    PT Stop Time 1615    PT Time Calculation (min) 40 min     Activity Tolerance     Behavior During Therapy WFL for tasks assessed/performed     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   Hematuria 04/21/2013   History of abnormal cervical Pap smear    History of HPV infection    Hypertension    IBS (irritable bowel syndrome)    Left ACL tear    Migraine    OA (osteoarthritis) of knee    Obesity    Pain of joint of left ankle and foot 10/08/2019   PCO (polycystic ovaries)    Pelvic pain 09/16/2018   S/P ACL reconstruction 03/22/2015   Stiffness of left knee 09/25/2021   Vaginal Pap smear, abnormal    Vitamin D deficiency    Past Surgical History:  Procedure Laterality Date   BREAST BIOPSY  08/07/2022   MM RT RADIOACTIVE SEED LOC MAMMO GUIDE 08/07/2022 [facility]MO[facility]   BREAST LUMPECTOMY WITH RADIOACTIVE SEED LOCALIZATION Right 08/08/2022   Procedure: RIGHT BREAST LUMPECTOMY WITH RADIOACTIVE SEED LOCALIZATION;  Surgeon: Griselda Miner, MD;  Location: [facility];  Service: General;  Laterality: Right;   CARPAL TUNNEL RELEASE Right 04/28/2021    CERVICAL BIOPSY  W/ LOOP ELECTRODE EXCISION     CESAREAN SECTION  11/14/2004   EXAM UNDER ANESTHESIA WITH MANIPULATION OF KNEE Left 03/22/2015   Procedure: LEFT KNEE EXAM UNDER ANESTHESIA  ;  Surgeon: Eugenia Mcalpine, MD;  Location: [facility] [place];  Service: Orthopedics;  Laterality: Left;   HAND WOUND EXPLORATION AND TENDON REPAIR'S Right 10/12/2014   right ring and index fingers   KNEE ARTHROSCOPY W/ PARTIAL MEDIAL MENISCECTOMY Left 09/23/2021   KNEE ARTHROSCOPY WITH ANTERIOR CRUCIATE LIGAMENT (ACL) REPAIR WITH HAMSTRING GRAFT Left 03/22/2015   Procedure: LEFT KNEE ARTHROSCOPY WITH ANTERIOR CRUCIATE LIGAMENT (ACL) AUTOGRAFT RECONSTRUCTION;  Surgeon: Eugenia Mcalpine, MD;  Location: [facility] [place];  Service: Orthopedics;  Laterality: Left;  ANESTHESIA: GENERAL/ABDUCTOR CANAL BLOCK   KNEE SURGERY  09/2021   LAPAROSCOPIC CHOLECYSTECTOMY  12/20/2004   LAPAROSCOPIC GASTRIC BAND REMOVAL WITH LAPAROSCOPIC GASTRIC SLEEVE RESECTION  09/09/2017   MENISECTOMY Left 03/22/2015   Procedure: PARTIAL LEFT KNEE  LATERAL MENISECTOMY, CHONDROPLASTY;  Surgeon: Eugenia Mcalpine, MD;  Location: [facility] [place];  Service: Orthopedics;  Laterality: Left;   NERVE, TENDON AND ARTERY REPAIR Right 10/12/2014   Procedure: RIGHT RING FINGER AND RIGHT SMALL FINGER WOUND EXPLORATION WITH TENDON REPAIRS;  Surgeon: Bradly Bienenstock, MD;  Location: [facility];  Service: Orthopedics;  Laterality: Right;   SYNOVECTOMY Left 08/23/2022   Procedure: SYNOVECTOMY;  Surgeon: Bjorn Pippin, MD;  Location: [facility];  Service: Orthopedics;  Laterality: Left;   WISDOM TOOTH EXTRACTION     Patient Active Problem List   Diagnosis Date Noted   Encounter for screening fecal occult blood testing 11/27/2022   Routine general medical examination at a health care facility 11/27/2022   Encounter for routine gynecological examination with Papanicolaou smear of cervix 11/27/2022   Atypical ductal  hyperplasia of right breast 11/27/2022   Genetic testing 08/14/2022   Family history of uterine cancer 08/02/2022   Family history of BRCA1 gene positive 08/02/2022   Dysuria 06/11/2022   Burning with urination 05/22/2022   Urinary frequency 05/22/2022   Gastro-esophageal reflux disease without esophagitis 03/08/2022   Neuropathy 03/08/2022   Generalized abdominal pain 03/07/2022   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   Right facial numbness 01/11/2021   Carpal tunnel syndrome of right wrist 01/05/2021   Numbness of hand 01/05/2021   Mass of upper outer quadrant of right breast 07/21/2020   Pain of joint of left ankle and foot 10/08/2019   Arthritis of both knees 10/02/2019   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   Prediabetes 05/30/2017   Vitamin D deficiency 05/30/2017   PCOS (polycystic ovarian syndrome) 01/11/2016   Fibroid 09/15/2015   S/P ACL reconstruction 03/22/2015   Hypertension 03/31/2013   IUD (intrauterine device) in place 02/26/2013   FH: breast cancer in first degree relative 02/26/2013   Leg edema 11/21/2012   Migraine 07/31/2012    PCP: Dois Davenport, MD   REFERRING PROVIDER: Dois Davenport, MD   REFERRING DIAG: L Sided sciatica   Rationale for Evaluation and Treatment: Rehabilitation  THERAPY DIAG:  Radiculopathy, lumbar region  ONSET DATE: 01/03/23  SUBJECTIVE:                                                                                                                                                                                           SUBJECTIVE STATEMENT: Patient reports to PT with Low Back Pain and radiating pain to R LE. She states that the pain radiates from L hip/glute to back and side of mid to upper calf area. She states that her referring provider increased Gabapentin which has helped her symptoms. She has a hard time with sleeping on her left side, and increased left weight bearing while seated. She feels  that it was aggravated 3 weeks ago after wearing wedge heels.   PERTINENT HISTORY:  Relevant PMHx includes L ACL reconstruction with meniscectomy,  fibroids, HTN, IBS, migraines, knee OA, obesity, PCOS     PAIN:  Are you having pain? Yes: NPRS scale: 1/10 current, 10/10 at worst  Pain location: lower back, LE  Pain description: intermittent numbness/tingling, burning ("like someone has a hot iron and is twisting it, aching)  Aggravating factors: prolonged sitting, laying on Left side, prolonged walking, sitting with more weight on Left side.  Relieving factors: gabapentin, ibuprofen, laying on Rt side   PRECAUTIONS: None  RED FLAGS: None   WEIGHT BEARING RESTRICTIONS: No  FALLS:  Has patient fallen in last 6 months? Yes. Number of falls 2, slipping in bath tub and once slipping on her dog's pee   LIVING ENVIRONMENT: Mobile home, 5 STE with 1 rail Lives w/ 41 y.o son and his father Pt does majority of housework   OCCUPATION: works as an Psychologist, clinical to elderly gentleman    PLOF: Independent  PATIENT GOALS: Patient would like to have less back and leg pain  NEXT MD VISIT: none scheduled with referring provider at this time  OBJECTIVE:  Note: Objective measures were completed at Evaluation unless otherwise noted.  DIAGNOSTIC FINDINGS:  03/24/22 IMPRESSION: MRI scan lumbar spine without contrast showing only mild disc degenerative change at L3-4 and prominent posterior facet degenerative changes without significant compression.   PATIENT SURVEYS:  Modified Oswestry 24%  Current activity limitations including lifting, sleeping, prolonged sitting, and normal homemaking/job activities  COGNITION: Overall cognitive status: Within functional limits for tasks assessed     SENSATION: Not tested  POSTURE: No Significant postural limitations  LUMBAR ROM:   AROM eval  Flexion 100%  Extension 100%  Right lateral flexion 100%  Left lateral flexion 100%  Right rotation 100%   Left rotation 100%   (Blank rows = not tested)  LOWER EXTREMITY MMT:    MMT Right eval Left eval  Hip flexion    Hip extension    Hip abduction    Hip adduction    Hip internal rotation    Hip external rotation    Knee flexion    Knee extension    Ankle dorsiflexion    Ankle plantarflexion    Ankle inversion    Ankle eversion     (Blank rows = not tested)  LUMBAR SPECIAL TESTS:  Straight leg raise test: Positive and Slump test: Positive   TODAY'S TREATMENT:                                                                                                                               [facility] Adult PT Treatment:                                                DATE: 01/22/2023  Initial evaluation: see patient education and home exercise program as noted below  PATIENT EDUCATION:  Education details: reviewed initial home exercise program; discussion of POC, prognosis and goals for skilled PT   Person educated: Patient Education method: Explanation, Demonstration, and Handouts Education comprehension: verbalized understanding, returned demonstration, and needs further education  HOME EXERCISE PROGRAM: Access Code: [email or web address] URL: [email or web address] Date: 01/22/2023 Prepared by: Mauri Reading  Exercises - Prone Press Up On Elbows  - 1 x daily - 7 x weekly - 3 sets - 30 sec hold - Supine Sciatic Nerve Glide  - 1 x daily - 7 x weekly - 2 sets - 10 reps - 3 sec hold - Standing Lumbar Extension at Wall - Forearms  - 1 x daily - 7 x weekly - 2 sets - 10 reps - 3 sec hold  ASSESSMENT:  CLINICAL IMPRESSION: Talor is a 42 y.o. female  who was seen today for physical therapy evaluation and treatment for Low Back Pain with radiating symptoms to Left LE. She is demonstrating increased LE neural tension, consistent with lumbar radiculopathy presentation, that responds well to lumbar extension activities. She has related pain and difficulty with prolonged sitting,  lifting, sleep quality and performance of normal homemaking and caregiver tasks. She requires skilled PT services at this time to address relevant deficits and improve overall function.     OBJECTIVE IMPAIRMENTS: decreased activity tolerance, improper body mechanics, and pain.   ACTIVITY LIMITATIONS: lifting, bending, sitting, standing, sleeping, and caring for others  PARTICIPATION LIMITATIONS: cleaning, interpersonal relationship, community activity, and occupation  PERSONAL FACTORS: Past/current experiences and Time since onset of injury/illness/exacerbation are also affecting patient's functional outcome.   REHAB POTENTIAL: Fair    CLINICAL DECISION MAKING: Stable/uncomplicated  EVALUATION COMPLEXITY: Low   GOALS: Goals reviewed with patient? Yes  SHORT TERM GOALS: Target date: 02/12/2023   Patient will be independent with initial home program for exercises to promote centralization of symptoms.  Baseline: provided at eval  Goal status: INITIAL    LONG TERM GOALS: Target date: 03/05/2023   Patient will report improved overall functional ability with modified ODI score of 12%  Baseline: 24% Goal status: INITIAL  2.  Patient will report ability to sleep through the night without waking d/t back, nor LE pain.  Baseline: Moderate difficulty/somewhat disrupted sleep  Goal status: INITIAL  3.  Patient will report ability to sit for 1 hour without onset of radicular symptoms.  Baseline: unable Goal status: INITIAL  4.  Patient will demonstrate ability to perform floor to waist lifting of at least 25# using appropriate body mechanics and with no more than minimal pain in order to safely perform normal daily/occupational tasks.   Baseline: minimal-to-moderate pain and difficulty  Goal status: INITIAL  5.  Patient will report ability to perform all normal daily household and occupational activities without exacerbation of back pain, nor radicular symptoms.  Baseline:  minimal-to-moderate pain and difficulty  Goal status: INITIAL   PLAN:  PT FREQUENCY: 1-2x/week  PT DURATION: 6 weeks  PLANNED INTERVENTIONS: 97164- PT Re-evaluation, 97110-Therapeutic exercises, 97530- Therapeutic activity, 97112- Neuromuscular re-education, 97535- Self Care, 54098- Manual therapy, [id number]- Electrical stimulation (manual), Taping, Dry Needling, Joint mobilization, Spinal manipulation, Spinal mobilization, Cryotherapy, and Moist heat.  PLAN FOR NEXT SESSION: lumbar extension-based activities (sustained hold => repeated motions), dynamic core strengthening activities, nerve glides    Mauri Reading, PT, DPT   01/22/2023, 3:32 PM

## 2023-01-30 ENCOUNTER — Ambulatory Visit: Payer: Medicaid Other

## 2023-02-06 ENCOUNTER — Ambulatory Visit: Payer: Medicaid Other

## 2023-02-13 ENCOUNTER — Other Ambulatory Visit (HOSPITAL_COMMUNITY): Payer: Self-pay

## 2023-02-13 ENCOUNTER — Ambulatory Visit: Payer: Medicaid Other

## 2023-02-13 MED ORDER — WEGOVY 2.4 MG/0.75ML ~~LOC~~ SOAJ
2.4000 mg | SUBCUTANEOUS | 0 refills | Status: DC
  Filled 2023-02-13: qty 9, 84d supply, fill #0

## 2023-02-20 ENCOUNTER — Ambulatory Visit: Payer: Medicaid Other | Attending: Physician Assistant

## 2023-02-20 DIAGNOSIS — M5416 Radiculopathy, lumbar region: Secondary | ICD-10-CM | POA: Diagnosis present

## 2023-02-20 NOTE — Therapy (Signed)
OUTPATIENT PHYSICAL THERAPY EVALUATION   Patient Name: Kathy White MRN: [id number] DOB:10-23-1980, 42 y.o., female Today's Date: 02/20/2023  PHYSICAL THERAPY DISCHARGE SUMMARY  Visits from Start of Care: 2  Current functional level related to goals / functional outcomes: See objective findings/assessment    Remaining deficits: See objective findings/assessment    Education / Equipment: See today's treatment/assessment      Patient agrees to discharge. Patient goals were partially met. Patient is being discharged due to being pleased with the current functional level.   END OF SESSION:  PT End of Session - 02/20/23 1454      Visit Number 2    Number of Visits 13    Date for PT Re-Evaluation 03/05/23     Authorization Type [place] medicaid [employer]     Authorization Time Period     PT Start Time 1455    PT Stop Time 1525    PT Time Calculation (min) 30 min     Activity Tolerance Patient tolerated treatment well    Behavior During Therapy WFL for tasks assessed/performed     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   Hematuria 04/21/2013   History of abnormal cervical Pap smear    History of HPV infection    Hypertension    IBS (irritable bowel syndrome)    Left ACL tear    Migraine    OA (osteoarthritis) of knee    Obesity    Pain of joint of left ankle and foot 10/08/2019   PCO (polycystic ovaries)    Pelvic pain 09/16/2018   S/P ACL reconstruction 03/22/2015   Stiffness of left knee 09/25/2021   Vaginal Pap smear,  abnormal    Vitamin D deficiency    Past Surgical History:  Procedure Laterality Date   BREAST BIOPSY  08/07/2022   MM RT RADIOACTIVE SEED LOC MAMMO GUIDE 08/07/2022 [facility]MO[facility]   BREAST LUMPECTOMY WITH RADIOACTIVE SEED LOCALIZATION Right 08/08/2022   Procedure: RIGHT BREAST LUMPECTOMY WITH RADIOACTIVE SEED LOCALIZATION;  Surgeon: Griselda Miner, MD;  Location: [facility];  Service: General;  Laterality: Right;   CARPAL TUNNEL RELEASE Right 04/28/2021   CERVICAL BIOPSY  W/ LOOP ELECTRODE EXCISION     CESAREAN SECTION  11/14/2004   EXAM UNDER ANESTHESIA WITH MANIPULATION OF KNEE Left 03/22/2015   Procedure: LEFT KNEE EXAM UNDER ANESTHESIA  ;  Surgeon: Eugenia Mcalpine, MD;  Location: [facility];  Service: Orthopedics;  Laterality: Left;   HAND WOUND EXPLORATION AND TENDON REPAIR'S Right 10/12/2014   right ring and index fingers   KNEE ARTHROSCOPY W/ PARTIAL MEDIAL MENISCECTOMY Left 09/23/2021   KNEE ARTHROSCOPY WITH ANTERIOR CRUCIATE LIGAMENT (ACL) REPAIR WITH HAMSTRING GRAFT Left 03/22/2015   Procedure: LEFT KNEE ARTHROSCOPY WITH ANTERIOR CRUCIATE LIGAMENT (ACL) AUTOGRAFT RECONSTRUCTION;  Surgeon: Eugenia Mcalpine, MD;  Location: [facility];  Service: Orthopedics;  Laterality: Left;  ANESTHESIA: GENERAL/ABDUCTOR CANAL BLOCK   KNEE SURGERY  09/2021   LAPAROSCOPIC CHOLECYSTECTOMY  12/20/2004  LAPAROSCOPIC GASTRIC BAND REMOVAL WITH LAPAROSCOPIC GASTRIC SLEEVE RESECTION  09/09/2017   MENISECTOMY Left 03/22/2015   Procedure: PARTIAL LEFT KNEE  LATERAL MENISECTOMY, CHONDROPLASTY;  Surgeon: Eugenia Mcalpine, MD;  Location: [facility];  Service: Orthopedics;  Laterality: Left;   NERVE, TENDON AND ARTERY REPAIR Right 10/12/2014   Procedure: RIGHT RING FINGER AND RIGHT SMALL FINGER WOUND EXPLORATION WITH TENDON REPAIRS;  Surgeon: Bradly Bienenstock, MD;  Location: [facility];  Service: Orthopedics;  Laterality: Right;   SYNOVECTOMY Left 08/23/2022    Procedure: SYNOVECTOMY;  Surgeon: Bjorn Pippin, MD;  Location: [facility];  Service: Orthopedics;  Laterality: Left;   WISDOM TOOTH EXTRACTION     Patient Active Problem List   Diagnosis Date Noted   Encounter for screening fecal occult blood testing 11/27/2022   Routine general medical examination at a health care facility 11/27/2022   Encounter for routine gynecological examination with Papanicolaou smear of cervix 11/27/2022   Atypical ductal hyperplasia of right breast 11/27/2022   Genetic testing 08/14/2022   Family history of uterine cancer 08/02/2022   Family history of BRCA1 gene positive 08/02/2022   Dysuria 06/11/2022   Burning with urination 05/22/2022   Urinary frequency 05/22/2022   Gastro-esophageal reflux disease without esophagitis 03/08/2022   Neuropathy 03/08/2022   Generalized abdominal pain 03/07/2022   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   Right facial numbness 01/11/2021   Carpal tunnel syndrome of right wrist 01/05/2021   Numbness of hand 01/05/2021   Mass of upper outer quadrant of right breast 07/21/2020   Pain of joint of left ankle and foot 10/08/2019   Arthritis of both knees 10/02/2019   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   Prediabetes 05/30/2017   Vitamin D deficiency 05/30/2017   PCOS (polycystic ovarian syndrome) 01/11/2016   Fibroid 09/15/2015   S/P ACL reconstruction 03/22/2015   Hypertension 03/31/2013   IUD (intrauterine device) in place 02/26/2013   FH: breast cancer in first degree relative 02/26/2013   Leg edema 11/21/2012   Migraine 07/31/2012    PCP: Dois Davenport, MD   REFERRING PROVIDER: Dois Davenport, MD   REFERRING DIAG: L Sided sciatica   Rationale for Evaluation and Treatment: Rehabilitation  THERAPY DIAG:  Radiculopathy, lumbar region  ONSET DATE: 01/03/23  SUBJECTIVE:                                                                                                                                                                                            SUBJECTIVE STATEMENT: Patient reports that her Low Back Pain is 0/10. She has been working on [employer] and is  unable to identify any current functional deficits. Reports readiness for discharge.    PERTINENT HISTORY:  Relevant PMHx includes L ACL reconstruction with meniscectomy, fibroids, HTN, IBS, migraines, knee OA, obesity, PCOS     PAIN:  Are you having pain? Yes: NPRS scale: 0/10 Pain location: lower back, LE  Pain description: intermittent numbness/tingling, burning ("like someone has a hot iron and is twisting it, aching)  Aggravating factors: prolonged sitting, laying on Left side, prolonged walking, sitting with more weight on Left side.  Relieving factors: gabapentin, ibuprofen, laying on Rt side   PRECAUTIONS: None  RED FLAGS: None   WEIGHT BEARING RESTRICTIONS: No  FALLS:  Has patient fallen in last 6 months? Yes. Number of falls 2, slipping in bath tub and once slipping on her dog's pee   LIVING ENVIRONMENT: Mobile home, 5 STE with 1 rail Lives w/ 84 y.o son and his father Pt does majority of housework   OCCUPATION: works as an Psychologist, clinical to elderly gentleman    PLOF: Independent  PATIENT GOALS: Patient would like to have less back and leg pain  NEXT MD VISIT: none scheduled with referring provider at this time  OBJECTIVE:  Note: Objective measures were completed at Evaluation unless otherwise noted.  DIAGNOSTIC FINDINGS:  03/24/22 IMPRESSION: MRI scan lumbar spine without contrast showing only mild disc degenerative change at L3-4 and prominent posterior facet degenerative changes without significant compression.   PATIENT SURVEYS:  Modified Oswestry 24%  Current activity limitations including lifting, sleeping, prolonged sitting, and normal homemaking/job activities  COGNITION: Overall cognitive status: Within functional limits for tasks assessed     SENSATION: Not  tested  POSTURE: No Significant postural limitations  LUMBAR ROM:   AROM eval  Flexion 100%  Extension 100%  Right lateral flexion 100%  Left lateral flexion 100%  Right rotation 100%  Left rotation 100%   (Blank rows = not tested)  LOWER EXTREMITY MMT:    MMT Right eval Left eval  Hip flexion    Hip extension    Hip abduction    Hip adduction    Hip internal rotation    Hip external rotation    Knee flexion    Knee extension    Ankle dorsiflexion    Ankle plantarflexion    Ankle inversion    Ankle eversion     (Blank rows = not tested)  LUMBAR SPECIAL TESTS:  Straight leg raise test: Positive and Slump test: Positive   TODAY'S TREATMENT:                                                                                                                               [facility] Adult PT Treatment:  DATE: 02/20/2023  Therapeutic Activity:  Reassessment of objective measures and subjective assessment regarding progress towards established goals and plan for independence with prescribed home program following discharged from PT   [facility] Adult PT Treatment:                                                DATE: 01/22/2023  Initial evaluation: see patient education and home exercise program as noted below    PATIENT EDUCATION:  Education details: reviewed initial home exercise program; discussion of POC, prognosis and goals for skilled PT   Person educated: Patient Education method: Explanation, Demonstration, and Handouts Education comprehension: verbalized understanding, returned demonstration, and needs further education  HOME EXERCISE PROGRAM: Access Code: [email or web address] URL: [email or web address] Date: 02/20/2023 Prepared by: Mauri Reading  Exercises - Prone Press Up On Elbows  - 1 x daily - 7 x weekly - 3 sets - 30 sec hold - Supine Sciatic Nerve Glide  - 1 x daily - 7 x weekly - 2 sets - 10 reps - 3 sec hold -  Standing Lumbar Extension at Wall - Forearms  - 1 x daily - 7 x weekly - 2 sets - 10 reps - 3 sec hold - Supine Dead Bug with Leg Extension  - 1 x daily - 7 x weekly - 2 sets - 10 reps - Bridge with Hamstring Curl on Swiss Ball  - 1 x daily - 7 x weekly - 2-3 sets - 10 reps - Standing Cable Chops  - 1 x daily - 7 x weekly - 2-3 sets - 10 reps - Anti-Rotation Sidestepping with Resistance  - 1 x daily - 7 x weekly - 2-3 sets - 10 reps - Standing Anti-Rotation Press with Anchored Resistance  - 1 x daily - 7 x weekly - 2-3 sets - 10 reps  ASSESSMENT:  CLINICAL IMPRESSION: Today is patient's first session since initial evaluation. She is reporting significant improvement of symptoms and feels that she has no current functional limitations. She intends to continue with HEP for independent management as indicated. She will be discharged from skilled PT at this time and should return to care of referring provider.    OBJECTIVE IMPAIRMENTS: decreased activity tolerance, improper body mechanics, and pain.   ACTIVITY LIMITATIONS: lifting, bending, sitting, standing, sleeping, and caring for others  PARTICIPATION LIMITATIONS: cleaning, interpersonal relationship, community activity, and occupation  PERSONAL FACTORS: Past/current experiences and Time since onset of injury/illness/exacerbation are also affecting patient's functional outcome.   REHAB POTENTIAL: Fair    CLINICAL DECISION MAKING: Stable/uncomplicated  EVALUATION COMPLEXITY: Low   GOALS: Goals reviewed with patient? Yes  SHORT TERM GOALS: Target date: 02/12/2023   Patient will be independent with initial home program for exercises to promote centralization of symptoms.  Baseline: provided at eval  Goal status: MET    LONG TERM GOALS: Target date: 03/05/2023   Patient will report improved overall functional ability with modified ODI score of 12%  Baseline: 24% 02/20/23: 16% Goal status: NOT MET  2.  Patient will report  ability to sleep through the night without waking d/t back, nor LE pain.  Baseline: Moderate difficulty/somewhat disrupted sleep  Goal status: MET 02/20/23  3.  Patient will report ability to sit for 1 hour without onset of radicular symptoms.  Baseline: unable 02/20/23: able to sit on couch for 2 hours, but  driving 16-10 minutes Goal status: MET  4.  Patient will demonstrate ability to perform floor to waist lifting of at least 25# using appropriate body mechanics and with no more than minimal pain in order to safely perform normal daily/occupational tasks.   Baseline: minimal-to-moderate pain and difficulty  02/20/23: "I'm able"  Goal status: MET  5.  Patient will report ability to perform all normal daily household and occupational activities without exacerbation of back pain, nor radicular symptoms.  Baseline: minimal-to-moderate pain and difficulty  Goal status: MET 02/20/23   PLAN:  PT FREQUENCY: 1-2x/week  PT DURATION: 6 weeks  PLANNED INTERVENTIONS: 97164- PT Re-evaluation, 97110-Therapeutic exercises, 97530- Therapeutic activity, 97112- Neuromuscular re-education, 97535- Self Care, 96045- Manual therapy, [id number]- Electrical stimulation (manual), Taping, Dry Needling, Joint mobilization, Spinal manipulation, Spinal mobilization, Cryotherapy, and Moist heat.  PLAN FOR NEXT SESSION: independent HEP    Mauri Reading, PT, DPT   02/22/2023, 3:54 PM

## 2023-03-07 ENCOUNTER — Other Ambulatory Visit: Payer: Medicaid Other

## 2023-03-07 ENCOUNTER — Other Ambulatory Visit (HOSPITAL_COMMUNITY)
Admission: RE | Admit: 2023-03-07 | Discharge: 2023-03-07 | Disposition: A | Discharge status: Home / Self Care | Payer: Medicaid Other | Source: Ambulatory Visit | Attending: Obstetrics & Gynecology | Admitting: Obstetrics & Gynecology

## 2023-03-07 DIAGNOSIS — N898 Other specified noninflammatory disorders of vagina: Secondary | ICD-10-CM | POA: Diagnosis present

## 2023-03-07 NOTE — Progress Notes (Signed)
   NURSE VISIT- VAGINITIS/STD/POC  SUBJECTIVE:  Kathy White is a 42 y.o. G1P0101 GYN patientfemale here for a vaginal swab for vaginitis screening.  She reports the following symptoms: local irritation for 2 days. Denies abnormal vaginal bleeding, significant pelvic pain, fever, or UTI symptoms.  OBJECTIVE:  There were no vitals taken for this visit.  Appears well, in no apparent distress  ASSESSMENT: Vaginal swab for vaginitis screening  PLAN: Self-collected vaginal probe for Gonorrhea, Chlamydia, Trichomonas, Bacterial Vaginosis, Yeast sent to lab Treatment: to be determined once results are received Follow-up as needed if symptoms persist/worsen, or new symptoms develop  Jobe Marker  03/07/2023 3:33 PM

## 2023-03-11 LAB — CERVICOVAGINAL ANCILLARY ONLY
Bacterial Vaginitis (gardnerella): NEGATIVE
Candida Glabrata: NEGATIVE
Candida Vaginitis: NEGATIVE
Chlamydia: NEGATIVE
Comment: NEGATIVE
Comment: NEGATIVE
Comment: NEGATIVE
Comment: NEGATIVE
Comment: NEGATIVE
Comment: NORMAL
Neisseria Gonorrhea: NEGATIVE
Trichomonas: NEGATIVE

## 2023-03-14 ENCOUNTER — Ambulatory Visit: Payer: Medicaid Other | Admitting: Adult Health

## 2023-05-14 ENCOUNTER — Other Ambulatory Visit (HOSPITAL_COMMUNITY): Payer: Self-pay

## 2023-05-14 MED ORDER — WEGOVY 2.4 MG/0.75ML ~~LOC~~ SOAJ
2.4000 mg | SUBCUTANEOUS | 0 refills | Status: DC
  Filled 2023-05-14 – 2023-05-22 (×2): qty 3, 28d supply, fill #0
  Filled 2023-05-31: qty 9, 84d supply, fill #0

## 2023-05-15 ENCOUNTER — Other Ambulatory Visit (HOSPITAL_COMMUNITY): Payer: Self-pay

## 2023-05-22 ENCOUNTER — Other Ambulatory Visit (HOSPITAL_COMMUNITY): Payer: Self-pay

## 2023-05-22 ENCOUNTER — Telehealth: Payer: Self-pay | Admitting: Hematology and Oncology

## 2023-05-22 NOTE — Telephone Encounter (Signed)
 Spoke with patient confirming upcoming appointments

## 2023-05-23 ENCOUNTER — Ambulatory Visit (HOSPITAL_COMMUNITY)
Admission: RE | Admit: 2023-05-23 | Discharge: 2023-05-23 | Disposition: A | Discharge status: Home / Self Care | Payer: Medicaid Other | Source: Ambulatory Visit | Attending: Hematology and Oncology | Admitting: Hematology and Oncology

## 2023-05-23 DIAGNOSIS — Z9189 Other specified personal risk factors, not elsewhere classified: Secondary | ICD-10-CM | POA: Insufficient documentation

## 2023-05-23 MED ORDER — GADOBUTROL 1 MMOL/ML IV SOLN
10.0000 mL | Freq: Once | INTRAVENOUS | Status: AC | PRN
  Administered 2023-05-23: 10 mL via INTRAVENOUS

## 2023-05-27 ENCOUNTER — Inpatient Hospital Stay: Payer: Medicaid Other | Admitting: Hematology and Oncology

## 2023-05-31 ENCOUNTER — Other Ambulatory Visit (HOSPITAL_COMMUNITY): Payer: Self-pay

## 2023-06-04 ENCOUNTER — Inpatient Hospital Stay: Admitting: Hematology and Oncology

## 2023-06-10 ENCOUNTER — Telehealth: Payer: Self-pay | Admitting: *Deleted

## 2023-06-11 NOTE — Progress Notes (Unsigned)
 [facility] CONSULT NOTE  Patient Care Team: Dois Davenport, MD as PCP - General (Family Medicine) Jeani Hawking, MD as Consulting Physician (Gastroenterology) Suanne Marker, MD as Consulting Physician (Neurology) Eugenia Mcalpine, MD (Inactive) as Consulting Physician (Orthopedic Surgery) Adline Potter, NP as Nurse Practitioner (Obstetrics and Gynecology)  CHIEF COMPLAINTS/PURPOSE OF CONSULTATION:  ADH of right breast.  ASSESSMENT & PLAN:   This is a very pleasant 43 yr old female patient with strong FH in mom, sister, maternal grandmother with [facility] referred to high risk breast clinic because of ADH Prognosis:Using the TC model calculated, life time risk calculated is around 36%. Assessment & Plan Breast cancer on tamoxifen.  MRI shows surgical changes and bilateral cysts. Considering tamoxifen dose increase due to improved tolerance. She wants to try 10 mg po daily. - Order screening mammogram in October. - Order follow-up MRI in one year. - Increase tamoxifen dose to 10 mg as tolerated.  Obesity Weight reduced from 234 lbs to 201 lbs with OZHYQM. Discontinued carvedilol and Januvia due to improved weight and glycemic control. - Continue Wegovy per PCP  Menstrual irregularities Prolonged menstrual periods due to IUD, previously irregular periods now regulated. - Monitor menstrual cycle and symptoms.  FU in 6 months.  HISTORY OF PRESENTING ILLNESS:  Kathy White 43 y.o. female is here because of ADH of right breast.   At baseline she has some mild arthritis, multiple surgical repairs related to arthritis, hypertension, obesity.  She is currently taking bupropion for weight loss, previously was tried on phentermine.  She has a very strong family history of breast cancer and multiple family members with BRCA1 although she tested negative back when she was 20.  Her mother, her sister as well as her maternal grandmother had breast cancer.   History of  Present Illness    Discussed the use of AI scribe software for clinical note transcription with the patient, who gave verbal consent to proceed.  History of Present Illness Kathy White is a 43 year old female who presents for follow-up regarding breast health and medication management.  She has undergone an MRI which revealed surgical changes in the right breast and bilateral breast cysts. She has not seen her breast surgeon since her surgery. Her mother and sister also have dense breast tissue.  She is currently taking tamoxifen at a dose of 5 mg, which was reduced from 10 mg due to nausea. She finds cutting the pill in half inconvenient and feels her body has adjusted to the medication.  She has discontinued carvedilol and Januvia, as her bariatric doctor indicated that [facility], which she is now on at a dose of 2.4 mg, serves a similar purpose. She has lost approximately 30 pounds since starting Wegovy, going from 234 pounds to 201 pounds.  She experiences leg swelling if standing for long periods, particularly in the leg where she had surgery. No recent hospital visits or pregnancy.  She is still menstruating, with periods lasting 7 to 10 days, sometimes longer, due to an IUD. Her periods are light but persistent, and she has a history of irregular periods before the IUD placement.    MEDICAL HISTORY:  Past Medical History:  Diagnosis Date   Acute meniscal tear of left knee    Allergy    Any narcotics pain medications, adhesive tape   Benign essential HTN 01/13/2014   BRCA negative 09/14/2015   Family history of BRCA1 gene positive    Family history of  breast cancer    Family history of uterine cancer    FH: breast cancer in first degree relative 02/26/2013   Fibroid 09/15/2015   GERD (gastroesophageal reflux disease) 03/12/2018   More frequent after gastric sleeve surgery   Hematuria 04/21/2013   History of abnormal cervical Pap smear    History of HPV infection     Hypertension    IBS (irritable bowel syndrome)    Left ACL tear    Migraine    OA (osteoarthritis) of knee    Obesity    Pain of joint of left ankle and foot 10/08/2019   PCO (polycystic ovaries)    Pelvic pain 09/16/2018   S/P ACL reconstruction 03/22/2015   Stiffness of left knee 09/25/2021   Vaginal Pap smear, abnormal    Vitamin D deficiency     SURGICAL HISTORY: Past Surgical History:  Procedure Laterality Date   BREAST BIOPSY  08/07/2022   MM RT RADIOACTIVE SEED LOC MAMMO GUIDE 08/07/2022 [facility] MAMMOGRAPHY   BREAST LUMPECTOMY WITH RADIOACTIVE SEED LOCALIZATION Right 08/08/2022   Procedure: RIGHT BREAST LUMPECTOMY WITH RADIOACTIVE SEED LOCALIZATION;  Surgeon: Griselda Miner, MD;  Location: [facility];  Service: General;  Laterality: Right;   CARPAL TUNNEL RELEASE Right 04/28/2021   CERVICAL BIOPSY  W/ LOOP ELECTRODE EXCISION     CESAREAN SECTION  11/14/2004   EXAM UNDER ANESTHESIA WITH MANIPULATION OF KNEE Left 03/22/2015   Procedure: LEFT KNEE EXAM UNDER ANESTHESIA  ;  Surgeon: Eugenia Mcalpine, MD;  Location: [facility];  Service: Orthopedics;  Laterality: Left;   HAND WOUND EXPLORATION AND TENDON REPAIR'S Right 10/12/2014   right ring and index fingers   KNEE ARTHROSCOPY W/ PARTIAL MEDIAL MENISCECTOMY Left 09/23/2021   KNEE ARTHROSCOPY WITH ANTERIOR CRUCIATE LIGAMENT (ACL) REPAIR WITH HAMSTRING GRAFT Left 03/22/2015   Procedure: LEFT KNEE ARTHROSCOPY WITH ANTERIOR CRUCIATE LIGAMENT (ACL) AUTOGRAFT RECONSTRUCTION;  Surgeon: Eugenia Mcalpine, MD;  Location: [facility];  Service: Orthopedics;  Laterality: Left;  ANESTHESIA: GENERAL/ABDUCTOR CANAL BLOCK   KNEE SURGERY  09/2021   LAPAROSCOPIC CHOLECYSTECTOMY  12/20/2004   LAPAROSCOPIC GASTRIC BAND REMOVAL WITH LAPAROSCOPIC GASTRIC SLEEVE RESECTION  09/09/2017   MENISECTOMY Left 03/22/2015   Procedure: PARTIAL LEFT KNEE  LATERAL MENISECTOMY, CHONDROPLASTY;  Surgeon: Eugenia Mcalpine, MD;   Location: [facility];  Service: Orthopedics;  Laterality: Left;   NERVE, TENDON AND ARTERY REPAIR Right 10/12/2014   Procedure: RIGHT RING FINGER AND RIGHT SMALL FINGER WOUND EXPLORATION WITH TENDON REPAIRS;  Surgeon: Bradly Bienenstock, MD;  Location: [facility];  Service: Orthopedics;  Laterality: Right;   SYNOVECTOMY Left 08/23/2022   Procedure: SYNOVECTOMY;  Surgeon: Bjorn Pippin, MD;  Location: [facility];  Service: Orthopedics;  Laterality: Left;   WISDOM TOOTH EXTRACTION      SOCIAL HISTORY: Social History   Socioeconomic History   Marital status: Single    Spouse name: Not on file   Number of children: 1   Years of education: Not on file   Highest education level: Bachelor's degree (e.g., BA, AB, BS)  Occupational History   Not on file  Tobacco Use   Smoking status: Never   Smokeless tobacco: Never  Vaping Use   Vaping status: Never Used  Substance and Sexual Activity   Alcohol use: No   Drug use: No   Sexual activity: Yes    Birth control/protection: I.U.D.  Other Topics Concern   Not on file  Social History Narrative   Not  on file   Social Drivers of Health   Financial Resource Strain: Low Risk  (11/27/2022)   Overall Financial Resource Strain (CARDIA)    Difficulty of Paying Living Expenses: Not hard at all  Recent Concern: Financial Resource Strain - Medium Risk (10/16/2022)   Received from [facility]   Overall Financial Resource Strain (CARDIA)    Difficulty of Paying Living Expenses: Somewhat hard  Food Insecurity: No Food Insecurity (11/27/2022)   Hunger Vital Sign    Worried About Running Out of Food in the Last Year: Never true    Ran Out of Food in the Last Year: Never true  Transportation Needs: No Transportation Needs (11/27/2022)   PRAPARE - Administrator, Civil Service (Medical): No    Lack of Transportation (Non-Medical): No  Physical Activity: Insufficiently Active (11/27/2022)   Exercise Vital Sign    Days of  Exercise per Week: 2 days    Minutes of Exercise per Session: 40 min  Stress: Stress Concern Present (11/27/2022)   [employer] of Occupational Health - Occupational Stress Questionnaire    Feeling of Stress : To some extent  Social Connections: Socially Isolated (11/27/2022)   Social Connection and Isolation Panel [NHANES]    Frequency of Communication with Friends and Family: Once a week    Frequency of Social Gatherings with Friends and Family: Once a week    Attends Religious Services: Never    Database administrator or Organizations: No    Attends Banker Meetings: Never    Marital Status: Living with partner  Intimate Partner Violence: Not At Risk (11/27/2022)   Humiliation, Afraid, Rape, and Kick questionnaire    Fear of Current or Ex-Partner: No    Emotionally Abused: No    Physically Abused: No    Sexually Abused: No    FAMILY HISTORY: Family History  Problem Relation Age of Onset   Hypertension Mother    Breast cancer Mother 88   BRCA 1/2 Mother        BRCA1+   Diabetes Father    Hypertension Father    Other Father        open heart surgery   Anxiety disorder Father    Depression Father    Heart disease Father    Stroke Father    Hypertension Sister    Diabetes Sister    Breast cancer Sister 86       bilateral   Stroke Sister    Anxiety disorder Sister    Depression Sister    Heart disease Sister    BRCA 1/2 Sister        BRCA1   Diabetes Sister    Hypertension Sister    Neuropathy Sister    Obesity Sister    Anxiety disorder Sister    Depression Sister    Hypertension Sister    Thyroid disease Sister    Endometrial cancer Sister 61   Obesity Sister    Thyroid disease Brother    BRCA 1/2 Maternal Grandmother        BRCA1 pos   Breast cancer Maternal Grandmother        bilateral, d. 71   Bone cancer Maternal Grandfather    Diabetes Paternal Grandfather    Anxiety disorder Son    Depression Son    Obesity Son    Cancer Maternal  Aunt    Breast cancer Maternal Aunt        bilateral; d. 70   BRCA  1/2 Cousin    Breast cancer Cousin 26       d. 13    ALLERGIES:  is allergic to bee venom, hydrocodone, oxycodone-acetaminophen, amlodipine, attends briefs small, percocet [oxycodone-acetaminophen], and tape.  MEDICATIONS:  Current Outpatient Medications  Medication Sig Dispense Refill   budesonide-formoterol (SYMBICORT) 160-4.5 MCG/ACT inhaler As needed.     Calcium Carb-Cholecalciferol (CALTRATE 600+D3 PO) Take 1 tablet by mouth daily.     cetirizine (ZYRTEC) 10 MG tablet Take by mouth.     cholecalciferol (VITAMIN D) 1000 units tablet Take 4,000 Units by mouth daily.     Cranberry-Vitamin C-Probiotic (AZO CRANBERRY PO) Take 2 capsules by mouth daily in the afternoon.     cyclobenzaprine (FLEXERIL) 10 MG tablet As needed.     famotidine (PEPCID) 40 MG tablet Take 40 mg by mouth daily.     fluticasone (FLONASE) 50 MCG/ACT nasal spray Place 2 sprays into the nose as needed for allergies.      gabapentin (NEURONTIN) 400 MG capsule Take 400 mg by mouth at bedtime.     hydrochlorothiazide (HYDRODIURIL) 25 MG tablet Take 1 tablet by mouth daily.     hydrOXYzine (ATARAX/VISTARIL) 10 MG tablet hydroxyzine HCl 10 mg tablet     ipratropium (ATROVENT) 0.06 % nasal spray 2 sprays in each nostril     levonorgestrel (MIRENA) 20 MCG/24HR IUD 1 each by Intrauterine route once.     linaclotide (LINZESS) 145 MCG CAPS capsule Take 145 mcg by mouth. As needed.     losartan (COZAAR) 100 MG tablet Take 1 tablet by mouth daily.     montelukast (SINGULAIR) 10 MG tablet Take 1 tablet by mouth daily.     Multiple Vitamins-Calcium (ONE-A-DAY WOMENS PO) Take by mouth daily.     naltrexone (DEPADE) 50 MG tablet Take 25 mg by mouth daily.     Semaglutide-Weight Management (WEGOVY) 2.4 MG/0.75ML SOAJ Inject 2.4 mg into the skin once a week. 9 mL 0   tamoxifen (NOLVADEX) 10 MG tablet Take 0.5 tablets (5 mg total) by mouth daily. 45 tablet 3    traZODone (DESYREL) 50 MG tablet Take 50 mg by mouth at bedtime.     triamcinolone lotion (KENALOG) 0.1 %      No current facility-administered medications for this visit.     PHYSICAL EXAMINATION: ECOG PERFORMANCE STATUS: 0 - Asymptomatic  Vitals:   06/12/23 1512  BP: 137/82  Pulse: 73  Resp: 16  Temp: 98.5 F (36.9 C)  SpO2: 98%     Filed Weights   06/12/23 1512  Weight: 205 lb 3.2 oz (93.1 kg)      GENERAL:alert, no distress and comfortable Breast: Bilateral breasts inspected and palpated.  Heterogeneously dense breasts with scattered cysts.  No overtly palpable masses or regional adenopathy. Small masses may not be felt given density. No LE swelling  LABORATORY DATA:  I have reviewed the data as listed Lab Results  Component Value Date   WBC 14.0 (H) 12/25/2021   HGB 12.7 12/25/2021   HCT 38.8 12/25/2021   MCV 88.2 12/25/2021   PLT 280 12/25/2021     Chemistry      Component Value Date/Time   NA 135 08/07/2022 1412   K 3.5 08/07/2022 1412   CL 104 08/07/2022 1412   CO2 24 08/07/2022 1412   BUN 8 08/07/2022 1412   CREATININE 0.83 08/07/2022 1412      Component Value Date/Time   CALCIUM 8.7 (L) 08/07/2022 1412   ALKPHOS 79 12/25/2021  2224   AST 97 (H) 12/25/2021 2224   ALT 64 (H) 12/25/2021 2224   BILITOT 0.8 12/25/2021 2224       RADIOGRAPHIC STUDIES: I have personally reviewed the radiological images as listed and agreed with the findings in the report. MR BREAST BILATERAL W WO CONTRAST INC CAD Result Date: 05/23/2023 CLINICAL DATA:  43 year old female presents for high risk screening breast MRI. History of RIGHT breast ADH with surgical excision in 2024. EXAM: BILATERAL BREAST MRI WITH AND WITHOUT CONTRAST TECHNIQUE: Multiplanar, multisequence MR images of both breasts were obtained prior to and following the intravenous administration of 10 ml of Gadavist Three-dimensional MR images were rendered by post-processing of the original MR data on an  independent workstation. The three-dimensional MR images were interpreted, and findings are reported in the following complete MRI report for this study. Three dimensional images were evaluated at the independent interpreting workstation using the DynaCAD thin client. COMPARISON:  06/01/2022 MR. Prior mammograms. FINDINGS: Breast composition: c. Heterogeneous fibroglandular tissue. Background parenchymal enhancement: Mild Right breast: No suspicious mass or some enhancement. Surgical changes within the UPPER-OUTER RIGHT breast are identified. Small scattered cysts are present. Left breast: No suspicious mass or worrisome enhancement. Small scattered cysts are present. Biopsy clip artifact within the posterior UPPER LEFT breast identified. Lymph nodes: No abnormal appearing lymph nodes. Ancillary findings:  None. IMPRESSION: 1. No MR evidence of breast malignancy. 2. Surgical changes within the UPPER-OUTER RIGHT breast. 3. Small scattered bilateral breast cysts. RECOMMENDATION: Bilateral screening mammogram in 7 months to resume annual mammogram schedule. Bilateral screening breast MRI in 1 year in this high risk patient. BI-RADS CATEGORY  2: Benign. Electronically Signed   By: Harmon Pier M.D.   On: 05/23/2023 14:56    All questions were answered. The patient knows to call the clinic with any problems, questions or concerns. I spent 30  minutes in the care of this patient including H and P, review of records, counseling and coordination of care.     Rachel Moulds, MD 06/12/2023 4:57 PM

## 2023-06-12 ENCOUNTER — Inpatient Hospital Stay: Attending: Hematology and Oncology | Admitting: Hematology and Oncology

## 2023-06-12 VITALS — BP 137/82 | HR 73 | Temp 98.5°F | Resp 16 | Wt 205.2 lb

## 2023-06-12 DIAGNOSIS — N6001 Solitary cyst of right breast: Secondary | ICD-10-CM | POA: Diagnosis present

## 2023-06-12 DIAGNOSIS — Z79810 Long term (current) use of selective estrogen receptor modulators (SERMs): Secondary | ICD-10-CM | POA: Diagnosis not present

## 2023-06-12 DIAGNOSIS — Z803 Family history of malignant neoplasm of breast: Secondary | ICD-10-CM | POA: Insufficient documentation

## 2023-06-12 DIAGNOSIS — Z9189 Other specified personal risk factors, not elsewhere classified: Secondary | ICD-10-CM

## 2023-06-12 NOTE — Telephone Encounter (Signed)
 No entry

## 2023-06-13 ENCOUNTER — Inpatient Hospital Stay: Admitting: Hematology and Oncology

## 2023-07-21 ENCOUNTER — Encounter: Payer: Self-pay | Admitting: Hematology and Oncology

## 2023-08-26 ENCOUNTER — Other Ambulatory Visit (HOSPITAL_COMMUNITY): Payer: Self-pay

## 2023-08-26 MED ORDER — WEGOVY 2.4 MG/0.75ML ~~LOC~~ SOAJ
2.4000 mg | SUBCUTANEOUS | 0 refills | Status: AC
  Filled 2023-08-26: qty 9, 84d supply, fill #0

## 2023-08-27 ENCOUNTER — Other Ambulatory Visit (HOSPITAL_COMMUNITY): Payer: Self-pay

## 2023-09-03 ENCOUNTER — Ambulatory Visit: Admitting: Adult Health

## 2023-09-05 ENCOUNTER — Other Ambulatory Visit (HOSPITAL_COMMUNITY)
Admission: RE | Admit: 2023-09-05 | Discharge: 2023-09-05 | Disposition: A | Discharge status: Home / Self Care | Source: Ambulatory Visit | Attending: Adult Health | Admitting: Adult Health

## 2023-09-05 ENCOUNTER — Encounter: Payer: Self-pay | Admitting: Adult Health

## 2023-09-05 ENCOUNTER — Ambulatory Visit: Admitting: Adult Health

## 2023-09-05 VITALS — BP 147/87 | HR 75 | Ht 63.0 in | Wt 196.0 lb

## 2023-09-05 DIAGNOSIS — R102 Pelvic and perineal pain: Secondary | ICD-10-CM

## 2023-09-05 DIAGNOSIS — N939 Abnormal uterine and vaginal bleeding, unspecified: Secondary | ICD-10-CM

## 2023-09-05 DIAGNOSIS — Z975 Presence of (intrauterine) contraceptive device: Secondary | ICD-10-CM | POA: Diagnosis not present

## 2023-09-05 DIAGNOSIS — N941 Unspecified dyspareunia: Secondary | ICD-10-CM

## 2023-09-05 DIAGNOSIS — I10 Essential (primary) hypertension: Secondary | ICD-10-CM

## 2023-09-05 DIAGNOSIS — Z3202 Encounter for pregnancy test, result negative: Secondary | ICD-10-CM | POA: Diagnosis not present

## 2023-09-05 DIAGNOSIS — Z113 Encounter for screening for infections with a predominantly sexual mode of transmission: Secondary | ICD-10-CM | POA: Insufficient documentation

## 2023-09-05 LAB — POCT URINE PREGNANCY: Preg Test, Ur: NEGATIVE

## 2023-09-05 NOTE — Progress Notes (Signed)
  Subjective:     Patient ID: Kathy White, female   DOB: [birth year], 43 y.o.   MRN: [id number]  HPI Kathy White is a 43 year old white female,single, G1P0101, in complaining of vaginal bleeding for 2 1/2 weeks stopped yesterday, pain with sex, cramping and bleeding after sex. She has IUD.  She was on tamoxifen  for 1-2 months and stopped about 1 1/2 month ago due to side effects.     Component Value Date/Time   DIAGPAP  11/27/2022 1536    - Negative for intraepithelial lesion or malignancy (NILM)   DIAGPAP  07/21/2019 1102    - Negative for intraepithelial lesion or malignancy (NILM)   HPVHIGH Negative 11/27/2022 1536   HPVHIGH Negative 07/21/2019 1102   ADEQPAP  11/27/2022 1536    Satisfactory for evaluation; transformation zone component PRESENT.   ADEQPAP  07/21/2019 1102    Satisfactory for evaluation; transformation zone component PRESENT.    PCP is Dr Burney   Review of Systems +vaginal bleeding for 2 1/2 weeks stopped yesterday,  +pain with sex,  +cramping and bleeding after sex. Has lost about 65 lbs on Wegovy   Reviewed past medical,surgical, social and family history. Reviewed medications and allergies.     Objective:   Physical Exam BP (!) 147/87 (BP Location: Left Arm, Patient Position: Sitting, Cuff Size: Normal)   Pulse 75   Ht 5' 3 (1.6 m)   Wt 196 lb (88.9 kg)   BMI 34.72 kg/m  UPT is negative  Skin warm and dry.Pelvic: external genitalia is normal in appearance no lesions, vagina: tan discharge without odor,urethra has no lesions or masses noted, cervix:smooth and bulbous,+IUD strings at os, uterus: normal size, shape and contour, non tender, no masses felt, adnexa: no masses or tenderness noted. Bladder is non tender and no masses felt.   Fall risk is low  [facility] - 09/05/23 1518       Pregnancy Intention Screening   Does the patient want to become pregnant in the next year? No    Does the patient's partner want to become pregnant in the next year? No     Would the patient like to discuss contraceptive options today? No      Contraception Wrap Up   Current Method IUD or IUS    End Method IUD or IUS    Contraception Counseling Provided Yes         Examination chaperoned by Clarita Salt LPN  Assessment:     1. Pregnancy examination or test, negative result - POCT urine pregnancy  2. IUD (intrauterine device) in place Mirena  placed 12/28/15 - US  PELVIC COMPLETE WITH TRANSVAGINAL; Future  3. Abnormal uterine bleeding (AUB) (Primary) +vaginal bleeding for 2 1/2 weeks stopped yesterday,  CV swab sent US  scheduled at [facility] 09/12/23 at 3:30 pm to assess uterus and ovaries  - Cervicovaginal ancillary only( [place]) - US  PELVIC COMPLETE WITH TRANSVAGINAL; Future  4. Pelvic cramping +cramping - Cervicovaginal ancillary only( [place]) - US  PELVIC COMPLETE WITH TRANSVAGINAL; Future  5. Dyspareunia, female  6. Screening examination for STD (sexually transmitted disease) CV swab sent for GC/CHL,trich,BV and yeast  - Cervicovaginal ancillary only( [place])  7. Primary hypertension Take BP meds and follow up with PCP      Plan:     Follow up TBD

## 2023-09-10 ENCOUNTER — Ambulatory Visit: Payer: Self-pay | Admitting: Adult Health

## 2023-09-10 LAB — CERVICOVAGINAL ANCILLARY ONLY
Bacterial Vaginitis (gardnerella): NEGATIVE
Candida Glabrata: NEGATIVE
Candida Vaginitis: NEGATIVE
Chlamydia: NEGATIVE
Comment: NEGATIVE
Comment: NEGATIVE
Comment: NEGATIVE
Comment: NEGATIVE
Comment: NEGATIVE
Comment: NORMAL
Neisseria Gonorrhea: NEGATIVE
Trichomonas: NEGATIVE

## 2023-09-12 ENCOUNTER — Ambulatory Visit (HOSPITAL_COMMUNITY)
Admission: RE | Admit: 2023-09-12 | Discharge: 2023-09-12 | Disposition: A | Discharge status: Home / Self Care | Source: Ambulatory Visit | Attending: Adult Health | Admitting: Adult Health

## 2023-09-12 DIAGNOSIS — Z975 Presence of (intrauterine) contraceptive device: Secondary | ICD-10-CM | POA: Diagnosis present

## 2023-09-12 DIAGNOSIS — N939 Abnormal uterine and vaginal bleeding, unspecified: Secondary | ICD-10-CM | POA: Diagnosis present

## 2023-09-12 DIAGNOSIS — R102 Pelvic and perineal pain: Secondary | ICD-10-CM | POA: Diagnosis present

## 2023-11-06 ENCOUNTER — Ambulatory Visit

## 2023-11-21 ENCOUNTER — Encounter: Payer: Self-pay | Admitting: Obstetrics & Gynecology

## 2023-11-21 ENCOUNTER — Ambulatory Visit: Admitting: Obstetrics & Gynecology

## 2023-11-21 VITALS — BP 113/78 | HR 76 | Ht 63.0 in | Wt 192.8 lb

## 2023-11-21 DIAGNOSIS — Z3009 Encounter for other general counseling and advice on contraception: Secondary | ICD-10-CM

## 2023-11-21 DIAGNOSIS — N939 Abnormal uterine and vaginal bleeding, unspecified: Secondary | ICD-10-CM | POA: Diagnosis not present

## 2023-11-21 DIAGNOSIS — Z975 Presence of (intrauterine) contraceptive device: Secondary | ICD-10-CM | POA: Diagnosis not present

## 2023-11-21 MED ORDER — MISOPROSTOL 200 MCG PO TABS
ORAL_TABLET | ORAL | 0 refills | Status: AC
Start: 2023-11-21 — End: ?

## 2023-11-21 NOTE — Addendum Note (Signed)
 Addended by: MARILYNN DELON HERO on: 11/21/2023 01:19 PM   Modules accepted: Orders

## 2023-11-21 NOTE — Progress Notes (Addendum)
 GYN VISIT Patient name: Kathy White MRN [id number]  Date of birth: Dec 30, 1980 Chief Complaint:   Surgery Consult  History of Present Illness:   Kathy White is a 43 y.o. G48P0101 female being seen today for the following concerns:  Wishes to review options for contraceptive management.  She does not desire a future pregnancy.  -Notes long-standing h/o AUB- menses were every 3 mos, but severe and painful.  Currently has IUD, which has been working well (no period), but now has some irregular spotting.  Initially was light and has become heavier usually requiring a little more than a panty liner every few hours.  Additionally she notes occasional sharp midline pain  Mirena  2017  Imaging completed and reviewed US  09/2023: Significant retroverted uterus, normal size measuring 13 x 6 x 6 cm.  IUD difficult to visualize due to 4.2 x 2.7 x 2.5 cm intramural posterior fibroid Patient's last menstrual period was 10/22/2023 (approximate).    Review of Systems:   Pertinent items are noted in HPI Denies fever/chills, dizziness, headaches, visual disturbances, fatigue, shortness of breath, chest pain, abdominal pain, vomiting Pertinent History Reviewed:   Past Surgical History:  Procedure Laterality Date   BREAST BIOPSY  08/07/2022   MM RT RADIOACTIVE SEED LOC MAMMO GUIDE 08/07/2022 [facility]MO[facility]   BREAST LUMPECTOMY WITH RADIOACTIVE SEED LOCALIZATION Right 08/08/2022   Procedure: RIGHT BREAST LUMPECTOMY WITH RADIOACTIVE SEED LOCALIZATION;  Surgeon: Curvin Deward MOULD, MD;  Location: [facility];  Service: General;  Laterality: Right;   CARPAL TUNNEL RELEASE Right 04/28/2021   CERVICAL BIOPSY  W/ LOOP ELECTRODE EXCISION     CESAREAN SECTION  11/14/2004   EXAM UNDER ANESTHESIA WITH MANIPULATION OF KNEE Left 03/22/2015   Procedure: LEFT KNEE EXAM UNDER ANESTHESIA  ;  Surgeon: Lamar Collet, MD;  Location: [facility];  Service: Orthopedics;  Laterality: Left;    HAND WOUND EXPLORATION AND TENDON REPAIR'S Right 10/12/2014   right ring and index fingers   KNEE ARTHROSCOPY W/ PARTIAL MEDIAL MENISCECTOMY Left 09/23/2021   KNEE ARTHROSCOPY WITH ANTERIOR CRUCIATE LIGAMENT (ACL) REPAIR WITH HAMSTRING GRAFT Left 03/22/2015   Procedure: LEFT KNEE ARTHROSCOPY WITH ANTERIOR CRUCIATE LIGAMENT (ACL) AUTOGRAFT RECONSTRUCTION;  Surgeon: Lamar Collet, MD;  Location: [facility];  Service: Orthopedics;  Laterality: Left;  ANESTHESIA: GENERAL/ABDUCTOR CANAL BLOCK   KNEE SURGERY  09/2021   LAPAROSCOPIC CHOLECYSTECTOMY  12/20/2004   LAPAROSCOPIC GASTRIC BAND REMOVAL WITH LAPAROSCOPIC GASTRIC SLEEVE RESECTION  09/09/2017   MENISECTOMY Left 03/22/2015   Procedure: PARTIAL LEFT KNEE  LATERAL MENISECTOMY, CHONDROPLASTY;  Surgeon: Lamar Collet, MD;  Location: [facility];  Service: Orthopedics;  Laterality: Left;   NERVE, TENDON AND ARTERY REPAIR Right 10/12/2014   Procedure: RIGHT RING FINGER AND RIGHT SMALL FINGER WOUND EXPLORATION WITH TENDON REPAIRS;  Surgeon: Prentice Pagan, MD;  Location: [facility];  Service: Orthopedics;  Laterality: Right;   SYNOVECTOMY Left 08/23/2022   Procedure: SYNOVECTOMY;  Surgeon: Cristy Bonner DASEN, MD;  Location: [facility];  Service: Orthopedics;  Laterality: Left;   WISDOM TOOTH EXTRACTION      Past Medical History:  Diagnosis Date   Acute meniscal tear of left knee    Allergy    Any narcotics pain medications, adhesive tape   Benign essential HTN 01/13/2014   BRCA negative 09/14/2015   Family history of BRCA1 gene positive    Family history of breast cancer    Family history of uterine cancer    FH:  breast cancer in first degree relative 02/26/2013   Fibroid 09/15/2015   GERD (gastroesophageal reflux disease) 03/12/2018   More frequent after gastric sleeve surgery   Hematuria 04/21/2013   History of abnormal cervical Pap smear    History of HPV infection    Hypertension    IBS (irritable  bowel syndrome)    Left ACL tear    Migraine    OA (osteoarthritis) of knee    Obesity    Pain of joint of left ankle and foot 10/08/2019   PCO (polycystic ovaries)    Pelvic pain 09/16/2018   S/P ACL reconstruction 03/22/2015   Stiffness of left knee 09/25/2021   Vaginal Pap smear, abnormal    Vitamin D deficiency    Reviewed problem list, medications and allergies. Physical Assessment:   Vitals:   11/21/23 0934  BP: 113/78  Pulse: 76  Weight: 192 lb 12.8 oz (87.5 kg)  Height: 5' 3 (1.6 m)  Body mass index is 34.15 kg/m.       Physical Examination:   General appearance: alert, well appearing, and in no distress  Psych: mood appropriate, normal affect  Skin: warm & dry   Cardiovascular: normal heart rate noted  Respiratory: normal respiratory effort, no distress  Extremities: no edema   Chaperone: N/A    Assessment & Plan:  1) Contraceptive management, h/o HMB and dysmenorrhea - Reviewed bilateral salpingectomy.  Discussed risk benefits including but not limited to risk of bleeding, infection, injury to surrounding organs.  Also discussed that sterilization would not regulate her menses.  Anticipate that with removal of IUD menses would return to significant bleeding as well as dysmenorrhea. - Discussed based on her history and well-managed menses with IUD, would consider replacement of device - Patient shared that prior insertion with Dr. Edsel was challenging - Questions and concerns were addressed and plan to proceed with IUD replacement  []  Due to ultrasound which confirms retroversion of uterus as well as patient's experience, we will plan for premedication with Cytotec  and ibuprofen .  Patient declined Valium/Percocet []  Also plan for cervical block with placement   Meds ordered this encounter  Medications   misoprostol  (CYTOTEC ) 200 MCG tablet    Sig: Take orally or vaginally the night before appointment    Dispense:  2 tablet    Refill:  0       Return for IUD replacement next available with Dr. Sharmarke Cicio.   Kynzleigh Bandel, DO Attending Obstetrician & Gynecologist, [facility] for [employer], [facility]

## 2023-11-28 ENCOUNTER — Encounter: Payer: Self-pay | Admitting: Obstetrics & Gynecology

## 2023-11-28 ENCOUNTER — Ambulatory Visit: Admitting: Obstetrics & Gynecology

## 2023-11-28 VITALS — BP 118/85 | HR 81 | Ht 63.0 in | Wt 195.0 lb

## 2023-11-28 DIAGNOSIS — N939 Abnormal uterine and vaginal bleeding, unspecified: Secondary | ICD-10-CM

## 2023-11-28 DIAGNOSIS — Z30433 Encounter for removal and reinsertion of intrauterine contraceptive device: Secondary | ICD-10-CM | POA: Diagnosis not present

## 2023-11-28 MED ORDER — LEVONORGESTREL 20 MCG/DAY IU IUD
1.0000 | INTRAUTERINE_SYSTEM | Freq: Once | INTRAUTERINE | Status: AC
Start: 2023-11-28 — End: 2023-11-28
  Administered 2023-11-28: 1 via INTRAUTERINE

## 2023-11-28 NOTE — Progress Notes (Signed)
 IUD REMOVAL & RE-INSERTION Patient name: Kathy White MRN [id number]  Date of birth: Apr 15, 1980 Subjective Findings:   Kathy White is a 43 y.o. G76P0101 female being seen today for removal of a Mirena   IUD and insertion of a Mirena   IUD. Her IUD was placed 2017.  Patient's last menstrual period was 11/28/2023 (approximate).  In review, patient was seen on 11/21/2023 due to AUB.  She had noted that for the past several months she started to have irregular spotting as well as dysmenorrhea.  Previously the Mirena  had been working well for her.  Options were reviewed and plan was to proceed with Mirena  replacement  Pelvic ultrasound completed July 2025 13 cm retroverted uterus with 4 cm intramural posterior fibroid  The risks and benefits of the method and placement have been thouroughly reviewed with the patient and all questions were answered.  Specifically the patient is aware of failure rate of 03/998, expulsion of the IUD and of possible perforation.  The patient is aware of irregular bleeding due to the method and understands the incidence of irregular bleeding diminishes with time.  Signed copy of informed consent in chart.      11/27/2022    3:34 PM 10/23/2021    1:54 PM 07/21/2020    3:43 PM 07/21/2019   11:35 AM 02/25/2018    9:14 AM  Depression screen PHQ 2/9  Decreased Interest 0 0 0 0 0  Down, Depressed, Hopeless 0 0 0 0 0  PHQ - 2 Score 0 0 0 0 0  Altered sleeping 1 2 1     Tired, decreased energy 1 1 1     Change in appetite 1 1 2     Feeling bad or failure about yourself  0 0 0    Trouble concentrating 0 0 0    Moving slowly or fidgety/restless 0 0 0    Suicidal thoughts 0 0 0    PHQ-9 Score 3 4 4           11/27/2022    3:35 PM 10/23/2021    1:55 PM 07/21/2020    3:44 PM  GAD 7 : Generalized Anxiety Score  Nervous, Anxious, on Edge 0 1 0  Control/stop worrying 1 1 0  Worry too much - different things 1 1 1   Trouble relaxing 1 1 1   Restless 0 1 0  Easily annoyed  or irritable 1 1 0  Afraid - awful might happen 1 1 0  Total GAD 7 Score 5 7 2      Pertinent History Reviewed:   Reviewed past medical,surgical, social, obstetrical and family history.  Reviewed problem list, medications and allergies. Objective Findings & Procedure:    Vitals:   11/28/23 1120 11/28/23 1128  BP: (!) 136/90 118/85  Pulse: 81   Weight: 195 lb (88.5 kg)   Height: 5' 3 (1.6 m)   Body mass index is 34.54 kg/m.  No results found for this or any previous visit (from the past 24 hours).   Time out was performed.  A sterile speculum was placed in the vagina.  The cervix was visualized, prepped using Betadine . The strings visible. They were grasped and the Mirena  IUD was easily removed.  Cervical block was completed by injecting 1% lidocaine  into the anterior lip of the cervix.  The cervix was then grasped with a single-tooth tenaculum. The uterus was found to be retroflexed and it sounded to 10 cm.  Both's finder and small dilators were used due to  the positioning of the uterus.  Mirena   IUD placed per manufacturer's recommendations without complications. The strings were trimmed to approximately 3 cm.  The patient tolerated the procedure well.   Informal transvaginal sonogram was performed and the proper placement of the IUD was verified.   Chaperone: Barrister's clerk & Plan:   1) Mirena  IUD removal & Mirena  insertion The patient was given post procedure instructions, including signs and symptoms of infection and to check for the strings after each menses or each month, and refraining from intercourse or anything in the vagina for 3 days. She was given a care card with date IUD placed, and date IUD to be removed. She is scheduled for a f/u appointment in 6-8 weeks.  No orders of the defined types were placed in this encounter.   Follow-up: Return in about 6 weeks (around 01/09/2024) for 6-8wk IUD check up.  Matie Dimaano, DO Attending Obstetrician &  Gynecologist, [facility] for [employer], [facility]

## 2023-11-28 NOTE — Addendum Note (Signed)
 Addended by: ILEAN RUTHERFORD HERO on: 11/28/2023 12:26 PM   Modules accepted: Orders

## 2023-11-29 ENCOUNTER — Other Ambulatory Visit (HOSPITAL_COMMUNITY): Payer: Self-pay

## 2023-11-29 MED ORDER — WEGOVY 2.4 MG/0.75ML ~~LOC~~ SOAJ
2.4000 mg | SUBCUTANEOUS | 0 refills | Status: AC
  Filled 2023-11-29: qty 9, 84d supply, fill #0

## 2023-12-16 ENCOUNTER — Telehealth: Payer: Self-pay

## 2023-12-16 NOTE — Telephone Encounter (Signed)
 Spoke with patient and confirmed appointment on 10/7

## 2023-12-17 ENCOUNTER — Inpatient Hospital Stay: Attending: Hematology and Oncology | Admitting: Hematology and Oncology

## 2023-12-17 VITALS — BP 131/60 | HR 58 | Temp 98.3°F | Resp 16 | Wt 193.8 lb

## 2023-12-17 DIAGNOSIS — Z79899 Other long term (current) drug therapy: Secondary | ICD-10-CM | POA: Diagnosis not present

## 2023-12-17 DIAGNOSIS — I10 Essential (primary) hypertension: Secondary | ICD-10-CM | POA: Diagnosis not present

## 2023-12-17 DIAGNOSIS — Z79810 Long term (current) use of selective estrogen receptor modulators (SERMs): Secondary | ICD-10-CM | POA: Diagnosis not present

## 2023-12-17 DIAGNOSIS — N6091 Unspecified benign mammary dysplasia of right breast: Secondary | ICD-10-CM | POA: Insufficient documentation

## 2023-12-17 DIAGNOSIS — Z9189 Other specified personal risk factors, not elsewhere classified: Secondary | ICD-10-CM

## 2023-12-17 DIAGNOSIS — Z803 Family history of malignant neoplasm of breast: Secondary | ICD-10-CM | POA: Diagnosis not present

## 2023-12-17 NOTE — Progress Notes (Signed)
 Kathy White CONSULT NOTE  Patient Care Team: Burney Darice CROME, MD as PCP - General (Family Medicine) Rollin Dover, MD as Consulting Physician (Gastroenterology) Margaret Eduard SAUNDERS, MD as Consulting Physician (Neurology) Gerome Charleston, MD (Inactive) as Consulting Physician (Orthopedic Surgery) Signa Delon LABOR, NP as Nurse Practitioner (Obstetrics and Gynecology)  CHIEF COMPLAINTS/PURPOSE OF CONSULTATION:  ADH of right breast.  ASSESSMENT & PLAN:   This is a very pleasant 43 yr old female patient with strong FH in mom, sister, maternal grandmother with [facility] referred to high risk breast clinic because of ADH Prognosis:Using the TC model calculated, life time risk calculated is around 36%. Assessment & Plan ADH of the breast. Previously on tamoxifen , discontinued due to nausea.  - she doesn't want to continue tamoxifen . She says she feels so much better not taking the medication. She understands the benefits of antiestrogen therapy. - Remove tamoxifen  from medication list. - Perform mammogram this Friday. - Schedule MRI in the spring. - Instruct to perform self-breast exam monthly. - Advise to monitor for sudden changes such as nipple changes or new lumps.  Obesity On Wegovy , lost 45 pounds, current weight 193 pounds.    HISTORY OF PRESENTING ILLNESS:  Kathy White 43 y.o. female is here because of ADH of right breast.   At baseline she has some mild arthritis, multiple surgical repairs related to arthritis, hypertension, obesity.  She is currently taking bupropion for weight loss, previously was tried on phentermine.  She has a very strong family history of breast cancer and multiple family members with BRCA1 although she tested negative back when she was 20.  Her mother, her sister as well as her maternal grandmother had breast cancer.   History of Present Illness    Discussed the use of AI scribe software for clinical note transcription with the patient,  who gave verbal consent to proceed.  History of Present Illness Kathy White is a 43 year old female with breast cancer who presents for follow-up regarding medication side effects.  She has been experiencing persistent nausea associated with her medication regimen. Previously on tamoxifen  for approximately two years, she discontinued it about a month ago.  She is dissatisfied with tamoxifen  as it causes her to feel unwell.  She is also on Wegovy , which she believes contributes to her nausea. The nausea typically occurs after her weekly injection and subsides thereafter. She is currently on a 2.4 mg dose of Wegovy  but will discontinue it in three months due to insurance coverage issues. She has lost approximately 45 pounds, with her current weight being 193 pounds, down from 234 pounds.  She reports tenderness under her right arm. No significant changes in her breasts aside from size reduction due to weight loss. She is up to date with her mammograms and MRIs, with a mammogram scheduled for this Friday and an MRI planned for the spring.  MEDICAL HISTORY:  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   Hematuria 04/21/2013   History of  abnormal cervical Pap smear    History of HPV infection    Hypertension    IBS (irritable bowel syndrome)    Left ACL tear    Migraine    OA (osteoarthritis) of knee    Obesity    Pain of joint of left ankle and foot 10/08/2019   PCO (polycystic ovaries)    Pelvic pain 09/16/2018   S/P ACL reconstruction 03/22/2015    Stiffness of left knee 09/25/2021   Vaginal Pap smear, abnormal    Vitamin D deficiency     SURGICAL HISTORY: Past Surgical History:  Procedure Laterality Date   BREAST BIOPSY  08/07/2022   MM RT RADIOACTIVE SEED LOC MAMMO GUIDE 08/07/2022 [facility] MAMMOGRAPHY   BREAST LUMPECTOMY WITH RADIOACTIVE SEED LOCALIZATION Right 08/08/2022   Procedure: RIGHT BREAST LUMPECTOMY WITH RADIOACTIVE SEED LOCALIZATION;  Surgeon: Curvin Deward MOULD, MD;  Location: [facility];  Service: General;  Laterality: Right;   CARPAL TUNNEL RELEASE Right 04/28/2021   CERVICAL BIOPSY  W/ LOOP ELECTRODE EXCISION     CESAREAN SECTION  11/14/2004   EXAM UNDER ANESTHESIA WITH MANIPULATION OF KNEE Left 03/22/2015   Procedure: LEFT KNEE EXAM UNDER ANESTHESIA  ;  Surgeon: Lamar Collet, MD;  Location: [facility] [place];  Service: Orthopedics;  Laterality: Left;   HAND WOUND EXPLORATION AND TENDON REPAIR'S Right 10/12/2014   right ring and index fingers   KNEE ARTHROSCOPY W/ PARTIAL MEDIAL MENISCECTOMY Left 09/23/2021   KNEE ARTHROSCOPY WITH ANTERIOR CRUCIATE LIGAMENT (ACL) REPAIR WITH HAMSTRING GRAFT Left 03/22/2015   Procedure: LEFT KNEE ARTHROSCOPY WITH ANTERIOR CRUCIATE LIGAMENT (ACL) AUTOGRAFT RECONSTRUCTION;  Surgeon: Lamar Collet, MD;  Location: [facility] [place];  Service: Orthopedics;  Laterality: Left;  ANESTHESIA: GENERAL/ABDUCTOR CANAL BLOCK   KNEE SURGERY  09/2021   LAPAROSCOPIC CHOLECYSTECTOMY  12/20/2004   LAPAROSCOPIC GASTRIC BAND REMOVAL WITH LAPAROSCOPIC GASTRIC SLEEVE RESECTION  09/09/2017   MENISECTOMY Left 03/22/2015   Procedure: PARTIAL LEFT KNEE  LATERAL MENISECTOMY, CHONDROPLASTY;  Surgeon: Lamar Collet, MD;  Location: [facility];  Service: Orthopedics;  Laterality: Left;   NERVE, TENDON AND ARTERY REPAIR Right 10/12/2014   Procedure: RIGHT RING FINGER AND RIGHT SMALL FINGER WOUND EXPLORATION WITH TENDON REPAIRS;  Surgeon: Prentice Pagan, MD;  Location: [facility];   Service: Orthopedics;  Laterality: Right;   SYNOVECTOMY Left 08/23/2022   Procedure: SYNOVECTOMY;  Surgeon: Cristy Bonner DASEN, MD;  Location: [facility];  Service: Orthopedics;  Laterality: Left;   WISDOM TOOTH EXTRACTION      SOCIAL HISTORY: Social History   Socioeconomic History   Marital status: Single    Spouse name: Not on file   Number of children: 1   Years of education: Not on file   Highest education level: Bachelor's degree (e.g., BA, AB, BS)  Occupational History   Not on file  Tobacco Use   Smoking status: Never   Smokeless tobacco: Never  Vaping Use   Vaping status: Never Used  Substance and Sexual Activity   Alcohol use: No   Drug use: No   Sexual activity: Yes    Birth control/protection: I.U.D.  Other Topics Concern   Not on file  Social History Narrative   Not on file   Social Drivers of Health   Financial Resource Strain: Low Risk  (08/11/2023)   Received [facility]   Overall Financial Resource Strain (CARDIA)    Difficulty of Paying Living Expenses: Not hard at all  Food Insecurity: No Food Insecurity (  08/11/2023)   Received from [facility]   Hunger Vital Sign    Within the past 12 months, you worried that your food would run out before you got the money to buy more.: Never true    Within the past 12 months, the food you bought just didn't last and you didn't have money to get more.: Never true  Transportation Needs: No Transportation Needs (08/11/2023)   Received from [facility]   PRAPARE - Transportation    Lack of Transportation (Medical): No    Lack of Transportation (Non-Medical): No  Physical Activity: Insufficiently Active (08/11/2023)   Received from [facility]   Exercise Vital Sign    On average, how many days per week do you engage in moderate to strenuous exercise (like a brisk walk)?: 2 days    On average, how many minutes do you engage in exercise at this level?: 40 min  Stress: Stress Concern Present (08/11/2023)    Received from [facility] - Occupational Stress Questionnaire    Feeling of Stress : To some extent  Social Connections: Moderately Integrated (08/11/2023)   Received from [facility]   Social Network    How would you rate your social network (family, work, friends)?: Adequate participation with social networks  Intimate Partner Violence: Not At Risk (08/11/2023)   Received from [facility]   HITS    Over the last 12 months how often did your partner physically hurt you?: Never    Over the last 12 months how often did your partner insult you or talk down to you?: Never    Over the last 12 months how often did your partner threaten you with physical harm?: Never    Over the last 12 months how often did your partner scream or curse at you?: Never    FAMILY HISTORY: Family History  Problem Relation Age of Onset   Hypertension Mother    Breast cancer Mother 58   BRCA 1/2 Mother        BRCA1+   Diabetes Father    Hypertension Father    Other Father        open heart surgery   Anxiety disorder Father    Depression Father    Heart disease Father    Stroke Father    Hypertension Sister    Diabetes Sister    Breast cancer Sister 30       bilateral   Stroke Sister    Anxiety disorder Sister    Depression Sister    Heart disease Sister    BRCA 1/2 Sister        BRCA1   Diabetes Sister    Hypertension Sister    Neuropathy Sister    Obesity Sister    Anxiety disorder Sister    Depression Sister    Hypertension Sister    Thyroid disease Sister    Endometrial cancer Sister 36   Obesity Sister    Thyroid disease Brother    BRCA 1/2 Maternal Grandmother        BRCA1 pos   Breast cancer Maternal Grandmother        bilateral, d. 45   Bone cancer Maternal Grandfather    Diabetes Paternal Grandfather    Anxiety disorder Son    Depression Son    Obesity Son    Cancer Maternal Aunt    Breast cancer Maternal Aunt        bilateral;  d. 30  BRCA 1/2 Cousin    Breast cancer Cousin 26       d. 43    ALLERGIES:  is allergic to bee venom, doxycycline, hydrocodone, oxycodone -acetaminophen , amlodipine, attends briefs small, percocet [oxycodone -acetaminophen ], azo cranberry urinary tract [cranberry-vitamin c], and tape.  MEDICATIONS:  Current Outpatient Medications  Medication Sig Dispense Refill   budesonide-formoterol (SYMBICORT) 160-4.5 MCG/ACT inhaler As needed.     Calcium Carb-Cholecalciferol (CALTRATE 600+D3 PO) Take 1 tablet by mouth daily.     cetirizine (ZYRTEC) 10 MG tablet Take by mouth.     cholecalciferol (VITAMIN D) 1000 units tablet Take 4,000 Units by mouth daily.     Cranberry-Vitamin C-Probiotic (AZO CRANBERRY PO) Take 2 capsules by mouth daily in the afternoon. (Patient not taking: Reported on 11/28/2023)     cyclobenzaprine (FLEXERIL) 10 MG tablet As needed.     famotidine (PEPCID) 40 MG tablet Take 40 mg by mouth daily.     fluticasone (FLONASE) 50 MCG/ACT nasal spray Place 2 sprays into the nose as needed for allergies.      gabapentin  (NEURONTIN ) 400 MG capsule Take 400 mg by mouth at bedtime.     hydrochlorothiazide  (HYDRODIURIL ) 25 MG tablet Take 1 tablet by mouth daily.     hydrOXYzine (ATARAX/VISTARIL) 10 MG tablet hydroxyzine HCl 10 mg tablet     ipratropium (ATROVENT) 0.06 % nasal spray 2 sprays in each nostril     levonorgestrel  (MIRENA ) 20 MCG/24HR IUD 1 each by Intrauterine route once.     levonorgestrel  (MIRENA ) 20 MCG/DAY IUD 1 each by Intrauterine route once.     linaclotide (LINZESS) 145 MCG CAPS capsule Take 145 mcg by mouth. As needed.     losartan (COZAAR) 100 MG tablet Take 1 tablet by mouth daily.     misoprostol  (CYTOTEC ) 200 MCG tablet Take orally or vaginally the night before appointment 2 tablet 0   montelukast (SINGULAIR) 10 MG tablet Take 1 tablet by mouth daily.     Multiple Vitamins-Calcium (ONE-A-DAY WOMENS PO) Take by mouth daily.     naltrexone (DEPADE) 50 MG tablet Take  25 mg by mouth daily.     semaglutide -weight management (WEGOVY ) 2.4 MG/0.75ML SOAJ SQ injection Inject 2.4 mg into the skin once a week. 9 mL 0   Semaglutide -Weight Management (WEGOVY ) 2.4 MG/0.75ML SOAJ Inject 2.4 mg into the skin once a week. 9 mL 0   tamoxifen  (NOLVADEX ) 10 MG tablet Take 0.5 tablets (5 mg total) by mouth daily. (Patient not taking: Reported on 11/28/2023) 45 tablet 3   traZODone (DESYREL) 50 MG tablet Take 50 mg by mouth at bedtime.     triamcinolone lotion (KENALOG) 0.1 %      No current facility-administered medications for this visit.     PHYSICAL EXAMINATION: ECOG PERFORMANCE STATUS: 0 - Asymptomatic  Vitals:   12/17/23 1423  BP: 131/60  Pulse: (!) 58  Resp: 16  Temp: 98.3 F (36.8 C)  SpO2: 99%     Filed Weights   12/17/23 1423  Weight: 193 lb 12.8 oz (87.9 kg)    GENERAL:alert, no distress and comfortable Breast: Bilateral breasts inspected and palpated.  Heterogeneously dense breasts with scattered cysts.  No overtly palpable masses or regional adenopathy.  CTA bilaterally, RRR No LE swelling  LABORATORY DATA:  I have reviewed the data as listed Lab Results  Component Value Date   WBC 14.0 (H) 12/25/2021   HGB 12.7 12/25/2021   HCT 38.8 12/25/2021   MCV 88.2 12/25/2021   PLT 280  12/25/2021     Chemistry      Component Value Date/Time   NA 135 08/07/2022 1412   K 3.5 08/07/2022 1412   CL 104 08/07/2022 1412   CO2 24 08/07/2022 1412   BUN 8 08/07/2022 1412   CREATININE 0.83 08/07/2022 1412      Component Value Date/Time   CALCIUM 8.7 (L) 08/07/2022 1412   ALKPHOS 79 12/25/2021 2224   AST 97 (H) 12/25/2021 2224   ALT 64 (H) 12/25/2021 2224   BILITOT 0.8 12/25/2021 2224       RADIOGRAPHIC STUDIES: I have personally reviewed the radiological images as listed and agreed with the findings in the report. No results found.   All questions were answered. The patient knows to call the clinic with any problems, questions or  concerns. I spent 30  minutes in the care of this patient including H and P, review of records, counseling and coordination of care.     Amber Stalls, MD 12/17/2023 2:30 PM

## 2023-12-20 ENCOUNTER — Ambulatory Visit
Admission: RE | Admit: 2023-12-20 | Discharge: 2023-12-20 | Disposition: A | Discharge status: Home / Self Care | Source: Ambulatory Visit | Attending: Hematology and Oncology | Admitting: Hematology and Oncology

## 2023-12-20 DIAGNOSIS — Z9189 Other specified personal risk factors, not elsewhere classified: Secondary | ICD-10-CM

## 2024-01-09 ENCOUNTER — Ambulatory Visit: Admitting: Obstetrics & Gynecology

## 2024-01-09 ENCOUNTER — Encounter: Payer: Self-pay | Admitting: Obstetrics & Gynecology

## 2024-01-09 VITALS — BP 148/83 | HR 67 | Ht 63.0 in | Wt 194.6 lb

## 2024-01-09 DIAGNOSIS — Z30431 Encounter for routine checking of intrauterine contraceptive device: Secondary | ICD-10-CM

## 2024-01-09 NOTE — Progress Notes (Signed)
 GYN VISIT Patient name: Kathy White MRN [id number]  Date of birth: [birth year] Chief Complaint:   Follow-up (Iud string check)  History of Present Illness:   Kathy White is a 43 y.o. G27P0101 female being seen today for IUD follow up.     Had a few days of bleeding/cramping for a few days and then resolved.  No problems since then.   Last month- had a moderate to heavy period lasted for about a week- only using pantyliner.  This month has not had a period.  Patient's last menstrual period was 11/25/2023 (approximate).    Review of Systems:   Pertinent items are noted in HPI Denies fever/chills, dizziness, headaches, visual disturbances, fatigue, shortness of breath, chest pain, abdominal pain, vomiting, no problems with periods, bowel movements, urination, or intercourse unless otherwise stated above.  Pertinent History Reviewed:   Past Surgical History:  Procedure Laterality Date   BREAST BIOPSY  08/07/2022   MM RT RADIOACTIVE SEED LOC MAMMO GUIDE 08/07/2022 [facility] MAMMOGRAPHY   BREAST LUMPECTOMY WITH RADIOACTIVE SEED LOCALIZATION Right 08/08/2022   Procedure: RIGHT BREAST LUMPECTOMY WITH RADIOACTIVE SEED LOCALIZATION;  Surgeon: Curvin Deward MOULD, MD;  Location: [facility];  Service: General;  Laterality: Right;   CARPAL TUNNEL RELEASE Right 04/28/2021   CERVICAL BIOPSY  W/ LOOP ELECTRODE EXCISION     CESAREAN SECTION  11/14/2004   EXAM UNDER ANESTHESIA WITH MANIPULATION OF KNEE Left 03/22/2015   Procedure: LEFT KNEE EXAM UNDER ANESTHESIA  ;  Surgeon: Lamar Collet, MD;  Location: [facility] [place];  Service: Orthopedics;  Laterality: Left;   HAND WOUND EXPLORATION AND TENDON REPAIR'S Right 10/12/2014   right ring and index fingers   KNEE ARTHROSCOPY W/ PARTIAL MEDIAL MENISCECTOMY Left 09/23/2021   KNEE ARTHROSCOPY WITH ANTERIOR CRUCIATE LIGAMENT (ACL) REPAIR WITH HAMSTRING GRAFT Left 03/22/2015   Procedure: LEFT KNEE ARTHROSCOPY WITH ANTERIOR CRUCIATE  LIGAMENT (ACL) AUTOGRAFT RECONSTRUCTION;  Surgeon: Lamar Collet, MD;  Location: [facility] [place];  Service: Orthopedics;  Laterality: Left;  ANESTHESIA: GENERAL/ABDUCTOR CANAL BLOCK   KNEE SURGERY  09/2021   LAPAROSCOPIC CHOLECYSTECTOMY  12/20/2004   LAPAROSCOPIC GASTRIC BAND REMOVAL WITH LAPAROSCOPIC GASTRIC SLEEVE RESECTION  09/09/2017   MENISECTOMY Left 03/22/2015   Procedure: PARTIAL LEFT KNEE  LATERAL MENISECTOMY, CHONDROPLASTY;  Surgeon: Lamar Collet, MD;  Location: [facility];  Service: Orthopedics;  Laterality: Left;   NERVE, TENDON AND ARTERY REPAIR Right 10/12/2014   Procedure: RIGHT RING FINGER AND RIGHT SMALL FINGER WOUND EXPLORATION WITH TENDON REPAIRS;  Surgeon: Prentice Pagan, MD;  [facility];  Service: Orthopedics;  Laterality: Right;   SYNOVECTOMY Left 08/23/2022   Procedure: SYNOVECTOMY;  Surgeon: Cristy Bonner DASEN, MD;  Location: [facility];  Service: Orthopedics;  Laterality: Left;   WISDOM TOOTH EXTRACTION      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   Hematuria 04/21/2013   History of abnormal cervical Pap smear    History of HPV infection  Hypertension    IBS (irritable bowel syndrome)    Left ACL tear    Migraine    OA (osteoarthritis) of knee    Obesity    Pain of joint of left ankle and foot 10/08/2019   PCO (polycystic ovaries)    Pelvic pain 09/16/2018   S/P ACL reconstruction 03/22/2015   Stiffness of left knee 09/25/2021   Vaginal Pap smear, abnormal    Vitamin D  deficiency    Reviewed problem list, medications and allergies. Physical Assessment:   Vitals:   01/09/24 1514 01/09/24 1517  BP: (!) 145/90 (!) 148/83  Pulse: 67   Weight: 194 lb 9.6 oz (88.3 kg)   Height: 5' 3 (1.6 m)   Body mass index is 34.47 kg/m.       Physical Examination:   General appearance: alert, well appearing, and in no distress  Psych: mood appropriate, normal affect  Skin: warm & dry   Cardiovascular: normal heart rate noted  Respiratory: normal respiratory effort, no distress  Abdomen: soft, non-tender   Pelvic: VULVA: normal appearing vulva with no masses, tenderness or lesions, VAGINA: normal appearing vagina with normal color and discharge, no lesions, CERVIX: normal appearing cervix without discharge or lesions- IUD strings visualized  Extremities: no edema   Chaperone: pt declined    Assessment & Plan:  1) IUD check up -device appears in proper location -encouraged self check every few months -f/u for annual next year   No orders of the defined types were placed in this encounter.   Return in about 1 year (around 01/08/2025) for Annual.   Taniqua Issa, DO Attending Obstetrician & Gynecologist, [facility] for [facility], [facility]

## 2024-04-08 ENCOUNTER — Ambulatory Visit (HOSPITAL_COMMUNITY)

## 2024-04-08 DIAGNOSIS — F411 Generalized anxiety disorder: Secondary | ICD-10-CM
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
## 2024-04-13 ENCOUNTER — Other Ambulatory Visit (HOSPITAL_COMMUNITY): Payer: Self-pay

## 2024-04-13 MED ORDER — WEGOVY 1.7 MG/0.75ML ~~LOC~~ SOAJ
1.7000 mg | SUBCUTANEOUS | 0 refills | Status: AC
  Filled 2024-04-13: qty 3, 28d supply, fill #0

## 2024-04-13 MED ORDER — WEGOVY 2.4 MG/0.75ML ~~LOC~~ SOAJ: 2.4000 mg | SUBCUTANEOUS | 0 refills | Status: AC

## 2024-04-22 ENCOUNTER — Ambulatory Visit (HOSPITAL_COMMUNITY)

## 2024-05-25 ENCOUNTER — Other Ambulatory Visit

## 2024-12-16 ENCOUNTER — Ambulatory Visit: Admitting: Hematology and Oncology
